# Patient Record
Sex: Female | Born: 1990 | Race: White | Hispanic: No | Marital: Married | Smoking: Never smoker
Health system: Southern US, Community
[De-identification: ages and names within clinical notes are randomized; demographics above are authoritative.]

## PROBLEM LIST (undated history)

## (undated) DIAGNOSIS — F419 Anxiety disorder, unspecified: Secondary | ICD-10-CM

## (undated) DIAGNOSIS — N189 Chronic kidney disease, unspecified: Secondary | ICD-10-CM

## (undated) DIAGNOSIS — K219 Gastro-esophageal reflux disease without esophagitis: Secondary | ICD-10-CM

## (undated) DIAGNOSIS — F319 Bipolar disorder, unspecified: Secondary | ICD-10-CM

## (undated) DIAGNOSIS — T7840XA Allergy, unspecified, initial encounter: Secondary | ICD-10-CM

## (undated) DIAGNOSIS — I10 Essential (primary) hypertension: Secondary | ICD-10-CM

## (undated) HISTORY — PX: THYROID CYST EXCISION: SHX2511

## (undated) HISTORY — DX: Essential (primary) hypertension: I10

## (undated) HISTORY — DX: Anxiety disorder, unspecified: F41.9

## (undated) HISTORY — DX: Chronic kidney disease, unspecified: N18.9

## (undated) HISTORY — DX: Allergy, unspecified, initial encounter: T78.40XA

---

## 2013-07-03 ENCOUNTER — Other Ambulatory Visit (HOSPITAL_COMMUNITY)
Admission: RE | Admit: 2013-07-03 | Discharge: 2013-07-03 | Disposition: A | Discharge status: Home / Self Care | Payer: Self-pay | Source: Ambulatory Visit | Attending: Family Medicine | Admitting: Family Medicine

## 2013-07-03 ENCOUNTER — Other Ambulatory Visit: Payer: Self-pay | Admitting: Family Medicine

## 2013-07-03 DIAGNOSIS — Z0000 Encounter for general adult medical examination without abnormal findings: Secondary | ICD-10-CM | POA: Insufficient documentation

## 2015-12-01 DIAGNOSIS — F302 Manic episode, severe with psychotic symptoms: Secondary | ICD-10-CM | POA: Insufficient documentation

## 2016-10-30 ENCOUNTER — Encounter (HOSPITAL_COMMUNITY): Payer: Self-pay | Admitting: Emergency Medicine

## 2016-10-30 ENCOUNTER — Emergency Department (HOSPITAL_COMMUNITY)
Admission: EM | Admit: 2016-10-30 | Discharge: 2016-10-31 | Disposition: A | Discharge status: Home / Self Care | Payer: BLUE CROSS/BLUE SHIELD | Attending: Emergency Medicine | Admitting: Emergency Medicine

## 2016-10-30 DIAGNOSIS — F3110 Bipolar disorder, current episode manic without psychotic features, unspecified: Secondary | ICD-10-CM | POA: Diagnosis not present

## 2016-10-30 DIAGNOSIS — R45851 Suicidal ideations: Secondary | ICD-10-CM | POA: Diagnosis not present

## 2016-10-30 DIAGNOSIS — Z046 Encounter for general psychiatric examination, requested by authority: Secondary | ICD-10-CM | POA: Diagnosis present

## 2016-10-30 DIAGNOSIS — Z5181 Encounter for therapeutic drug level monitoring: Secondary | ICD-10-CM | POA: Diagnosis not present

## 2016-10-30 DIAGNOSIS — F1099 Alcohol use, unspecified with unspecified alcohol-induced disorder: Secondary | ICD-10-CM | POA: Diagnosis not present

## 2016-10-30 DIAGNOSIS — F3160 Bipolar disorder, current episode mixed, unspecified: Secondary | ICD-10-CM | POA: Diagnosis present

## 2016-10-30 DIAGNOSIS — F3163 Bipolar disorder, current episode mixed, severe, without psychotic features: Secondary | ICD-10-CM | POA: Diagnosis not present

## 2016-10-30 DIAGNOSIS — Z79899 Other long term (current) drug therapy: Secondary | ICD-10-CM | POA: Diagnosis not present

## 2016-10-30 DIAGNOSIS — F319 Bipolar disorder, unspecified: Secondary | ICD-10-CM

## 2016-10-30 LAB — COMPREHENSIVE METABOLIC PANEL
ALT: 17 U/L (ref 14–54)
AST: 23 U/L (ref 15–41)
Albumin: 3.8 g/dL (ref 3.5–5.0)
Alkaline Phosphatase: 44 U/L (ref 38–126)
Anion gap: 7 (ref 5–15)
BUN: 11 mg/dL (ref 6–20)
CO2: 27 mmol/L (ref 22–32)
Calcium: 8.6 mg/dL — ABNORMAL LOW (ref 8.9–10.3)
Chloride: 103 mmol/L (ref 101–111)
Creatinine, Ser: 0.6 mg/dL (ref 0.44–1.00)
GFR calc Af Amer: >60 mL/min (ref >60)
GFR calc non Af Amer: >60 mL/min (ref >60)
Glucose, Bld: 110 mg/dL — ABNORMAL HIGH (ref 65–99)
Potassium: 3.5 mmol/L (ref 3.5–5.1)
Sodium: 137 mmol/L (ref 135–145)
Total Bilirubin: 0.4 mg/dL (ref 0.3–1.2)
Total Protein: 6.5 g/dL (ref 6.5–8.1)

## 2016-10-30 LAB — ETHANOL: Alcohol, Ethyl (B): <5 mg/dL (ref <5)

## 2016-10-30 LAB — RAPID URINE DRUG SCREEN, HOSP PERFORMED
Amphetamines: NOT DETECTED
Barbiturates: NOT DETECTED
Benzodiazepines: POSITIVE — AB
Cocaine: NOT DETECTED
Opiates: NOT DETECTED
Tetrahydrocannabinol: NOT DETECTED

## 2016-10-30 LAB — PREGNANCY, URINE: Preg Test, Ur: NEGATIVE

## 2016-10-30 LAB — CBC
HCT: 37.1 % (ref 36.0–46.0)
Hemoglobin: 12.9 g/dL (ref 12.0–15.0)
MCH: 30.4 pg (ref 26.0–34.0)
MCHC: 34.8 g/dL (ref 30.0–36.0)
MCV: 87.5 fL (ref 78.0–100.0)
Platelets: 179 10*3/uL (ref 150–400)
RBC: 4.24 MIL/uL (ref 3.87–5.11)
RDW: 12.9 % (ref 11.5–15.5)
WBC: 5.2 10*3/uL (ref 4.0–10.5)

## 2016-10-30 LAB — SALICYLATE LEVEL: Salicylate Lvl: <7 mg/dL (ref 2.8–30.0)

## 2016-10-30 LAB — TSH: TSH: 1.184 u[IU]/mL (ref 0.350–4.500)

## 2016-10-30 LAB — ACETAMINOPHEN LEVEL: Acetaminophen (Tylenol), Serum: <10 ug/mL — ABNORMAL LOW (ref 10–30)

## 2016-10-30 MED ORDER — ONDANSETRON HCL 4 MG PO TABS: 4.0000 mg | ORAL_TABLET | Freq: Three times a day (TID) | ORAL | Status: DC | PRN

## 2016-10-30 MED ORDER — IBUPROFEN 200 MG PO TABS: 600.0000 mg | ORAL_TABLET | Freq: Three times a day (TID) | ORAL | Status: DC | PRN

## 2016-10-30 MED ORDER — ACETAMINOPHEN 325 MG PO TABS: 650.0000 mg | ORAL_TABLET | ORAL | Status: DC | PRN

## 2016-10-30 MED ORDER — NICOTINE 21 MG/24HR TD PT24: 21.0000 mg | MEDICATED_PATCH | Freq: Every day | TRANSDERMAL | Status: DC

## 2016-10-30 MED ORDER — ZOLPIDEM TARTRATE 5 MG PO TABS: 5.0000 mg | ORAL_TABLET | Freq: Every evening | ORAL | Status: DC | PRN

## 2016-10-30 NOTE — ED Triage Notes (Signed)
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 She states that recently she began feeling very homesick and noticing a change in her behaviors towards her roommate. Pt is tearful with slurred speech. She states she is unsure what is making her have these behaviors. Pt reports family history of bipolar disorder.

## 2016-10-30 NOTE — ED Provider Notes (Signed)
[facility]-EMERGENCY DEPT Provider Note   CSN: [id number] Arrival date & time: 10/30/16  1912     History   Chief Complaint Chief Complaint  Patient presents with  . Psychiatric Evaluation    HPI Tracy Russo is a 25 y.o. female.  HPI  25 year old female brought in for psychiatric evaluation. 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 She tells she's not sure what really happened what was a part of dreams but she was diagnosed with bipolar disorder. She states she doesn't still feel right and her parents encourage her to be checked out here and her doctor that was on the trip with her told her she needed to be admitted to the psychiatric unit. She denies suicidal ideations. She states she is on a medicine for bipolar disorder but doesn't know what it is. Her mom has been giving her the pills. History is limited as the patient is very tangential and does not stay on topic. States she does not feel acutely ill, no other complaints  History reviewed. No pertinent past medical history.  There are no active problems to display for this patient.   History reviewed. No pertinent surgical history.  OB History    No data available       Home Medications    Prior to Admission medications   Not on File    Family History No family history on file.  Social History Social History  Substance Use Topics  . Smoking status: Never Smoker  . Smokeless tobacco: Never Used  . Alcohol use Yes     Comment: occasional drinker, less than 2 drinks per week     Allergies   Patient has no known allergies.   Review of Systems Review of Systems  Constitutional: Negative for fever.  Respiratory: Negative for shortness of breath.   Cardiovascular: Negative for chest pain.  Gastrointestinal: Negative for vomiting.  Psychiatric/Behavioral: Positive for behavioral problems. The patient is  nervous/anxious.   All other systems reviewed and are negative.    Physical Exam Updated Vital Signs BP 111/87 (BP Location: Right Arm)   Pulse 92   Temp 98.5 F (36.9 C) (Oral)   Resp 20   Ht 5\' 7"  (1.702 m)   Wt 147 lb 9 oz (66.9 kg)   LMP 10/14/2016   SpO2 100%   BMI 23.11 kg/m   Physical Exam  Constitutional: She is oriented to person, place, and time. She appears well-developed and well-nourished. No distress.  HENT:  Head: Normocephalic and atraumatic.  Right Ear: External ear normal.  Left Ear: External ear normal.  Nose: Nose normal.  Eyes: Right eye exhibits no discharge. Left eye exhibits no discharge.  Cardiovascular: Normal rate, regular rhythm and normal heart sounds.   Pulmonary/Chest: Effort normal and breath sounds normal.  Abdominal: Soft. There is no tenderness.  Neurological: She is alert and oriented to person, place, and time.  Skin: Skin is warm and dry. She is not diaphoretic.  Psychiatric: Her mood appears anxious. Her speech is rapid and/or pressured and tangential. Her speech is not slurred. She expresses no suicidal ideation.  Nursing note and vitals reviewed.    ED Treatments / Results  Labs (all labs ordered are listed, but only abnormal results are displayed) Labs Reviewed  COMPREHENSIVE METABOLIC PANEL - Abnormal; Notable for the following:       Result Value  Glucose, Bld 110 (*)    Calcium 8.6 (*)    All other components within normal limits  RAPID URINE DRUG SCREEN, HOSP PERFORMED - Abnormal; Notable for the following:    Benzodiazepines POSITIVE (*)    All other components within normal limits  ACETAMINOPHEN LEVEL - Abnormal; Notable for the following:    Acetaminophen (Tylenol), Serum <10 (*)    All other components within normal limits  ETHANOL  CBC  SALICYLATE LEVEL  TSH  PREGNANCY, URINE  POC URINE PREG, ED    EKG  EKG Interpretation None       Radiology No results found.  Procedures Procedures (including  critical care time)  Medications Ordered in ED Medications  ondansetron (ZOFRAN) tablet 4 mg (not administered)  nicotine (NICODERM CQ - dosed in mg/24 hours) patch 21 mg (not administered)  zolpidem (AMBIEN) tablet 5 mg (not administered)  ibuprofen (ADVIL,MOTRIN) tablet 600 mg (not administered)  acetaminophen (TYLENOL) tablet 650 mg (not administered)     Initial Impression / Assessment and Plan / ED Course  I have reviewed the triage vital signs and the nursing notes.  Pertinent labs & imaging results that were available during my care of the patient were reviewed by me and considered in my medical decision making (see chart for details).  Clinical Course as of Oct 31 6  Fri Oct 30, 2016  2111 Consult psych. Overall appears well but appears manic with pressured rapid speech and tangential thoughts. No SI  [SG]  2238 Screening labs unremarkable. Patient will be placed in psych holding while awaiting TTS.  [SG]    Clinical Course User Index [SG] Pricilla LovelessScott Akshara Blumenthal, MD    I believe patient has mania/bipolar disorder. She is stable. Psych consult/dispo per them.  Final Clinical Impressions(s) / ED Diagnoses   Final diagnoses:  None    New Prescriptions New Prescriptions   No medications on file     Pricilla LovelessScott Niccolo Burggraf, MD 10/31/16 0007

## 2016-10-30 NOTE — ED Notes (Signed)
Pt. In burgundy scrubs. Pt. And belongings wanded by security. Pt. Has 1 belongings bag. Pt. Has 1 pr. Black shoes, 1 gray/ purple sweater, 1 white/black sports bra, 1 black t-shirt, 1 jean, and 1 pr black socks. Pt. Belongings locked up in [employer]. Pt. Has glasses at bedside. No wallet, no money and no cell phone.

## 2016-10-30 NOTE — ED Notes (Signed)
Pt. Transferred to [facility] from ED to room 36 after screening for contraband. Report to include Situation, Background, Assessment and Recommendations from RN. Pt. Oriented to unit including Q15 minute rounds as well as the security cameras for their protection. Patient is alert and oriented, warm and dry in no acute distress. Patient denies SI, HI, and AVH. Pt. Encouraged to let me know if needs arise. 

## 2016-10-30 NOTE — BH Assessment (Addendum)
Assessment Note  Tracy BachKaren H Russo is an 25 y.o. female.   Patient reports being brought into the ED by parents (mother and father) because of changes in behaviors.  Patient was sleep upon entering her room but woke enough to answer some questions.  Patient never opened her eyes and she rambled with Russo muffled voice.  She reports recently returning from [place] where she experienced Russo behavior change and diagnosed with Bipolar disorder by Russo doctor.  She reports getting in Russo verbal altercation with her roommate later becoming very nervous. She denies history of mental health treatment although reporting some symptoms of increased moods then low moods. She reports mother has Russo diagnosis of Bipolar Disorder and is currently concerned the patient has similar behaviors.   Patient denies SI/HI and self-injurious behaviors.  She reports struggling with distinguishing reality.    This writer attempted to contact the patient's only emergency contact with no success.    This Clinical research associatewriter consulted with Barbara CowerJason, FNP it is recommended to refer for inpatient hospitalization for stabilization and safety.     Diagnosis: Mood disorder, unspecified  Past Medical History: History reviewed. No pertinent past medical history.  History reviewed. No pertinent surgical history.  Family History: No family history on file.  Social History:  reports that she has never smoked. She has never used smokeless tobacco. She reports that she drinks alcohol. Her drug history is not on file.  Additional Social History:  Alcohol / Drug Use Pain Medications: see chart Prescriptions: see chart Over the Counter: see chart History of alcohol / drug use?: No history of alcohol / drug abuse (none reported)  CIWA: CIWA-Ar BP: 111/87 Pulse Rate: 92 COWS:    Allergies: No Known Allergies  Home Medications:  (Not in Russo hospital admission)  OB/GYN Status:  Patient's last menstrual period was 10/14/2016.  General Assessment Data Location  of Assessment: [facility] ED TTS Assessment: In system Is this Russo Tele or Face-to-Face Assessment?: Face-to-Face Is this an Initial Assessment or Russo Re-assessment for this encounter?: Initial Assessment Marital status: Single Maiden name: na Is patient pregnant?: No Pregnancy Status: No Living Arrangements: Parent Can pt return to current living arrangement?: Yes Admission Status: Voluntary Is patient capable of signing voluntary admission?: Yes Referral Source: Self/Family/Friend Insurance type: none  Medical Screening Exam [facility] Walk[facility] ONLY) Medical Exam completed: Yes  Crisis Care Plan Living Arrangements: Parent Name of Psychiatrist: none reported Name of Therapist: none reported  Education Status Is patient currently in school?:  (unknown) Highest grade of school patient has completed: unknown Name of school: unknown Contact person: na  Risk to self with the past 6 months Suicidal Ideation: No-Not Currently/Within Last 6 Months Has patient been Russo risk to self within the past 6 months prior to admission? : No Suicidal Intent: No-Not Currently/Within Last 6 Months Has patient had any suicidal intent within the past 6 months prior to admission? : No Is patient at risk for suicide?: No Suicidal Plan?: No-Not Currently/Within Last 6 Months Has patient had any suicidal plan within the past 6 months prior to admission? : No Access to Means: No What has been your use of drugs/alcohol within the last 12 months?: none reported Previous Attempts/Gestures: No (none reported) How many times?: 0 Other Self Harm Risks: none reported Intentional Self Injurious Behavior: None Family Suicide History: Unknown Recent stressful life event(s): Other (Comment), Loss (Comment), Conflict (Comment) (Recently returned from [place]) Persecutory voices/beliefs?: No Depression: Yes Depression Symptoms: Feeling angry/irritable, Isolating, Tearfulness Substance abuse history  and/or treatment for substance  abuse?: No  Risk to Others within the past 6 months Homicidal Ideation: No-Not Currently/Within Last 6 Months Does patient have any lifetime risk of violence toward others beyond the six months prior to admission? : No Thoughts of Harm to Others: No-Not Currently Present/Within Last 6 Months Current Homicidal Intent: No-Not Currently/Within Last 6 Months Current Homicidal Plan: No-Not Currently/Within Last 6 Months Access to Homicidal Means: No Identified Victim: na History of harm to others?: No Assessment of Violence: None Noted Violent Behavior Description: none reported Does patient have access to weapons?: No Criminal Charges Pending?: No Does patient have Russo court date: No Is patient on probation?: No  Psychosis Hallucinations:  (Pt reports unable to tell reality anymore) Delusions:  (unable to assess)  Mental Status Report Appearance/Hygiene: In scrubs Eye Contact: Poor Motor Activity: Freedom of movement Speech: Slurred, Rapid, Pressured Level of Consciousness: Sleeping, Drowsy Mood: Anxious Affect: Anxious Anxiety Level: Moderate Thought Processes: Tangential Judgement: Impaired Orientation: Person Obsessive Compulsive Thoughts/Behaviors: None  Cognitive Functioning Concentration: Poor Memory: Unable to Assess IQ: Average Insight: Poor Impulse Control: Poor Appetite: Poor Weight Loss: 0 Sleep: Increased Total Hours of Sleep: 9 Vegetative Symptoms: None  ADLScreening [facility] Assessment Services) Patient's cognitive ability adequate to safely complete daily activities?: Yes Patient able to express need for assistance with ADLs?: Yes Independently performs ADLs?: Yes (appropriate for developmental age)  Prior Inpatient Therapy Prior Inpatient Therapy: No  Prior Outpatient Therapy Prior Outpatient Therapy: No Does patient have an ACCT team?: No Does patient have Intensive In-House Services?  : No Does patient have [facility] services? : No Does patient have  P4CC services?: No  ADL Screening (condition at time of admission) Patient's cognitive ability adequate to safely complete daily activities?: Yes Patient able to express need for assistance with ADLs?: Yes Independently performs ADLs?: Yes (appropriate for developmental age)       Abuse/Neglect Assessment (Assessment to be complete while patient is alone) Physical Abuse: Denies Verbal Abuse: Denies Sexual Abuse: Denies Exploitation of patient/patient's resources: Denies Self-Neglect: Denies Values / Beliefs Cultural Requests During Hospitalization: None Spiritual Requests During Hospitalization: None Consults Spiritual Care Consult Needed: No Social Work Consult Needed: No Merchant navy officerAdvance Directives (For Healthcare) Does Patient Have Russo Medical Advance Directive?: No    Additional Information 1:1 In Past 12 Months?: No CIRT Risk: No Elopement Risk: No Does patient have medical clearance?: Yes     Disposition:  Disposition Initial Assessment Completed for this Encounter: Yes Disposition of Patient: Inpatient treatment program Type of inpatient treatment program: Adult  On Site Evaluation by:   Reviewed with Physician:    Maryelizabeth Rowanorbett, Tracy Russo 10/30/2016 11:25 PM

## 2016-10-31 ENCOUNTER — Encounter (HOSPITAL_COMMUNITY): Payer: Self-pay | Admitting: *Deleted

## 2016-10-31 ENCOUNTER — Inpatient Hospital Stay (HOSPITAL_COMMUNITY)
Admission: AD | Admit: 2016-10-31 | Discharge: 2016-11-15 | DRG: 885 | Disposition: A | Discharge status: Home / Self Care | Payer: BLUE CROSS/BLUE SHIELD | Source: Intra-hospital | Attending: Psychiatry | Admitting: Psychiatry

## 2016-10-31 DIAGNOSIS — F3162 Bipolar disorder, current episode mixed, moderate: Secondary | ICD-10-CM | POA: Diagnosis not present

## 2016-10-31 DIAGNOSIS — Z818 Family history of other mental and behavioral disorders: Secondary | ICD-10-CM

## 2016-10-31 DIAGNOSIS — Z79899 Other long term (current) drug therapy: Secondary | ICD-10-CM | POA: Diagnosis not present

## 2016-10-31 DIAGNOSIS — R45851 Suicidal ideations: Secondary | ICD-10-CM | POA: Diagnosis not present

## 2016-10-31 DIAGNOSIS — F3110 Bipolar disorder, current episode manic without psychotic features, unspecified: Secondary | ICD-10-CM | POA: Diagnosis not present

## 2016-10-31 DIAGNOSIS — F428 Other obsessive-compulsive disorder: Secondary | ICD-10-CM | POA: Diagnosis present

## 2016-10-31 DIAGNOSIS — F429 Obsessive-compulsive disorder, unspecified: Secondary | ICD-10-CM | POA: Diagnosis not present

## 2016-10-31 DIAGNOSIS — F312 Bipolar disorder, current episode manic severe with psychotic features: Secondary | ICD-10-CM | POA: Diagnosis present

## 2016-10-31 DIAGNOSIS — F319 Bipolar disorder, unspecified: Secondary | ICD-10-CM | POA: Clinically undetermined

## 2016-10-31 DIAGNOSIS — F3160 Bipolar disorder, current episode mixed, unspecified: Secondary | ICD-10-CM | POA: Diagnosis present

## 2016-10-31 DIAGNOSIS — K5900 Constipation, unspecified: Secondary | ICD-10-CM | POA: Diagnosis not present

## 2016-10-31 DIAGNOSIS — K649 Unspecified hemorrhoids: Secondary | ICD-10-CM | POA: Diagnosis present

## 2016-10-31 DIAGNOSIS — F411 Generalized anxiety disorder: Secondary | ICD-10-CM | POA: Diagnosis present

## 2016-10-31 DIAGNOSIS — F3163 Bipolar disorder, current episode mixed, severe, without psychotic features: Secondary | ICD-10-CM | POA: Diagnosis not present

## 2016-10-31 DIAGNOSIS — F1099 Alcohol use, unspecified with unspecified alcohol-induced disorder: Secondary | ICD-10-CM

## 2016-10-31 DIAGNOSIS — Z8249 Family history of ischemic heart disease and other diseases of the circulatory system: Secondary | ICD-10-CM

## 2016-10-31 MED ORDER — MAGNESIUM HYDROXIDE 400 MG/5ML PO SUSP
30.0000 mL | Freq: Every day | ORAL | Status: DC | PRN
  Administered 2016-11-01 – 2016-11-06 (×2): 30 mL via ORAL
  Filled 2016-10-31 (×2): qty 30

## 2016-10-31 MED ORDER — ALUM & MAG HYDROXIDE-SIMETH 200-200-20 MG/5ML PO SUSP: 30.0000 mL | ORAL | Status: DC | PRN

## 2016-10-31 MED ORDER — INFLUENZA VAC SPLIT QUAD 0.5 ML IM SUSY
0.5000 mL | PREFILLED_SYRINGE | INTRAMUSCULAR | Status: DC
  Filled 2016-10-31: qty 0.5

## 2016-10-31 MED ORDER — QUETIAPINE FUMARATE 25 MG PO TABS
25.0000 mg | ORAL_TABLET | Freq: Three times a day (TID) | ORAL | Status: DC
  Administered 2016-10-31 – 2016-11-02 (×6): 25 mg via ORAL
  Filled 2016-10-31 (×11): qty 1

## 2016-10-31 MED ORDER — QUETIAPINE FUMARATE 25 MG PO TABS
25.0000 mg | ORAL_TABLET | Freq: Three times a day (TID) | ORAL | Status: DC
  Administered 2016-10-31 (×2): 25 mg via ORAL
  Filled 2016-10-31 (×2): qty 1

## 2016-10-31 MED ORDER — NICOTINE 21 MG/24HR TD PT24
21.0000 mg | MEDICATED_PATCH | Freq: Every day | TRANSDERMAL | Status: DC
  Administered 2016-11-01 – 2016-11-12 (×7): 21 mg via TRANSDERMAL
  Filled 2016-10-31 (×18): qty 1

## 2016-10-31 MED ORDER — WHITE PETROLATUM GEL
Status: DC | PRN
  Administered 2016-10-31: 0.2 via TOPICAL
  Filled 2016-10-31: qty 28.35

## 2016-10-31 MED ORDER — WHITE PETROLATUM GEL: Status: DC | PRN

## 2016-10-31 MED ORDER — ONDANSETRON HCL 4 MG PO TABS
4.0000 mg | ORAL_TABLET | Freq: Three times a day (TID) | ORAL | Status: DC | PRN
  Administered 2016-11-03 – 2016-11-12 (×2): 4 mg via ORAL
  Filled 2016-10-31 (×3): qty 1

## 2016-10-31 MED ORDER — IBUPROFEN 600 MG PO TABS
600.0000 mg | ORAL_TABLET | Freq: Three times a day (TID) | ORAL | Status: DC | PRN
  Administered 2016-11-12: 600 mg via ORAL
  Filled 2016-10-31: qty 1

## 2016-10-31 MED ORDER — QUETIAPINE FUMARATE 25 MG PO TABS
ORAL_TABLET | ORAL | Status: AC
  Filled 2016-10-31: qty 1

## 2016-10-31 NOTE — ED Notes (Signed)
Patient noted sleeping in room. No complaints, stable, in no acute distress. Q15 minute rounds and monitoring via Security Cameras to continue.  

## 2016-10-31 NOTE — ED Notes (Signed)
Pt transported to [facility] by [facility] transportation. All belongings returned to pt who signed for same.  

## 2016-10-31 NOTE — Tx Team (Signed)
Initial Treatment Plan 10/31/2016 8:15 PM Tracy Russo AVW:[id number]    PATIENT STRESSORS: Other: Psychosis   PATIENT STRENGTHS: Communication skills Motivation for treatment/growth Physical Health Supportive family/friends   PATIENT IDENTIFIED PROBLEMS: Psychosis  Anxiety  "Deal with anxiety"  "New coping mechanism"  "Better people skills"             DISCHARGE CRITERIA:  Ability to meet basic life and health needs Improved stabilization in mood, thinking, and/or behavior Motivation to continue treatment in a less acute level of care Need for constant or close observation no longer present Verbal commitment to aftercare and medication compliance  PRELIMINARY DISCHARGE PLAN: Outpatient therapy Return to previous living arrangement  PATIENT/FAMILY INVOLVEMENT: This treatment plan has been presented to and reviewed with the patient, Tracy Russo.  The patient and family have been given the opportunity to ask questions and make suggestions.  Carleene OverlieMiddleton, Prakash Kimberling P, RN 10/31/2016, 8:15 PM

## 2016-10-31 NOTE — Consult Note (Signed)
[facility] Consult   Reason for Consult:  Mood swings and bizarre behaviors Referring Physician:  EDP Patient Identification: Tracy Russo MRN:  [id number] Principal Diagnosis: <principal problem not specified> Diagnosis:  There are no active problems to display for this patient.   Total Time spent with patient: 45 minutes  Subjective:   Tracy Russo is a 25 y.o. female patient admitted with bipolar mood swings and benzo's abuse.  HPI:  Tracy Russo is an 25 y.o. female seen at [facility] long emergency department, case discussed with the physician extender, staff RN for this face-to-face psychiatric consultation. Patient presented with mood swings, disturbed sleep, rambling speech, tangential and circumstantial thought process and racing thoughts. Patient becomes nonfunctional since came from [place]. 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 Patient is also has a verbal altercation with her roommate. Patient denies active suicidal/homicidal ideation and auditory or visual hallucinations..  Please review the following behavioral health assessment for more details. Patient reports being brought into the ED by parents (mother and father) because of changes in behaviors.  Patient was sleep upon entering her room but woke enough to answer some questions.  Patient never opened her eyes and she rambled with a muffled voice.  She reports recently returning from [place] where she experienced a behavior change and diagnosed with Bipolar disorder by a doctor.  She reports getting in a verbal altercation with her roommate later becoming very nervous. She denies history of mental health treatment although reporting some symptoms of increased moods then low moods. She reports mother has a diagnosis of Bipolar Disorder and is currently concerned the patient has similar behaviors.   Patient denies SI/HI and self-injurious behaviors.  She reports  struggling with distinguishing reality.    This writer attempted to contact the patient's only emergency contact with no success.    This Probation officer consulted with Corene Cornea, [place] it is recommended to refer for inpatient hospitalization for stabilization and safety.     Diagnosis: Mood disorder, unspecified  Past Medical History: History reviewed. No pertinent past medical history.  History reviewed. No pertinent surgical history.  Family History: No family history on file.  Social History:  reports that she has never smoked. She has never used smokeless tobacco. She reports that she drinks alcohol. Her drug history is not on file.  Past Psychiatric History: None reported.  Risk to Self: Suicidal Ideation: No-Not Currently/Within Last 6 Months Suicidal Intent: No-Not Currently/Within Last 6 Months Is patient at risk for suicide?: No Suicidal Plan?: No-Not Currently/Within Last 6 Months Access to Means: No What has been your use of drugs/alcohol within the last 12 months?: none reported How many times?: 0 Other Self Harm Risks: none reported Intentional Self Injurious Behavior: None Risk to Others: Homicidal Ideation: No-Not Currently/Within Last 6 Months Thoughts of Harm to Others: No-Not Currently Present/Within Last 6 Months Current Homicidal Intent: No-Not Currently/Within Last 6 Months Current Homicidal Plan: No-Not Currently/Within Last 6 Months Access to Homicidal Means: No Identified Victim: na History of harm to others?: No Assessment of Violence: None Noted Violent Behavior Description: none reported Does patient have access to weapons?: No Criminal Charges Pending?: No Does patient have a court date: No Prior Inpatient Therapy: Prior Inpatient Therapy: No Prior Outpatient Therapy: Prior Outpatient Therapy: No Does patient have an ACCT team?: No Does patient have Intensive In-House Services?  : No Does patient have [facility] services? : No Does patient have P4[facility]  services?: No  Past Medical History: History reviewed. No pertinent past medical history. History reviewed. No pertinent surgical history. Family History: No family history on file. Family Psychiatric  History: Bipolar disorder Social History:  History  Alcohol Use  . Yes    Comment: occasional drinker, less than 2 drinks per week     History  Drug use: Unknown    Social History   Social History  . Marital status: Single    Spouse name: N/A  . Number of children: N/A  . Years of education: N/A   Social History Main Topics  . Smoking status: Never Smoker  . Smokeless tobacco: Never Used  . Alcohol use Yes     Comment: occasional drinker, less than 2 drinks per week  . Drug use: Unknown  . Sexual activity: Not Asked   Other Topics Concern  . None   Social History Narrative  . None   Additional Social History:    Allergies:  No Known Allergies  Labs:  Results for orders placed or performed during the hospital encounter of 10/30/16 (from the past 48 hour(s))  Rapid urine drug screen (hospital performed)     Status: Abnormal   Collection Time: 10/30/16  8:29 PM  Result Value Ref Range   Opiates NONE DETECTED NONE DETECTED   Cocaine NONE DETECTED NONE DETECTED   Benzodiazepines POSITIVE (A) NONE DETECTED   Amphetamines NONE DETECTED NONE DETECTED   Tetrahydrocannabinol NONE DETECTED NONE DETECTED   Barbiturates NONE DETECTED NONE DETECTED    Comment:        DRUG SCREEN FOR MEDICAL PURPOSES ONLY.  IF CONFIRMATION IS NEEDED FOR ANY PURPOSE, NOTIFY LAB WITHIN 5 DAYS.        LOWEST DETECTABLE LIMITS FOR URINE DRUG SCREEN Drug Class       Cutoff (ng/mL) Amphetamine      1000 Barbiturate      200 Benzodiazepine   427 Tricyclics       062 Opiates          300 Cocaine          300 THC              50   Pregnancy, urine     Status: None   Collection Time: 10/30/16  8:30 PM  Result Value Ref Range   Preg Test, Ur NEGATIVE NEGATIVE    Comment:        THE  SENSITIVITY OF THIS METHODOLOGY IS >20 mIU/mL.   Comprehensive metabolic panel     Status: Abnormal   Collection Time: 10/30/16  9:44 PM  Result Value Ref Range   Sodium 137 135 - 145 mmol/L   Potassium 3.5 3.5 - 5.1 mmol/L   Chloride 103 101 - 111 mmol/L   CO2 27 22 - 32 mmol/L   Glucose, Bld 110 (H) 65 - 99 mg/dL   BUN 11 6 - 20 mg/dL   Creatinine, Ser 0.60 0.44 - 1.00 mg/dL   Calcium 8.6 (L) 8.9 - 10.3 mg/dL   Total Protein 6.5 6.5 - 8.1 g/dL   Albumin 3.8 3.5 - 5.0 g/dL   AST 23 15 - 41 U/L   ALT 17 14 - 54 U/L   Alkaline Phosphatase 44 38 - 126 U/L   Total Bilirubin 0.4 0.3 - 1.2 mg/dL   GFR calc non Af Amer >60 >60 mL/min   GFR calc Af Amer >60 >60 mL/min    Comment: (NOTE) The eGFR has been calculated using the CKD EPI equation.  This calculation has not been validated in all clinical situations. eGFR's persistently <60 mL/min signify possible Chronic Kidney Disease.    Anion gap 7 5 - 15  Ethanol     Status: None   Collection Time: 10/30/16  9:44 PM  Result Value Ref Range   Alcohol, Ethyl (B) <5 <5 mg/dL    Comment:        LOWEST DETECTABLE LIMIT FOR SERUM ALCOHOL IS 5 mg/dL FOR MEDICAL PURPOSES ONLY   cbc     Status: None   Collection Time: 10/30/16  9:44 PM  Result Value Ref Range   WBC 5.2 4.0 - 10.5 K/uL   RBC 4.24 3.87 - 5.11 MIL/uL   Hemoglobin 12.9 12.0 - 15.0 g/dL   HCT 37.1 36.0 - 46.0 %   MCV 87.5 78.0 - 100.0 fL   MCH 30.4 26.0 - 34.0 pg   MCHC 34.8 30.0 - 36.0 g/dL   RDW 12.9 11.5 - 15.5 %   Platelets 179 150 - 400 K/uL  Acetaminophen level     Status: Abnormal   Collection Time: 10/30/16  9:45 PM  Result Value Ref Range   Acetaminophen (Tylenol), Serum <10 (L) 10 - 30 ug/mL    Comment:        THERAPEUTIC CONCENTRATIONS VARY SIGNIFICANTLY. A RANGE OF 10-30 ug/mL MAY BE AN EFFECTIVE CONCENTRATION FOR MANY PATIENTS. HOWEVER, SOME ARE BEST TREATED AT CONCENTRATIONS OUTSIDE THIS RANGE. ACETAMINOPHEN CONCENTRATIONS >150 ug/mL AT 4 HOURS  AFTER INGESTION AND >50 ug/mL AT 12 HOURS AFTER INGESTION ARE OFTEN ASSOCIATED WITH TOXIC REACTIONS.   Salicylate level     Status: None   Collection Time: 10/30/16  9:45 PM  Result Value Ref Range   Salicylate Lvl <5.9 2.8 - 30.0 mg/dL  TSH     Status: None   Collection Time: 10/30/16  9:45 PM  Result Value Ref Range   TSH 1.184 0.350 - 4.500 uIU/mL    Comment: Performed by a 3rd Generation assay with a functional sensitivity of <=0.01 uIU/mL.    Current Facility-Administered Medications  Medication Dose Route Frequency Provider Last Rate Last Dose  . acetaminophen (TYLENOL) tablet 650 mg  650 mg Oral Q4H PRN Sherwood Gambler, MD      . ibuprofen (ADVIL,MOTRIN) tablet 600 mg  600 mg Oral Q8H PRN Sherwood Gambler, MD      . nicotine (NICODERM CQ - dosed in mg/24 hours) patch 21 mg  21 mg Transdermal Daily Sherwood Gambler, MD      . ondansetron (ZOFRAN) tablet 4 mg  4 mg Oral Q8H PRN Sherwood Gambler, MD      . QUEtiapine (SEROQUEL) tablet 25 mg  25 mg Oral TID Ambrose Finland, MD      . white petrolatum (VASELINE) gel   Topical PRN Benjamine Mola, FNP      . zolpidem (AMBIEN) tablet 5 mg  5 mg Oral QHS PRN Sherwood Gambler, MD       No current outpatient prescriptions on file.    Musculoskeletal: Strength & Muscle Tone: within normal limits Gait & Station: normal Patient leans: N/A  Psychiatric Specialty Exam: Physical Exam Full physical performed in Emergency Department. I have reviewed this assessment and concur with its findings.   ROS depression, mood swings, lack of sleep and disturbed appetite and racing thoughts. No Fever-chills, No Headache, No changes with Vision or hearing, reports vertigo No problems swallowing food or Liquids, No Chest pain, Cough or Shortness of Breath, No Abdominal pain, No Nausea  or Vommitting, Bowel movements are regular, No Blood in stool or Urine, No dysuria, No new skin rashes or bruises, No new joints pains-aches,  No new weakness,  tingling, numbness in any extremity, No recent weight gain or loss, No polyuria, polydypsia or polyphagia,   A full 10 point Review of Systems was done, except as stated above, all other Review of Systems were negative.  Blood pressure 127/73, pulse 86, temperature 98.1 F (36.7 C), temperature source Oral, resp. rate 16, height _0  (1.702 m), weight 66.9 kg (147 lb 9 oz), last menstrual period 10/14/2016, SpO2 99 %.Body mass index is 23.11 kg/m.  General Appearance: Bizarre and Disheveled  Eye Contact:  Fair  Speech:  Pressured  Volume:  Normal  Mood:  Anxious and Depressed  Affect:  Labile  Thought Process:  Irrelevant  Orientation:  Full (Time, Place, and Person)  Thought Content:  Rumination and Tangential  Suicidal Thoughts:  Yes.  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 Patient cannot function herself for the last few weeks and required hospital visit for stabilization. Patient has no previous history of psychiatric treatment but has a history of bipolar disorder in the family.  Patient meet criteria for acute psychiatric hospitalization referred to the TTS for seeking inpatient  psychiatric hospitalization. We start Seroquel 25 mg 3 times daily for mood swings and Anxiety Daily contact with patient to assess and evaluate symptoms and progress in treatment and Medication management  Disposition: Recommend psychiatric Inpatient admission when medically cleared. Supportive therapy provided about ongoing stressors.  Ambrose Finland, MD 10/31/2016 11:48 AM

## 2016-10-31 NOTE — Progress Notes (Signed)
Admission Note:  25 yr old female who presents voluntary, in no acute distress, for the treatment of psychosis. Patient presents with slurred, tangential speech.  Disorganized thinking.  Patient was cooperative with admission process. Patient denies SI and contracts for safety upon admission. Patient reports auditory and visual hallucination.  Patient reports "I feel like I have an antenna radio signal sound that goes through my head.  It shows me things through history and inspires me. Sometimes it's annoying and won't let me sleep".  Patient reports "when I was in [place] I had rage and I realized how powerful my tongue is. I have to learn to control my tongue better".  Per report, patient reported VH "seeing gypsies".  Patient currently lives with her family and identifies her "friends" as her support system.  [facility], patient would like to "deal with anxiety," "New coping mechanisms", and "Better people skills".  Skin was assessed and found to be clear of any abnormal marks apart from bruising to forearms bilateral, back of right leg, and left foot".  Patient searched and no contraband found, POC and unit policies explained and understanding verbalized. Consents obtained. Food and fluids offered and accepted. Patient had no additional questions or concerns.

## 2016-10-31 NOTE — ED Notes (Signed)
Pt's heart rate was 127 to 132 on the dinamap. Recheck manually was 130 for about 30 seconds and then slowed down to 62 bpm. Reported to Renata Capriceonrad, [place]. She is anxious and manic as well.

## 2016-10-31 NOTE — ED Notes (Signed)
[facility] that pt's HR was initially high and then slowed down to 62 as she relaxed, (manual).

## 2016-10-31 NOTE — ED Notes (Signed)
Introduced self to patient/family. Pt oriented to unit expectations.  Assessed pt for:  A) Anxiety &/or agitation: Pt is anxious and tearful. She cries over very small concerns with everything being a crisis. Her speech is tangential and disorganized. Denies SI, but has thoughts of hurting others - but just in a playful way. She said, "I might like to slap someone, but just playing."  She went into the bathroom and without towels or other shower supplies she put her head under the shower. Staff took shower supplies to her and she took a shower appropriately. She said that she has the urge to move the furniture around in her room.   S) Safety: Safety maintained with q-15-minute checks and hourly rounds by staff.  A) ADLs: Pt able to perform ADLs independently.  P) Pick-Up (room cleanliness): Pt's room clean and free of clutter.

## 2016-10-31 NOTE — Progress Notes (Signed)
CSW spoke with patient at bedside. CSW obtained patient's signature on voluntary admission and consent for treatment form and release of information form. CSW provided patient's RN with signed documents.

## 2016-10-31 NOTE — ED Notes (Signed)
Pt states, "I think I am starting to see things that might not be there." She pointed to the corner of the room and said, "I see rowing Gypsies in the corner."  She took Seroquel that was ordered and Vasoline was provided for her chapped lips.

## 2016-10-31 NOTE — ED Notes (Addendum)
Pt is speaking in [place].

## 2016-10-31 NOTE — Progress Notes (Signed)
D   Pt is hyperverbal and intrusive   She asked to sign a 72 hour request for discharge saying my problem is my work and masturbating too much  and I figured it out so I can leave now and start working on that   Pt is having flight of ideas    She is pleasant on approach and is cooperative   Her boundaries are a little loose and needs some reminding when socilaizing with her peers A    Verbal support given   Medications administered and effectiveness monitored    Q 15 min checks  Continue to remind of appropriate boundaries   Allowed her to sign the 72 hour request for discharge R   Pt is safe and responsive to verbal support

## 2016-11-01 DIAGNOSIS — F429 Obsessive-compulsive disorder, unspecified: Secondary | ICD-10-CM

## 2016-11-01 MED ORDER — LORAZEPAM 1 MG PO TABS
ORAL_TABLET | ORAL | Status: AC
  Filled 2016-11-01: qty 1

## 2016-11-01 MED ORDER — HYDROXYZINE HCL 25 MG PO TABS
25.0000 mg | ORAL_TABLET | Freq: Once | ORAL | Status: AC
  Administered 2016-11-01: 25 mg via ORAL

## 2016-11-01 MED ORDER — OLANZAPINE 5 MG PO TBDP
5.0000 mg | ORAL_TABLET | Freq: Once | ORAL | Status: AC
  Administered 2016-11-01: 5 mg via ORAL

## 2016-11-01 MED ORDER — HYDROXYZINE HCL 25 MG PO TABS
ORAL_TABLET | ORAL | Status: AC
  Filled 2016-11-01: qty 1

## 2016-11-01 MED ORDER — LORAZEPAM 1 MG PO TABS
1.0000 mg | ORAL_TABLET | Freq: Once | ORAL | Status: AC
  Administered 2016-11-01: 1 mg via ORAL

## 2016-11-01 MED ORDER — OLANZAPINE 5 MG PO TBDP
5.0000 mg | ORAL_TABLET | Freq: Once | ORAL | Status: AC
  Administered 2016-11-01: 5 mg via ORAL
  Filled 2016-11-01 (×2): qty 1

## 2016-11-01 MED ORDER — OLANZAPINE 5 MG PO TBDP
ORAL_TABLET | ORAL | Status: AC
Start: 2016-11-01 — End: 2016-11-01
  Filled 2016-11-01: qty 1

## 2016-11-01 MED ORDER — QUETIAPINE FUMARATE 100 MG PO TABS
100.0000 mg | ORAL_TABLET | Freq: Every day | ORAL | Status: DC
  Administered 2016-11-01: 100 mg via ORAL
  Filled 2016-11-01 (×3): qty 1

## 2016-11-01 NOTE — [facility] Group Notes (Signed)
[facility] Group Notes:  (Clinical Social Work)  11/01/2016  11:00AM-12:00PM  Summary of Progress/Problems:  The main focus of today's process group was to listen to a variety of genres of music and to identify that different types of music provoke different responses.  The patient then was able to identify personally what was soothing for them, as well as energizing, as well as how patient can personally use this knowledge in sleep habits, with depression, and with other symptoms.  The patient was not present at the beginning of group to say how she felt; however, she expressed at the end of group the overall feeling of "more relaxed."  Type of Therapy:  Music Therapy   Participation Level:  Active  Participation Quality:  Attentive and Sharing  Affect:  Blunted  Cognitive:  Oriented  Insight:  Engaged  Engagement in Therapy:  Engaged  Modes of Intervention:   Activity, Exploration  Ambrose MantleMareida Grossman-Orr, LCSW 11/01/2016

## 2016-11-01 NOTE — Progress Notes (Signed)
Patient ID: Tracy Russo, female   DOB: 07/25/1991, 25 y.o.   MRN: [id number]   EKG completed 11/01/16 and placed on chart. Tracy BalintShalita Tycho Cheramie RN

## 2016-11-01 NOTE — [facility] Suicide Risk Assessment (Signed)
[facility]   Nursing information obtained from:  Patient Demographic factors:  Adolescent or young adult, Caucasian, Unemployed Current Mental Status:  NA Loss Factors:  Loss of significant relationship Historical Factors:  Family history of mental illness or substance abuse Risk Reduction Factors:  Living with another person, especially a relative, Positive social support  Total Time spent with patient: 1.5 hours Principal Problem: <principal problem not specified> Diagnosis:   Patient Active Problem List   Diagnosis Date Noted  . Bipolar disorder, current episode mixed (HCC) [F31.60] 10/31/2016  . Bipolar mixed affective disorder, moderate (HCC) [F31.62] 10/31/2016   Subjective Data: Alert, oriented, hyperverbal, obsessed with cleanliness and mind racing  Continued Clinical Symptoms:  Alcohol Use Disorder Identification Test Final Score (AUDIT): 3 The "Alcohol Use Disorders Identification Test", Guidelines for Use in Primary Care, Second Edition.  World Science writerHealth Organization [facility](WHO). Score between 0-7:  no or low risk or alcohol related problems. Score between 8-15:  moderate risk of alcohol related problems. Score between 16-19:  high risk of alcohol related problems. Score 20 or above:  warrants further diagnostic evaluation for alcohol dependence and treatment.   CLINICAL FACTORS:   Bipolar Disorder:   Depressive phase Obsessive-Compulsive Disorder More than one psychiatric diagnosis Unstable or Poor Therapeutic Relationship Previous Psychiatric Diagnoses and Treatments   Musculoskeletal: Strength & Muscle Tone: within normal limits Gait & Station: normal Patient leans: no lean  Psychiatric Specialty Exam: Physical Exam  HENT:  Head: Normocephalic and atraumatic.    Review of Systems  Constitutional: Negative for fever.  Cardiovascular: Negative for chest pain.  Gastrointestinal: Negative for nausea.  Psychiatric/Behavioral: Positive for  depression. Negative for suicidal ideas. The patient is nervous/anxious.     Blood pressure 118/80, pulse (!) 122, temperature 98.3 F (36.8 C), temperature source Oral, resp. rate 18, height 5\' 7"  (1.702 m), weight 62.6 kg (138 lb), last menstrual period 10/14/2016.Body mass index is 21.61 kg/m.  General Appearance: Casual  Eye Contact:  Fair  Speech:  Pressured  Volume:  Increased  Mood:  Anxious  Affect:  Labile  Thought Process:  Linear  Orientation:  Full (Time, Place, and Person)  Thought Content:  Obsessions and Rumination  Suicidal Thoughts:  No  Homicidal Thoughts:  No  Memory:  Immediate;   Fair Recent;   Fair  Judgement:  Fair  Insight:  Shallow  Psychomotor Activity:  Increased  Concentration:  Concentration: Fair and Attention Span: Fair  Recall:  Fis[employer]Fair  Fund of Knowledge:  Fair  Language:  Fair  Akathisia:  Negative  Handed:  Right  AIMS (if indicated):     Assets:  Desire for Improvement  ADL's:  Intact  Cognition:  WNL  Sleep:  Number of Hours: 0.75      COGNITIVE FEATURES THAT CONTRIBUTE TO RISK:  Closed-mindedness    SUICIDE RISK:   Moderate:  Frequent suicidal ideation with limited intensity, and duration, some specificity in terms of plans, no associated intent, good self-control, limited dysphoria/symptomatology, some risk factors present, and identifiable protective factors, including available and accessible social support.   PLAN OF CARE: Admit for stabilization. Medication management and any co morbid condition.   I certify that inpatient services furnished can reasonably be expected to improve the patient's condition.  Thresa RossAKHTAR, Laureen Frederic, MD 11/01/2016, 10:24 AM

## 2016-11-01 NOTE — Plan of Care (Signed)
Problem: Coping: Goal: Ability to cope will improve Outcome: Progressing Pt denies AVH at this time   

## 2016-11-01 NOTE — Progress Notes (Signed)
[facility] Group Notes:  (Nursing/MHT/Case Management/Adjunct)  Date:  11/01/2016  Time:  8:46 PM  Type of Therapy:  Psychoeducational Skills  Participation Level:  Active  Participation Quality:  Appropriate  Affect:  Anxious  Cognitive:  Appropriate  Insight:  Good  Engagement in Group:  Engaged  Modes of Intervention:  Education  Summary of Progress/Problems: The patient mentioned in group that she felt "powerful" today and she felt very "blessed". She did not go into further detail. The patient also verbalized that she needs to stop hurting herself and being so self critical. In terms of the theme for the day, her support system will consist of her "church community".   Hazle CocaGOODMAN, Bonnita Newby S 11/01/2016, 8:46 PM

## 2016-11-01 NOTE — Progress Notes (Signed)
Pt exhibiting bizarre behavior, pt presenting with manic, behaviors. Per NP pt was given 5 mg Zyprexa and 1 mg Ativan

## 2016-11-01 NOTE — H&P (Signed)
Psychiatric Admission Assessment Adult  Patient Identification: Tracy Russo MRN:  [id number]  Date of Evaluation:  11/01/2016  Chief Complaint: Changes in behavior  Principal Diagnosis: Bipolar affective disorder, mixed episodes.  Diagnosis:   Patient Active Problem List   Diagnosis Date Noted  . Bipolar disorder, current episode mixed ([place]) [F31.60] 10/31/2016  . Bipolar mixed affective disorder, moderate ([place]) [F31.62] 10/31/2016   History of Present Illness: This is the first admission assessment in this [facility] for this 25 year old Caucasian female. Admitted to the [facility]  adult unit from the [facility] with complaints of changes in behavior. Her UDS was positive for Benzodiazepine. During this assessment, Tracy Russo reports, "My Mom took me to the [facility] in [place], [place] 2-3 days ago. Prior to this, I was in the psychiatric system in [place] about 2 weeks ago. 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 But, things were not going as I thought. There were a lot to learn & a lot to teach. Then, I had a conflict with my roommate. I had a lot going on in my mind, a lot of energy, destructive & manic. I was talking too much, my mouth trembling. I was very anxious. My roommate thought that I was insane because I would not stop talking. However, it to me, it looks like my body is detoxing from technology. I was on face book & on-line a lot. My memory became very fuzzy that I could not put 2 & 2 together. This is why I left [place]. I was there for 2 months. When the news got to my parents, the next thing I know, they were there to get me. Here I am"  Associated Signs/Symptoms:  Depression Symptoms:  psychomotor agitation, difficulty concentrating, impaired memory, anxiety, disturbed sleep,  (Hypo) Manic Symptoms:  Distractibility, Elevated Mood, Flight of Ideas,  Anxiety Symptoms:  Excessive  Worry,  Psychotic Symptoms:  Denies any hallucinations, delusional thoughts or paranoia.  PTSD Symptoms: Denies any PTSD symptoms or event.  Total Time spent with patient: 1 hour  Past Psychiatric History: Bipolar affective disorder, manic episodes.  Is the patient at risk to self? No.  Has the patient been a risk to self in the past 6 months? No.  Has the patient been a risk to self within the distant past? No.  Is the patient a risk to others? No.  Has the patient been a risk to others in the past 6 months? No.  Has the patient been a risk to others within the distant past? No.   Prior Inpatient Therapy:   Prior Outpatient Therapy:    Alcohol Screening: 1. How often do you have a drink containing alcohol?: 2 to 3 times a week 2. How many drinks containing alcohol do you have on a typical day when you are drinking?: 1 or 2 3. How often do you have six or more drinks on one occasion?: Never Preliminary Score: 0 9. Have you or someone else been injured as a result of your drinking?: No 10. Has a relative or friend or a doctor or another health worker been concerned about your drinking or suggested you cut down?: No Alcohol Use Disorder Identification Test Final Score (AUDIT): 3 Brief Intervention: AUDIT score less than 7 or less-screening does not suggest unhealthy drinking-brief intervention not indicated  Substance Abuse History in the last 12 months:  No.  Consequences of Substance Abuse: NA  Previous Psychotropic Medications: Yes (Patient unable to remember the names of the medications).  Psychological Evaluations: Yes   Past Medical History: History reviewed. No pertinent past medical history. History reviewed. No pertinent surgical history.  Family History: History reviewed. No pertinent family history.  Family Psychiatric  History: Bipolar affective disorder: Father.  Tobacco Screening: Have you used any form of tobacco in the last 30 days? (Cigarettes, Smokeless  Tobacco, Cigars, and/or Pipes): Yes  Social History:  History  Alcohol Use  . Yes    Comment: occasional drinker, less than 2 drinks per week     History  Drug use: Unknown    Additional Social History:  Allergies:  No Known Allergies  Lab Results:  Results for orders placed or performed during the hospital encounter of 10/30/16 (from the past 48 hour(s))  Rapid urine drug screen (hospital performed)     Status: Abnormal   Collection Time: 10/30/16  8:29 PM  Result Value Ref Range   Opiates NONE DETECTED NONE DETECTED   Cocaine NONE DETECTED NONE DETECTED   Benzodiazepines POSITIVE (A) NONE DETECTED   Amphetamines NONE DETECTED NONE DETECTED   Tetrahydrocannabinol NONE DETECTED NONE DETECTED   Barbiturates NONE DETECTED NONE DETECTED    Comment:        DRUG SCREEN FOR MEDICAL PURPOSES ONLY.  IF CONFIRMATION IS NEEDED FOR ANY PURPOSE, NOTIFY LAB WITHIN 5 DAYS.        LOWEST DETECTABLE LIMITS FOR URINE DRUG SCREEN Drug Class       Cutoff (ng/mL) Amphetamine      1000 Barbiturate      200 Benzodiazepine   355 Tricyclics       732 Opiates          300 Cocaine          300 THC              50   Pregnancy, urine     Status: None   Collection Time: 10/30/16  8:30 PM  Result Value Ref Range   Preg Test, Ur NEGATIVE NEGATIVE    Comment:        THE SENSITIVITY OF THIS METHODOLOGY IS >20 mIU/mL.   Comprehensive metabolic panel     Status: Abnormal   Collection Time: 10/30/16  9:44 PM  Result Value Ref Range   Sodium 137 135 - 145 mmol/L   Potassium 3.5 3.5 - 5.1 mmol/L   Chloride 103 101 - 111 mmol/L   CO2 27 22 - 32 mmol/L   Glucose, Bld 110 (H) 65 - 99 mg/dL   BUN 11 6 - 20 mg/dL   Creatinine, Ser 0.60 0.44 - 1.00 mg/dL   Calcium 8.6 (L) 8.9 - 10.3 mg/dL   Total Protein 6.5 6.5 - 8.1 g/dL   Albumin 3.8 3.5 - 5.0 g/dL   AST 23 15 - 41 U/L   ALT 17 14 - 54 U/L   Alkaline Phosphatase 44 38 - 126 U/L   Total Bilirubin 0.4 0.3 - 1.2 mg/dL   GFR calc non Af Amer  >60 >60 mL/min   GFR calc Af Amer >60 >60 mL/min    Comment: (NOTE) The eGFR has been calculated using the CKD EPI equation. This calculation has not been validated in all clinical situations. eGFR's persistently <60 mL/min signify possible Chronic Kidney Disease.    Anion gap 7 5 - 15  Ethanol     Status: None   Collection  Time: 10/30/16  9:44 PM  Result Value Ref Range   Alcohol, Ethyl (B) <5 <5 mg/dL    Comment:        LOWEST DETECTABLE LIMIT FOR SERUM ALCOHOL IS 5 mg/dL FOR MEDICAL PURPOSES ONLY   cbc     Status: None   Collection Time: 10/30/16  9:44 PM  Result Value Ref Range   WBC 5.2 4.0 - 10.5 K/uL   RBC 4.24 3.87 - 5.11 MIL/uL   Hemoglobin 12.9 12.0 - 15.0 g/dL   HCT 37.1 36.0 - 46.0 %   MCV 87.5 78.0 - 100.0 fL   MCH 30.4 26.0 - 34.0 pg   MCHC 34.8 30.0 - 36.0 g/dL   RDW 12.9 11.5 - 15.5 %   Platelets 179 150 - 400 K/uL  Acetaminophen level     Status: Abnormal   Collection Time: 10/30/16  9:45 PM  Result Value Ref Range   Acetaminophen (Tylenol), Serum <10 (L) 10 - 30 ug/mL    Comment:        THERAPEUTIC CONCENTRATIONS VARY SIGNIFICANTLY. A RANGE OF 10-30 ug/mL MAY BE AN EFFECTIVE CONCENTRATION FOR MANY PATIENTS. HOWEVER, SOME ARE BEST TREATED AT CONCENTRATIONS OUTSIDE THIS RANGE. ACETAMINOPHEN CONCENTRATIONS >150 ug/mL AT 4 HOURS AFTER INGESTION AND >50 ug/mL AT 12 HOURS AFTER INGESTION ARE OFTEN ASSOCIATED WITH TOXIC REACTIONS.   Salicylate level     Status: None   Collection Time: 10/30/16  9:45 PM  Result Value Ref Range   Salicylate Lvl <4.2 2.8 - 30.0 mg/dL  TSH     Status: None   Collection Time: 10/30/16  9:45 PM  Result Value Ref Range   TSH 1.184 0.350 - 4.500 uIU/mL    Comment: Performed by a 3rd Generation assay with a functional sensitivity of <=0.01 uIU/mL.    Blood Alcohol level:  Lab Results  Component Value Date   ETH <5 68[birth year]1962   Metabolic Disorder Labs:  No results found for: HGBA1C, MPG No results found for:  PROLACTIN No results found for: CHOL, TRIG, HDL, CHOLHDL, VLDL, LDLCALC  Current Medications: Current Facility-Administered Medications  Medication Dose Route Frequency Provider Last Rate Last Dose  . alum & mag hydroxide-simeth (MAALOX/MYLANTA) 200-200-20 MG/5ML suspension 30 mL  30 mL Oral Q4H PRN Benjamine Mola, FNP      . ibuprofen (ADVIL,MOTRIN) tablet 600 mg  600 mg Oral Q8H PRN Benjamine Mola, FNP      . Influenza vac split quadrivalent PF (FLUARIX) injection 0.5 mL  0.5 mL Intramuscular Tomorrow-1000 Derrill Center, NP      . magnesium hydroxide (MILK OF MAGNESIA) suspension 30 mL  30 mL Oral Daily PRN Benjamine Mola, FNP   30 mL at 11/01/16 0821  . nicotine (NICODERM CQ - dosed in mg/24 hours) patch 21 mg  21 mg Transdermal Daily Benjamine Mola, FNP   21 mg at 11/01/16 0756  . ondansetron (ZOFRAN) tablet 4 mg  4 mg Oral Q8H PRN Benjamine Mola, FNP      . QUEtiapine (SEROQUEL) tablet 100 mg  100 mg Oral QHS Merian Capron, MD      . QUEtiapine (SEROQUEL) tablet 25 mg  25 mg Oral TID Benjamine Mola, FNP   25 mg at 11/01/16 0756  . white petrolatum (VASELINE) gel   Topical PRN Benjamine Mola, FNP       PTA Medications: Prescriptions Prior to Admission  Medication Sig Dispense Refill Last Dose  . ARIPiprazole (ABILIFY) 15 MG tablet  Take 30 mg by mouth daily.   Past Week at Unknown time  . divalproex (DEPAKOTE) 500 MG DR tablet Take 500 mg by mouth 2 (two) times daily.   Past Week at Unknown time  . LORazepam (ATIVAN) 0.5 MG tablet Take 0.5 mg by mouth 3 (three) times daily with meals.   Past Week at Unknown time  . OLANZapine (ZYPREXA) 10 MG tablet Take 20 mg by mouth at bedtime.   Past Week at Unknown time  . OLANZapine (ZYPREXA) 5 MG tablet Take 5 mg by mouth 2 (two) times daily. 5 mg with breakfast and 5 mg with lunch   Past Week at Unknown time  . PRESCRIPTION MEDICATION Take 2-4 tablets by mouth. Entumine/Clotapine - antipsychotic not available in [place]. Patient take 2-4 tablets of an  unknown strength qHS for sleep.   Past Week at Unknown time   Musculoskeletal: Strength & Muscle Tone: within normal limits Gait & Station: normal Patient leans: N/A  Psychiatric Specialty Exam: Physical Exam  Constitutional: She is oriented to person, place, and time. She appears well-developed and well-nourished.  HENT:  Head: Normocephalic.  Eyes: Pupils are equal, round, and reactive to light.  Neck: Normal range of motion.  Cardiovascular: Normal rate.   Respiratory: Effort normal.  GI: Soft.  Genitourinary:  Genitourinary Comments: Patient denies any issues in this area.  Musculoskeletal: Normal range of motion.  Neurological: She is alert and oriented to person, place, and time.  Skin: Skin is warm.    Review of Systems  Constitutional: Negative.   HENT:       Patient reports has difficulties hearing clearly. Does not use hearing aids.  Eyes: Negative.   Respiratory: Negative.   Cardiovascular: Negative.   Gastrointestinal: Negative.   Genitourinary: Negative.   Musculoskeletal: Negative.   Skin: Negative.   Neurological: Negative.   Endo/Heme/Allergies: Negative.   Psychiatric/Behavioral: Positive for memory loss (Patient sates her memory is fuzzy.). Negative for hallucinations, substance abuse and suicidal ideas. Depression: Denies feeling depressed. The patient has insomnia. The patient is not nervous/anxious.     Blood pressure 118/80, pulse (!) 122, temperature 98.3 F (36.8 C), temperature source Oral, resp. rate 18, height '5\' 7"'  (1.702 m), weight 62.6 kg (138 lb), last menstrual period 10/14/2016.Body mass index is 21.61 kg/m.  General Appearance: Casual, manic with presssured speech.  Eye Contact:  Fair  Speech:  Pressured  Volume:  Increased  Mood:  Manic  Affect:  Congruent  Thought Process:  Disorganized and Descriptions of Associations: Circumstantial  Orientation:  Full (Time, Place, and Person)  Thought Content:  Ideas of Reference:   Delusions,  Rumination and Tangential  Suicidal Thoughts:  Denies any thoughts, plans or intent.  Homicidal Thoughts:  Denies any thoughts, plans or intent.  Memory:  Immediate;   Fair Recent;   Fair Remote;   Poor  Judgement:  Impaired  Insight:  Fair  Psychomotor Activity:  Increased and Restlessness  Concentration:  Concentration: Poor and Attention Span: Poor  Recall:  [employer] of Knowledge:  Fair  Language:  Good  Akathisia:  Negative  Handed:  Right  AIMS (if indicated):     Assets:  Desire for Improvement Social Support  ADL's:  Intact  Cognition:  Impaired,  Mild  Sleep:  Number of Hours: 0.75   Treatment Plan/Recommendations: 1. Admit for crisis management and stabilization, estimated length of stay 3-5 days.  2. Medication management to reduce current symptoms to base line and improve the patient's  overall level of functioning: See MAR.  3. Treat health problems as indicated.  4. Develop treatment plan to decrease risk of relapse upon discharge and the need for readmission.  5. Psycho-social education regarding relapse prevention and self care.  6. Health care follow up as needed for medical problems.  7. Review, reconcile, and reinstate any pertinent home medications for other health issues where appropriate. 8. Call for consults with hospitalist for any additional specialty patient care services as needed.  Observation Level/Precautions:  15 minute checks  Laboratory:  Per ED, UDS positive for Benzodiazepine  Psychotherapy: Group sessions   Medications: See MAR  Consultations: As needed  Discharge Concerns: Safety, mood stability   Estimated LOS: 5-7 days  Other: Admit to the [facility]    Physician Treatment Plan for Primary Diagnosis: Patient will need medication management to re-stabilize her manic mood.  Long Term Goal(s): Improvement in symptoms so as ready for discharge  Short Term Goals: Ability to identify changes in lifestyle to reduce recurrence of condition will  improve, Ability to verbalize feelings will improve, Ability to demonstrate self-control will improve, Ability to identify and develop effective coping behaviors will improve and Compliance with prescribed medications will improve  Physician Treatment Plan for Secondary Diagnosis: Active Problems:   Bipolar mixed affective disorder, moderate (HCC)  Long Term Goal(s): Improvement in symptoms so as ready for discharge  Short Term Goals: Ability to verbalize feelings will improve, Ability to demonstrate self-control will improve, Ability to identify and develop effective coping behaviors will improve and Compliance with prescribed medications will improve  I certify that inpatient services furnished can reasonably be expected to improve the patient's condition.    Lindell Spar I, NP, PMHNP, FNP-BC 12/3/201710:08 AM  I have examined the patient and agreed with the findings of H&P and treatment plan. I have also done suicide assessment on this patient.

## 2016-11-01 NOTE — Progress Notes (Signed)
D: Pt denies SI/HI/AV. Pt is pleasant and cooperative. Pt presents with flight of ideas, tangential speech, and flight of ideas. Pt hypomanic and needs redirection to stay focused. Pt is intrusive and has to be reminded to keep her space and give people some personal space and that every conversation does not involve her.   A: Pt was offered support and encouragement. Pt was given scheduled medications. Pt was encourage to attend groups. Q 15 minute checks were done for safety.   R:Pt attends groups and interacts well with peers and staff. Pt is taking medication. Pt has no complaints.Pt receptive to treatment and safety maintained on unit.

## 2016-11-01 NOTE — Progress Notes (Signed)
Patient ID: Tracy Russo, female   DOB: 04/06/1991, 25 y.o.   MRN: [id number]     D: Pt is hyperverbal and intrusive, and has required frequent redirection. Pt has had poor boundaries and requires redirection with that as well. Pt was seen by doctor as she remained very manic on the unit. New orders were noted to give patient Vistaril, Zyprexa, and Ativan, for which helped patient on the unit. Pt reported that her depression was a 3, her hopelessness was a 2, and her anxiety was a 8. Pts goal for today was to focus on task at hand. Pt reported being negative SI/HI, no AH/VH noted. A: 15 min checks continued for patient safety. R: Pt safety maintained.

## 2016-11-02 DIAGNOSIS — F429 Obsessive-compulsive disorder, unspecified: Secondary | ICD-10-CM

## 2016-11-02 DIAGNOSIS — F3162 Bipolar disorder, current episode mixed, moderate: Secondary | ICD-10-CM

## 2016-11-02 DIAGNOSIS — Z79899 Other long term (current) drug therapy: Secondary | ICD-10-CM

## 2016-11-02 LAB — LIPID PANEL
Cholesterol: 108 mg/dL (ref 0–200)
HDL: 35 mg/dL — ABNORMAL LOW (ref >40)
LDL Cholesterol: 63 mg/dL (ref 0–99)
Total CHOL/HDL Ratio: 3.1 RATIO
Triglycerides: 51 mg/dL (ref <150)
VLDL: 10 mg/dL (ref 0–40)

## 2016-11-02 MED ORDER — LORAZEPAM 2 MG/ML IJ SOLN: 2.0000 mg | Freq: Once | INTRAMUSCULAR | Status: DC

## 2016-11-02 MED ORDER — OLANZAPINE 10 MG PO TBDP
10.0000 mg | ORAL_TABLET | Freq: Once | ORAL | Status: AC
  Administered 2016-11-02: 10 mg via ORAL
  Filled 2016-11-02 (×2): qty 1

## 2016-11-02 MED ORDER — LORAZEPAM 1 MG PO TABS: 1.0000 mg | ORAL_TABLET | ORAL | Status: DC | PRN

## 2016-11-02 MED ORDER — QUETIAPINE FUMARATE 50 MG PO TABS
150.0000 mg | ORAL_TABLET | Freq: Every day | ORAL | Status: DC
  Administered 2016-11-03: 150 mg via ORAL
  Filled 2016-11-02 (×3): qty 1

## 2016-11-02 MED ORDER — OLANZAPINE 10 MG PO TABS
10.0000 mg | ORAL_TABLET | Freq: Three times a day (TID) | ORAL | Status: DC | PRN
  Administered 2016-11-03: 10 mg via ORAL
  Filled 2016-11-02: qty 1

## 2016-11-02 MED ORDER — LORAZEPAM 2 MG/ML IJ SOLN
1.0000 mg | Freq: Four times a day (QID) | INTRAMUSCULAR | Status: DC | PRN
  Administered 2016-11-02: 1 mg via INTRAMUSCULAR
  Filled 2016-11-02: qty 1

## 2016-11-02 MED ORDER — QUETIAPINE FUMARATE 50 MG PO TABS
50.0000 mg | ORAL_TABLET | Freq: Three times a day (TID) | ORAL | Status: DC
  Administered 2016-11-02 – 2016-11-05 (×8): 50 mg via ORAL
  Filled 2016-11-02 (×11): qty 1

## 2016-11-02 MED ORDER — ZIPRASIDONE MESYLATE 20 MG IM SOLR: 20.0000 mg | INTRAMUSCULAR | Status: DC | PRN

## 2016-11-02 MED ORDER — LORAZEPAM 1 MG PO TABS
1.0000 mg | ORAL_TABLET | Freq: Four times a day (QID) | ORAL | Status: DC | PRN
  Administered 2016-11-03 – 2016-11-12 (×12): 1 mg via ORAL
  Filled 2016-11-02 (×12): qty 1

## 2016-11-02 MED ORDER — OLANZAPINE 10 MG PO TBDP: 10.0000 mg | ORAL_TABLET | Freq: Four times a day (QID) | ORAL | Status: DC | PRN

## 2016-11-02 NOTE — Plan of Care (Signed)
Problem: Safety: Goal: Ability to remain free from injury will improve Outcome: Progressing Pt safe on the unit this evening.

## 2016-11-02 NOTE — Progress Notes (Signed)
[facility] MD Progress Note  11/02/2016 2:20 PM Tracy Russo  MRN:  [id number] Subjective:  Per nursing, patient is manic, kicking doors, scratching the floor.    Objective:  She was up at the nurses station.  Spoke to patient and she had pressured speech asking for a nail clipper to trim her dirty nails.  Patient is howling, barking, hypersexual, inappropriate, masturbating, taking clothes off, and was found to stuff the commode with styrofoam cups and socks.     Principal Problem: Bipolar mixed affective disorder, moderate (HCC) Diagnosis:   Patient Active Problem List   Diagnosis Date Noted  . Bipolar mixed affective disorder, moderate (HCC) [F31.62] 10/31/2016    Priority: High  . Obsessive-compulsive disorder [F42.9]   . Bipolar disorder, current episode mixed (HCC) [F31.60] 10/31/2016   Total Time spent with patient: 30 minutes  Past Psychiatric History: see HPI  Past Medical History: History reviewed. No pertinent past medical history. History reviewed. No pertinent surgical history. Family History: History reviewed. No pertinent family history. Family Psychiatric  History: see HPI Social History:  History  Alcohol Use  . Yes    Comment: occasional drinker, less than 2 drinks per week     History  Drug use: Unknown    Social History   Social History  . Marital status: Single    Spouse name: N/A  . Number of children: N/A  . Years of education: N/A   Social History Main Topics  . Smoking status: Never Smoker  . Smokeless tobacco: Never Used  . Alcohol use Yes     Comment: occasional drinker, less than 2 drinks per week  . Drug use: Unknown  . Sexual activity: Not Asked   Other Topics Concern  . None   Social History Narrative  . None   Additional Social History:                         Sleep: Good  Appetite:  Good  Current Medications: Current Facility-Administered Medications  Medication Dose Route Frequency Provider Last Rate Last Dose   . alum & mag hydroxide-simeth (MAALOX/MYLANTA) 200-200-20 MG/5ML suspension 30 mL  30 mL Oral Q4H PRN Beau Fanny, FNP      . ibuprofen (ADVIL,MOTRIN) tablet 600 mg  600 mg Oral Q8H PRN Beau Fanny, FNP      . Influenza vac split quadrivalent PF (FLUARIX) injection 0.5 mL  0.5 mL Intramuscular Tomorrow-1000 Oneta Rack, NP      . LORazepam (ATIVAN) tablet 1 mg  1 mg Oral Q6H PRN Adonis Brook, NP       Or  . LORazepam (ATIVAN) injection 1 mg  1 mg Intramuscular Q6H PRN Adonis Brook, NP      . magnesium hydroxide (MILK OF MAGNESIA) suspension 30 mL  30 mL Oral Daily PRN Beau Fanny, FNP   30 mL at 11/01/16 0821  . nicotine (NICODERM CQ - dosed in mg/24 hours) patch 21 mg  21 mg Transdermal Daily Beau Fanny, FNP   21 mg at 11/02/16 0805  . OLANZapine (ZYPREXA) tablet 10 mg  10 mg Oral Q8H PRN Adonis Brook, NP      . ondansetron [facility]) tablet 4 mg  4 mg Oral Q8H PRN Beau Fanny, FNP      . QUEtiapine (SEROQUEL) tablet 100 mg  100 mg Oral QHS Thresa Ross, MD   100 mg at 11/01/16 2129  . QUEtiapine (SEROQUEL) tablet 25 mg  25 mg Oral TID Beau FannyJohn C Withrow, FNP   25 mg at 11/02/16 1202  . white petrolatum (VASELINE) gel   Topical PRN Beau FannyJohn C Withrow, FNP        Lab Results:  Results for orders placed or performed during the hospital encounter of 10/31/16 (from the past 48 hour(s))  Lipid panel     Status: Abnormal   Collection Time: 11/02/16  6:15 AM  Result Value Ref Range   Cholesterol 108 0 - 200 mg/dL   Triglycerides 51 <621<150 mg/dL   HDL 35 (L) >30>40 mg/dL   Total CHOL/HDL Ratio 3.1 RATIO   VLDL 10 0 - 40 mg/dL   LDL Cholesterol 63 0 - 99 mg/dL    Comment:        Total Cholesterol/HDL:CHD Risk Coronary Heart Disease Risk Table                     Men   Women  1/2 Average Risk   3.4   3.3  Average Risk       5.0   4.4  2 X Average Risk   9.6   7.1  3 X Average Risk  23.4   11.0        Use the calculated Patient Ratio above and the CHD Risk Table to determine  the patient's CHD Risk.        ATP III CLASSIFICATION (LDL):  <100     mg/dL   Optimal  [id number]  mg/dL   Near or Above                    Optimal  130-159  mg/dL   Borderline  [id number]  mg/dL   High  >284>190     mg/dL   Very High [facility]     Blood Alcohol level:  Lab Results  Component Value Date   [facility]    Metabolic Disorder Labs: No results found for: HGBA1C, MPG No results found for: PROLACTIN Lab Results  Component Value Date   CHOL 108 11/02/2016   TRIG 51 11/02/2016   HDL 35 (L) 11/02/2016   CHOLHDL 3.1 11/02/2016   VLDL 10 11/02/2016   LDLCALC 63 11/02/2016    Physical Findings: AIMS: Facial and Oral Movements Muscles of Facial Expression: None, normal Lips and Perioral Area: None, normal Jaw: None, normal Tongue: None, normal,Extremity Movements Upper (arms, wrists, hands, fingers): None, normal Lower (legs, knees, ankles, toes): None, normal, Trunk Movements Neck, shoulders, hips: None, normal, Overall Severity Severity of abnormal movements (highest score from questions above): None, normal Incapacitation due to abnormal movements: None, normal Patient's awareness of abnormal movements (rate only patient's report): No Awareness, Dental Status Current problems with teeth and/or dentures?: No Does patient usually wear dentures?: No  CIWA:    COWS:     Musculoskeletal: Strength & Muscle Tone: within normal limits Gait & Station: normal Patient leans: N/A  Psychiatric Specialty Exam: Physical Exam  Vitals reviewed. Psychiatric: Her mood appears anxious. Her affect is blunt, labile and inappropriate. She is agitated and hyperactive. Thought content is delusional. Cognition and memory are impaired. She expresses impulsivity and inappropriate judgment.    Review of Systems  Constitutional: Negative.   HENT: Negative.   Eyes: Negative.   Respiratory: Negative.   Cardiovascular: Negative.   Gastrointestinal: Negative.    Genitourinary: Negative.   Musculoskeletal: Negative.   Skin: Negative.   Neurological: Negative.   Endo/Heme/Allergies: Negative.  Blood pressure 131/78, pulse (!) 103, temperature 98 F (36.7 C), temperature source Oral, resp. rate 16, height 5\' 7"  (1.702 m), weight 62.6 kg (138 lb), last menstrual period 10/14/2016.Body mass index is 21.61 kg/m.  General Appearance: Disheveled  Eye Contact:  Fair  Speech:  Clear and Coherent  Volume:  Normal  Mood:  NA  Affect:  Appropriate  Thought Process:  Disorganized and Irrelevant  Orientation:  Full (Time, Place, and Person)  Thought Content:  Illogical, Delusions, Obsessions, Rumination and Tangential  Suicidal Thoughts:  No  Homicidal Thoughts:  No  Memory:  Immediate;   Poor Recent;   Poor Remote;   Poor  Judgement:  Poor  Insight:  Lacking  Psychomotor Activity:  Normal  Concentration:  Concentration: Poor and Attention Span: Poor  Recall:  Poor  Fund of Knowledge:  Poor  Language:  Poor  Akathisia:  Negative  Handed:  Right  AIMS (if indicated):     Assets:  Resilience  ADL's:  Intact  Cognition:  WNL  Sleep:  Number of Hours: 2.5     Treatment Plan Summary: Review of chart, vital signs, medications, and notes.  Reviewed EKG with Dr Jama Flavorsobos. Will need to assess for need for IVC in the morning.  Patient signed 72 hr release on 12/2 at 2000. 1-Individual and group therapy  2-Medication management for depression and anxiety: Medications reviewed with the patient. Olanzapine 10 mg Q8Hrs PRN agitation, Ativan 1 mg po/IM for anxiety and agitation Q6hrs PRN.  Seroquel increased to 150 mg QHS and increased to 50 mg TID daily for mood stabilization and psychosis.   3-Coping skills for depression, anxiety  4-Continue crisis stabilization and management  5-Address health issues--monitoring vital signs, stable  6-Treatment plan in progress to prevent relapse of depression and anxiety   Lindwood QuaSheila May Agustin, NP [facility] 11/02/2016,  2:20 PM   Agree with NP Progress Note, as above

## 2016-11-02 NOTE — CIRT (Signed)
OVERHEAD CIRT paged for another patient earlier this day (approximately 10 minutes prior to this event. ) During this time another peer was loud, screaming at staff, pacing and posturing.This appeared to escalate this patients behaviors as the patient then came out of her room and began screaming at staff, with pressured , tangential speech that was difficult to understand at times,  stating '' don't talk to us! Get it under control! We don't want to hear you'' She then began speaking in spanish, and disrobing down the hallway. She removed her socks and then was attempting to unbutton her bathroom, exposing her buttocks. She was redirected by staff but continued to pace and her behaviors continued to escalate. She was redirected to her room with staff, and while agitation protocol medications were being drawn up , the patient continued to speak in nonsensical language. Tip toeing in the room then jumping onto the bed. She was offered medications and then refused, but then started screaming, jumping onto the bed and then doing a ''downward facing dog '' yoga pose on the bed in her underware. Patient required a manual hold to receive medications to maintain her safety and help with above described behaviors. She was held from 1454 to 1455 and then immediately released. She denied any injury, and continued to pace the room, screaming and crying intermittently. Hillery Jacksanika Lewis NP notified of above event. Patient remained 1.1. Post event per protocol. V/S obtained. Patient offered fluids and foods. Roni RN CN present for event as well as this Clinical research associatewriter, Haematologistrimary RN Olivette , and AD Daune PerchMack Whitsett RN. Patient remains safe at this time, will con't to monitor q 15 minutes as ordered .

## 2016-11-02 NOTE — Progress Notes (Addendum)
D: Pt denies SI/HI/AVH. Pt is pleasant and cooperative. Pt presented very lethargic this evening , pt was seen in the dayroom for group, but pt went to room afterwards and went to sleep.  Pt has presented appropriate on the unit this evening, pt had to have minimal redirection this evening.   A: Pt was offered support and encouragement. Pt was given scheduled medications. Pt was encourage to attend groups. Q 15 minute checks were done for safety.   R:Pt attends groups and interacts well with peers and staff. Pt is taking medication. Pt has no complaints at this time.Pt receptive to treatment and safety maintained on unit.

## 2016-11-02 NOTE — Progress Notes (Signed)
Recreation Therapy Notes  Date: 11/02/16 Time: 1000 Location: 500 Hall Dayroom  Group Topic: Coping Skills  Goal Area(s) Addresses:  Patients will be able to identify positive coping skills. Patients will be able to identify the benefits of coping skills. Patients will be able to identify how using positive coping skill can help them post d/c.  Behavioral Response: Anxious  Intervention: Financial traderWeb worksheet, pencils  Activity: OrthoptistWeb Design.  Patients were to identify the situations that have brought them to the hospital and place them inside the spider web.  Patients were to then identify coping skills that can help them deal with those situations.  Education: PharmacologistCoping Skills, Building control surveyorDischarge Planning.   Education Outcome: Acknowledges understanding/In group clarification offered/Needs additional education.   Clinical Observations/Feedback:  Pt stated "I need coping skills but don't know what coping skills I need".  Pt left early with nurse and did not return.   Caroll RancherMarjette  Antolin, LRT/CTRS       Caroll RancherLindsay, Nhat Hearne A 11/02/2016 12:08 PM

## 2016-11-02 NOTE — Progress Notes (Signed)
D: Pt presents irritable and anxious on initial approach. Noted to be very confused, disorganized, delusional and tangential with flight of ideas. Observed crawling on floor, yelling like an owl, crying, took her clothes off, spreading water from her cup on the floor in the hall to her room. Pt also put a pair of socks, underwear and cup in the commode, going into peer's rooms. Verbal redirections was ineffective at the time as pt's behavior continued to escalate.  AAlcario Drought: [name], NP made aware. New order received Zyprexa Zydis 10 mg PO X1 which was given at 1015. Emotional support and availability provided to pt. Encouraged to voice concerns and approach staff for assistance on unit. Q 15 minutes checks. Q 15 minutes checks remains effective as ordered.  R: Pt was reassessed at 1115 post Zydis and was found sitting on top of heater in her room crying without clothes on. Continues to require frequent verbal redirections for impulsive and intrusive behavior. Safety maintained on unit.

## 2016-11-02 NOTE — Progress Notes (Signed)
D: Pt observed crying in her room because her family left early. Pt is verbally redirectable at this time. A: Emotional support and availability provided to pt.  Encouraged to call family back for visit. Q 15 minutes checks maintained for safety. R: Pt compliant with evening medications. Cooperative with care at this time. Remains safe on unit.

## 2016-11-02 NOTE — Progress Notes (Signed)
Adult Psychoeducational Group Note  Date:  11/02/2016 Time:  9:29 PM  Group Topic/Focus:  Wrap-Up Group:   The focus of this group is to help patients review their daily goal of treatment and discuss progress on daily workbooks.   Participation Level:  Minimal  Participation Quality:  Drowsy  Affect:  Flat  Cognitive:  Lacking  Insight: Lacking  Engagement in Group:  Engaged  Modes of Intervention:  Socialization and Support  Additional Comments:  Patient attended and participated in group tonight. She reports that today was OK. She went to breakfast then the day got weird. She don't remember what happened. He family visited with her which was a good visit. Tracy Russo, Tracy Russo [facility] 11/02/2016, 9:29 PM

## 2016-11-02 NOTE — Progress Notes (Signed)
Recreation Therapy Notes  INPATIENT RECREATION THERAPY ASSESSMENT  Patient Details Name: Tracy Russo MRN: [id number] DOB: 01/16/1991 Today's Date: 11/02/2016  Patient Stressors: Other (Comment) (Social media, religious authority, and myself)  Pt stated she was here because something happened to her in [place] and she can't remember it.  Coping Skills:   Isolate, Arguments, Substance Abuse, Avoidance, Self-Injury, Exercise, Art/Dance, Talking, Music  Personal Challenges: Anger, Communication, Concentration, Decision-Making, Expressing Yourself, Self-Esteem/Confidence, Relationships, Problem-Solving, Social Interaction, Stress Management, Substance Abuse, Time Management, Work International aid/development workererformance, Licensed conveyancerTrusting Others  Leisure Interests (2+):  Individual - AnimatorComputer, Social - Administrator, sportsocial Media, Individual - Reading  Awareness of Community Resources:  Yes  Community Resources:  Library, Coffee Shop, Other (Comment) (Pool, [place])  Current Use: Yes  Patient Strengths:  Friendly; trainable not teachable  Patient Identified Areas of Improvement:  Self-discipline; self-expression  Current Recreation Participation:  Once every 3-5 days  Patient Goal for Hospitalization:  "Be a safe place for myself and communityEast Dailey'  [place]  [place]   Current [place] (including self-harm):  No  Current HI:  No  Consent to Intern Participation: N/A   Caroll RancherMarjette Tijuana Scheidegger, LRT/CTRS   Lillia AbedLindsay, Caylan Chenard A 11/02/2016, 1:15 PM

## 2016-11-02 NOTE — [facility] Group Notes (Signed)
[facility] LCSW Group Therapy  11/02/2016 1:15 pm  Type of Therapy: Process Group Therapy  Participation Level:  Active  Participation Quality:  Appropriate  Affect:  Flat  Cognitive:  Oriented  Insight:  Improving  Engagement in Group:  Limited  Engagement in Therapy:  Limited  Modes of Intervention:  Activity, Clarification, Education, Problem-solving and Support  Summary of Progress/Problems: Today's group addressed the issue of overcoming obstacles.  Patients were asked to identify their biggest obstacle post d/c that stands in the way of their on-going success, and then problem solve as to how to manage this. Came initially.  Mood was calm.  Expressed some irrelevant thoughts.  Left after about 10 minutes and did not return.  Tracy Russo, Tracy Russo 11/02/2016   3:40 PM

## 2016-11-03 ENCOUNTER — Encounter (HOSPITAL_COMMUNITY): Payer: Self-pay | Admitting: Psychiatry

## 2016-11-03 DIAGNOSIS — F312 Bipolar disorder, current episode manic severe with psychotic features: Principal | ICD-10-CM

## 2016-11-03 DIAGNOSIS — Z8249 Family history of ischemic heart disease and other diseases of the circulatory system: Secondary | ICD-10-CM

## 2016-11-03 DIAGNOSIS — F319 Bipolar disorder, unspecified: Secondary | ICD-10-CM | POA: Clinically undetermined

## 2016-11-03 DIAGNOSIS — Z818 Family history of other mental and behavioral disorders: Secondary | ICD-10-CM

## 2016-11-03 LAB — PROLACTIN: Prolactin: 26.5 ng/mL — ABNORMAL HIGH (ref 4.8–23.3)

## 2016-11-03 LAB — HEMOGLOBIN A1C
Hgb A1c MFr Bld: 4.8 % (ref 4.8–5.6)
Mean Plasma Glucose: 91 mg/dL

## 2016-11-03 MED ORDER — OLANZAPINE 10 MG IM SOLR: 10.0000 mg | Freq: Three times a day (TID) | INTRAMUSCULAR | Status: DC | PRN

## 2016-11-03 MED ORDER — QUETIAPINE FUMARATE 300 MG PO TABS
300.0000 mg | ORAL_TABLET | Freq: Every day | ORAL | Status: DC
  Administered 2016-11-03: 300 mg via ORAL
  Filled 2016-11-03 (×3): qty 1

## 2016-11-03 MED ORDER — OLANZAPINE 10 MG PO TABS
10.0000 mg | ORAL_TABLET | Freq: Three times a day (TID) | ORAL | Status: DC | PRN
  Administered 2016-11-03 – 2016-11-12 (×10): 10 mg via ORAL
  Filled 2016-11-03 (×10): qty 1

## 2016-11-03 MED ORDER — PROPRANOLOL HCL 10 MG PO TABS
10.0000 mg | ORAL_TABLET | Freq: Two times a day (BID) | ORAL | Status: DC
  Administered 2016-11-03 – 2016-11-04 (×2): 10 mg via ORAL
  Filled 2016-11-03 (×6): qty 1

## 2016-11-03 MED ORDER — LITHIUM CARBONATE 300 MG PO CAPS
300.0000 mg | ORAL_CAPSULE | Freq: Two times a day (BID) | ORAL | Status: DC
  Administered 2016-11-03 – 2016-11-09 (×13): 300 mg via ORAL
  Filled 2016-11-03 (×16): qty 1

## 2016-11-03 NOTE — Progress Notes (Signed)
Recreation Therapy Notes  Date: 11/03/16 Time: 1000 Location: 500 Hall Dayroom  Group Topic: Anger Management  Goal Area(s) Addresses:  Patient will identify triggers for anger.  Patient will identify physical reaction to anger.   Patient will identify benefit of using coping skills when angry.  Intervention: Anger thermometer worksheet  Activity: Anger Thermometer.  Patients were given a worksheet with a thermometer to rank their experiences with anger "1" being the least and "10" being the most angry.  Patients were to give a description of the incident, how they reacted, how they felt and the consequences of their actions.    Education: Anger Management, Discharge Planning   Education Outcome: Acknowledges education/In group clarification offered/Needs additional education.   Clinical Observations/Feedback: Pt did not attend group.   Caroll RancherMarjette Torrey Ballinas, LRT/CTRS       Caroll RancherLindsay, Malea Swilling A 11/03/2016 12:16 PM

## 2016-11-03 NOTE — [facility] Group Notes (Signed)
[facility] LCSW Group Therapy  11/03/2016 , 3:11 PM   Type of Therapy:  Group Therapy  Participation Level:  Active  Participation Quality:  Attentive  Affect:  Appropriate  Cognitive:  Alert  Insight:  Improving  Engagement in Therapy:  Engaged  Modes of Intervention:  Discussion, Exploration and Socialization  Summary of Progress/Problems: Today's group focused on the term Diagnosis.  Participants were asked to define the term, and then pronounce whether it is a negative, positive or neutral term.  Initially declined to attend.  "I have too much to think about right now."  Joined us towards the end, but jumped right in, appropriately.  Was on topic and able to directly get to point.  Talked about how she has experienced staff here as supportive and caring.  [name], Yony Roulston B 11/03/2016 , 3:11 PM

## 2016-11-03 NOTE — [facility] Counselor (Signed)
Adult Comprehensive Assessment  Patient ID: Tracy Russo, female   DOB: 11/20/1991, 25 y.o.   MRN: [id number]  Information Source: Information source: Patient  Current Stressors:  Employment / Job issues: Veterinary surgeonUnemployed Financial / Lack of resources (include bankruptcy): Dependent on family currently Housing / Lack of housing: lives with parents  Living/Environment/Situation:  Living Arrangements: Parent (mom and dad) Living conditions (as described by patient or guardian): good How long has patient lived in current situation?: "All my life, except when I was at school." What is atmosphere in current home: Comfortable, Supportive  Family History:  Are you sexually active?: No What is your sexual orientation?: straight Does patient have children?: No  Childhood History:  By whom was/is the patient raised?: Both parents Additional childhood history information: "I would not describe my parents as affectionate with me." Description of patient's relationship with caregiver when they were a child: good Patient's description of current relationship with people who raised him/her: good Does patient have siblings?: Yes Number of Siblings: 1 Description of patient's current relationship with siblings: brother, "it's OK" Did patient suffer any verbal/emotional/physical/sexual abuse as a child?: No Did patient suffer from severe childhood neglect?: No Has patient ever been sexually abused/assaulted/raped as an adolescent or adult?: No Was the patient ever a victim of a crime or a disaster?: No Witnessed domestic violence?: No Has patient been effected by domestic violence as an adult?: No  Education:  Highest grade of school patient has completed: Advice workerGraduate of [employer] in [place] and International Studies, with a minor in Poly Sci Currently a student?: No Learning disability?: No  Employment/Work Situation:   Employment situation: Unemployed Patient's job has been impacted by current  illness: Yes Describe how patient's job has been impacted: unable to work due to symptoms What is the longest time patient has a held a job?: couple of years Where was the patient employed at that time?: Runner, broadcasting/film/videoteacher at [employer] Has patient ever been in the Eli Lilly and Companymilitary?: No Are There Guns or Other Weapons in Your Home?: No  Financial Resources:   Surveyor, quantityinancial resources: Support from parents / caregiver Does patient have a Lawyerrepresentative payee or guardian?: No  Alcohol/Substance Abuse:   Alcohol/Substance Abuse Treatment Hx: Denies past history Has alcohol/substance abuse ever caused legal problems?: No  Social Support System:   Forensic psychologistatient's Community Support System: Good Describe Community Support System: Family, friends Type of faith/religion: Ephriam KnucklesChristian How does patient's faith help to cope with current illness?: "I gives me strength and hope.  I know in the end, everything will be alright."  Leisure/Recreation:   Leisure and Hobbies: cramics, social media, hanging out with friends  Strengths/Needs:   What things does the patient do well?: listening to others-is a good friend In what areas does patient struggle / problems for patient: pride, stubborness  Discharge Plan:   Does patient have access to transportation?: Yes Will patient be returning to same living situation after discharge?: Yes Currently receiving community mental health services: Yes (From Whom) ([facility]) Does patient have financial barriers related to discharge medications?: No  Summary/Recommendations:   Summary and Recommendations (to be completed by the evaluator): [name] is a 25 YO Caucasian female diagnosed with Bipolar D/O, manic, with psychosis.  Previous to, and during admission, she demonstrated oose associations, anxiety, mood lability and paranoia.  Once stabilized, [name] will return home and follow up outpt.  In the meantime, she can benefit from crises stabilization, medication  management, therapeutic milieu and referral for services.  Baldo Daubodney B  Lathaniel Legate. 11/03/2016

## 2016-11-03 NOTE — Progress Notes (Signed)
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  Patient seen and chart reviewed.Discussed patient with treatment team.  Pt today is seen as very pressured , labile, tangential as well as disorganized. Pt unable to give any hx and needs a lot of redirection . Pt reports she started noticing changes in herself when she was in [place] , however is unable to complete the hx. Hence majority of the information was obtained from EHR. CSW who was able to obtain collateral information from mother - reported that mother also has hx of Bipolar I , did well on Li and stelazine for years.  Discussed adding Li with patient - she agrees. Will continue to encourage and support.     Principal Problem: Bipolar disorder, current episode manic, severe with psychotic features (HCC) Diagnosis:   Patient Active Problem List   Diagnosis Date Noted  . Bipolar disorder, current episode manic, severe with psychotic features (HCC) [F31.2] 11/03/2016  . Obsessive-compulsive disorder [F42.9]    Total Time spent with patient: 30 minutes  Past Psychiatric History: Please see H&P.   Past Medical History: hx of thyroid nodule and S/P surgery while in elementary school, denies any issues now. Family History:  Family History  Problem Relation Age of Onset  . Bipolar disorder Mother   . Hypertension Father    Family Psychiatric  History: mother has Bipolar do , did well on Li and stelazine for 15 yrs , had to stop it when her renal function became abnormal. Social History: Please see H&P.  History  Alcohol Use  . Yes    Comment: occasional drinker, less than 2 drinks per week     History  Drug use:  Unknown    Social History   Social History  . Marital status: Single    Spouse name: N/A  . Number of children: N/A  . Years of education: N/A   Social History Main Topics  . Smoking status: Never Smoker  . Smokeless tobacco: Never Used  . Alcohol use Yes     Comment: occasional drinker, less than 2 drinks per week  . Drug use: Unknown  . Sexual activity: Not Asked   Other Topics Concern  . None   Social History Narrative  . None   Additional Social History:                         Sleep: Fair  Appetite:  Fair  Current Medications: Current Facility-Administered Medications  Medication Dose Route Frequency Provider Last Rate Last Dose  . alum & mag hydroxide-simeth (MAALOX/MYLANTA) 200-200-20 MG/5ML suspension 30 mL  30 mL Oral Q4H PRN Beau FannyJohn C Withrow, FNP      . ibuprofen (ADVIL,MOTRIN) tablet 600 mg  600 mg Oral Q8H PRN Beau FannyJohn C Withrow, FNP      . Influenza vac split quadrivalent PF (FLUARIX) injection 0.5 mL  0.5 mL Intramuscular Tomorrow-1000 Oneta Rackanika N Lewis, NP      . lithium carbonate capsule 300 mg  300 mg Oral BID WC Jomarie LongsSaramma Eyob Godlewski, MD      . LORazepam (  ATIVAN) tablet 1 mg  1 mg Oral Q6H PRN Adonis BrookSheila Agustin, NP   1 mg at 11/03/16 [id number]   Or  . LORazepam (ATIVAN) injection 1 mg  1 mg Intramuscular Q6H PRN Adonis BrookSheila Agustin, NP   1 mg at 11/02/16 1451  . magnesium hydroxide (MILK OF MAGNESIA) suspension 30 mL  30 mL Oral Daily PRN Beau FannyJohn C Withrow, FNP   30 mL at 11/01/16 [id number]  . nicotine (NICODERM CQ - dosed in mg/24 hours) patch 21 mg  21 mg Transdermal Daily Beau FannyJohn C Withrow, FNP   21 mg at 11/02/16 0805  . OLANZapine (ZYPREXA) tablet 10 mg  10 mg Oral TID PRN Jomarie LongsSaramma Rendell Thivierge, MD       Or  . OLANZapine (ZYPREXA) injection 10 mg  10 mg Intramuscular TID PRN Jomarie LongsSaramma Kailin Principato, MD      . ondansetron (ZOFRAN) tablet 4 mg  4 mg Oral Q8H PRN Beau FannyJohn C Withrow, FNP      . QUEtiapine (SEROQUEL) tablet 300 mg  300 mg Oral QHS Cartrell Bentsen, MD      . QUEtiapine (SEROQUEL)  tablet 50 mg  50 mg Oral TID Adonis BrookSheila Agustin, NP   50 mg at 11/03/16 0910  . white petrolatum (VASELINE) gel   Topical PRN Beau FannyJohn C Withrow, FNP        Lab Results:  Results for orders placed or performed during the hospital encounter of 10/31/16 (from the past 48 hour(s))  Lipid panel     Status: Abnormal   Collection Time: 11/02/16  6:15 AM  Result Value Ref Range   Cholesterol 108 0 - 200 mg/dL   Triglycerides 51 <540<150 mg/dL   HDL 35 (L) >98>40 mg/dL   Total CHOL/HDL Ratio 3.1 RATIO   VLDL 10 0 - 40 mg/dL   LDL Cholesterol 63 0 - 99 mg/dL    Comment:        Total Cholesterol/HDL:CHD Risk Coronary Heart Disease Risk Table                     Men   Women  1/2 Average Risk   3.4   3.3  Average Risk       5.0   4.4  2 X Average Risk   9.6   7.1  3 X Average Risk  23.4   11.0        Use the calculated Patient Ratio above and the CHD Risk Table to determine the patient's CHD Risk.        ATP III CLASSIFICATION (LDL):  <100     mg/dL   Optimal  119-147100-129  mg/dL   Near or Above                    Optimal  130-159  mg/dL   Borderline  [id number]  mg/dL   High  >130>190     mg/dL   Very High Performed at [facility]   Hemoglobin A1c     Status: None   Collection Time: 11/02/16  6:15 AM  Result Value Ref Range   Hgb A1c MFr Bld 4.8 4.8 - 5.6 %    Comment: (NOTE)         Pre-diabetes: 5.7 - 6.4         Diabetes: >6.4         Glycemic control for adults with diabetes: <7.0    Mean Plasma Glucose 91 mg/dL    Comment: (NOTE) Performed At: [facility]  [place] [address] [place], [place] [id number] Mila Homer MD WU:[id number] Performed at [facility]   Prolactin     Status: Abnormal   Collection Time: 11/02/16  6:15 AM  Result Value Ref Range   Prolactin 26.5 (H) 4.8 - 23.3 ng/mL    Comment: (NOTE) Performed At: [facility] [address] [place], [place] [id number] Mila Homer MD VH:[id number] Performed at [facility]     Blood Alcohol level:  Lab Results  Component Value Date   [facility] <5 10/30/2016    Metabolic Disorder Labs: Lab Results  Component Value Date   HGBA1C 4.8 11/02/2016   MPG 91 11/02/2016   Lab Results  Component Value Date   PROLACTIN 26.5 (H) 11/02/2016   Lab Results  Component Value Date   CHOL 108 11/02/2016   TRIG 51 11/02/2016   HDL 35 (L) 11/02/2016   CHOLHDL 3.1 11/02/2016   VLDL 10 11/02/2016   LDLCALC 63 11/02/2016    Physical Findings: AIMS: Facial and Oral Movements Muscles of Facial Expression: None, normal Lips and Perioral Area: None, normal Jaw: None, normal Tongue: None, normal,Extremity Movements Upper (arms, wrists, hands, fingers): None, normal Lower (legs, knees, ankles, toes): None, normal, Trunk Movements Neck, shoulders, hips: None, normal, Overall Severity Severity of abnormal movements (highest score from questions above): None, normal Incapacitation due to abnormal movements: None, normal Patient's awareness of abnormal movements (rate only patient's report): No Awareness, Dental Status Current problems with teeth and/or dentures?: No Does patient usually wear dentures?: No  CIWA:    COWS:     Musculoskeletal: Strength & Muscle Tone: within normal limits Gait & Station: normal Patient leans: N/A  Psychiatric Specialty Exam: Physical Exam  Nursing note and vitals reviewed.   Review of Systems  Psychiatric/Behavioral: The patient is nervous/anxious.   All other systems reviewed and are negative.   Blood pressure 118/76, pulse (!) 121, temperature 98.2 F (36.8 C), temperature source Oral, resp. rate 20, height 5\' 7"  (1.702 m), weight 62.6 kg (138 lb), last menstrual period 10/14/2016.Body mass index is 21.61 kg/m.  General Appearance: Casual  Eye Contact:  Fair  Speech:  Pressured  Volume:  Normal  Mood:  Anxious  Affect:  Labile  Thought Process:  Disorganized and Descriptions of Associations: Loose  Orientation:   Full (Time, Place, and Person)  Thought Content:  Paranoid Ideation and Rumination  Suicidal Thoughts:  No  Homicidal Thoughts:  No  Memory:  Immediate;   Fair Recent;   Poor Remote;   Poor  Judgement:  Impaired  Insight:  Fair  Psychomotor Activity:  Restlessness  Concentration:  Concentration: Fair and Attention Span: Fair  Recall:  [employer] of Knowledge:  Fair  Language:  Fair  Akathisia:  No  Handed:  Right  AIMS (if indicated):     Assets:  Desire for Improvement  ADL's:  Intact  Cognition:  WNL  Sleep:  Number of Hours: 5.75     Treatment Plan Summary:Patient today seen as manic, pressured , will start a mood stabilizer to augment the seroquel.  Bipolar disorder, current episode manic, severe with psychotic features (HCC) unstable  Will continue today 11/03/16 plan as below except where it is noted.  Daily contact with patient to assess and evaluate symptoms and progress in treatment and Medication management  Reviewed past medical records,treatment plan.   For Bipolar do : Will continue Seroquel , increase to 50 mg po bid and 300 mg  po qhs. Will add Li 300 mg po bid with meals. Will get Li level in 5 days.  For Insomnia: Serqouel 300 mg po qhs .  For anxiety/agitation: Will make available PRN medication as per agitation protocol.  Will continue to monitor vitals ,medication compliance and treatment side effects while patient is here.   Will monitor for medical issues as well as call consult as needed.   Reviewed labs lipid panel - wnl, hba1c- wnl, pl - slightly elevated , tsh - wnl.  I have reviewed EKG for qtc - wnl.   CSW will continue working on disposition.   Patient to participate in therapeutic milieu .      Jasalyn Frysinger, MD 11/03/2016, 12:23 PM

## 2016-11-03 NOTE — Tx Team (Signed)
Interdisciplinary Treatment and Diagnostic Plan Update  11/03/2016 Time of Session: 3:20 PM  Tracy Russo MRN: [id number]  Principal Diagnosis: Bipolar disorder, current episode manic, severe with psychotic features [facility])  Secondary Diagnoses: Principal Problem:   Bipolar disorder, current episode manic, severe with psychotic features ([place]) Active Problems:   Obsessive-compulsive disorder   Current Medications:  Current Facility-Administered Medications  Medication Dose Route Frequency Provider Last Rate Last Dose  . alum & mag hydroxide-simeth (MAALOX/MYLANTA) 200-200-20 MG/5ML suspension 30 mL  30 mL Oral Q4H PRN Benjamine Mola, FNP      . ibuprofen (ADVIL,MOTRIN) tablet 600 mg  600 mg Oral Q8H PRN Benjamine Mola, FNP      . Influenza vac split quadrivalent PF (FLUARIX) injection 0.5 mL  0.5 mL Intramuscular Tomorrow-1000 D[place]ill [place], NP      . lithium carbonate capsule 300 mg  300 mg Oral BID WC Saramma Eappen, MD   300 mg at 11/03/16 1226  . LORazepam (ATIVAN) tablet 1 mg  1 mg Oral Q6H PRN Kerrie Buffalo, NP   1 mg at 11/03/16 2426   Or  . LORazepam (ATIVAN) injection 1 mg  1 mg Intramuscular Q6H PRN Kerrie Buffalo, NP   1 mg at 11/02/16 1451  . magnesium hydroxide (MILK OF MAGNESIA) suspension 30 mL  30 mL Oral Daily PRN Benjamine Mola, FNP   30 mL at 11/01/16 8341  . nicotine (NICODERM CQ - dosed in mg/24 hours) patch 21 mg  21 mg Transdermal Daily Benjamine Mola, FNP   21 mg at 11/02/16 0805  . OLANZapine (ZYPREXA) tablet 10 mg  10 mg Oral TID PRN Ursula Alert, MD       Or  . OLANZapine (ZYPREXA) injection 10 mg  10 mg Intramuscular TID PRN Ursula Alert, MD      . ondansetron (ZOFRAN) tablet 4 mg  4 mg Oral Q8H PRN Benjamine Mola, FNP      . propranolol (INDERAL) tablet 10 mg  10 mg Oral BID Saramma Eappen, MD      . QUEtiapine (SEROQUEL) tablet 300 mg  300 mg Oral QHS Saramma Eappen, MD      . QUEtiapine (SEROQUEL) tablet 50 mg  50 mg Oral TID Kerrie Buffalo, NP    50 mg at 11/03/16 1226  . white petrolatum (VASELINE) gel   Topical PRN Benjamine Mola, FNP        PTA Medications: Prescriptions Prior to Admission  Medication Sig Dispense Refill Last Dose  . ARIPiprazole (ABILIFY) 15 MG tablet Take 30 mg by mouth daily.   Past Week at Unknown time  . divalproex (DEPAKOTE) 500 MG DR tablet Take 500 mg by mouth 2 (two) times daily.   Past Week at Unknown time  . LORazepam (ATIVAN) 0.5 MG tablet Take 0.5 mg by mouth 3 (three) times daily with meals.   Past Week at Unknown time  . OLANZapine (ZYPREXA) 10 MG tablet Take 20 mg by mouth at bedtime.   Past Week at Unknown time  . OLANZapine (ZYPREXA) 5 MG tablet Take 5 mg by mouth 2 (two) times daily. 5 mg with breakfast and 5 mg with lunch   Past Week at Unknown time  . PRESCRIPTION MEDICATION Take 2-4 tablets by mouth. Entumine/Clotapine - antipsychotic not available in [place]. Patient take 2-4 tablets of an unknown strength qHS for sleep.   Past Week at Unknown time    Treatment Modalities: Medication Management, Group therapy, Case management,  1  to 1 session with clinician, Psychoeducation, Recreational therapy.   Physician Treatment Plan for Primary Diagnosis: Bipolar disorder, current episode manic, severe with psychotic features ([place]) Long Term Goal(s): Improvement in symptoms so as ready for discharge  Short Term Goals:  Compliance with prescribed medications will improve   Medication Management: Evaluate patient's response, side effects, and tolerance of medication regimen.  Therapeutic Interventions: 1 to 1 sessions, Unit Group sessions and Medication administration.  Evaluation of Outcomes: Progressing  Physician Treatment Plan for Secondary Diagnosis: Principal Problem:   Bipolar disorder, current episode manic, severe with psychotic features ([place]) Active Problems:   Obsessive-compulsive disorder   Long Term Goal(s): Improvement in symptoms so as ready for discharge  Short Term Goals:  Ability to identify changes in lifestyle to reduce recurrence of condition will improve   Medication Management: Evaluate patient's response, side effects, and tolerance of medication regimen.  Therapeutic Interventions: 1 to 1 sessions, Unit Group sessions and Medication administration.  Evaluation of Outcomes: Progressing   RN Treatment Plan for Primary Diagnosis: Bipolar disorder, current episode manic, severe with psychotic features ([place]) Long Term Goal(s): Knowledge of disease and therapeutic regimen to maintain health will improve  Short Term Goals: Ability to demonstrate self-control and Compliance with prescribed medications will improve  Medication Management: RN will administer medications as ordered by provider, will assess and evaluate patient's response and provide education to patient for prescribed medication. RN will report any adverse and/or side effects to prescribing provider.  Therapeutic Interventions: 1 on 1 counseling sessions, Psychoeducation, Medication administration, Evaluate responses to treatment, Monitor vital signs and CBGs as ordered, Perform/monitor CIWA, COWS, AIMS and Fall Risk screenings as ordered, Perform wound care treatments as ordered.  Evaluation of Outcomes: Progressing   LCSW Treatment Plan for Primary Diagnosis: Bipolar disorder, current episode manic, severe with psychotic features ([place]) Long Term Goal(s): Safe transition to appropriate next level of care at discharge, Engage patient in therapeutic group addressing interpersonal concerns.  Short Term Goals: Engage patient in aftercare planning with referrals and resources  Therapeutic Interventions: Assess for all discharge needs, 1 to 1 time with Social worker, Explore available resources and support systems, Assess for adequacy in community support network, Educate family and significant other(s) on suicide prevention, Complete Psychosocial Assessment, Interpersonal group  therapy.  Evaluation of Outcomes: Met   Progress in Treatment: Attending groups: No Participating in groups: No Taking medication as prescribed: Yes Toleration medication: Yes, no side effects reported at this time Family/Significant other contact made: Yes Patient understands diagnosis: No  Limited insight Discussing patient identified problems/goals with staff: Yes Medical problems stabilized or resolved: Yes Denies suicidal/homicidal ideation: Yes Issues/concerns per patient self-inventory: None Other: N/A  New problem(s) identified: None identified at this time.   New Short Term/Long Term Goal(s): None identified at this time.   Discharge Plan or Barriers:  Return home, follow up outpt  Reason for Continuation of Hospitalization: Mania  Medication stabilization   Estimated Length of Stay: 3-5 days  Attendees: Patient: 11/03/2016  3:20 PM  Physician: Ursula Alert, MD 11/03/2016  3:20 PM  Nursing: Hoy Register, RN 11/03/2016  3:20 PM  RN Care Manager: Lars Pinks, RN 11/03/2016  3:20 PM  Social Worker: Ripley Fraise 11/03/2016  3:20 PM  Recreational Therapist: Laretta Bolster  11/03/2016  3:20 PM  Other: Norberto Sorenson 11/03/2016  3:20 PM  Other:  11/03/2016  3:20 PM    Scribe for Treatment Team:  Roque Lias 11/03/2016 3:20 PM

## 2016-11-03 NOTE — Progress Notes (Signed)
Did not attend group 

## 2016-11-03 NOTE — Plan of Care (Signed)
Problem: Health Behavior/Discharge Planning: Goal: Compliance with prescribed medication regimen will improve Outcome: Progressing Pt is compliant with medications.  Problem: Safety: Goal: Ability to remain free from injury will improve Outcome: Progressing Pt has not engaged in aggressive or self-harming behaviors this shift.

## 2016-11-03 NOTE — Progress Notes (Signed)
D: Tracy Russo has been compliant with PO medications today, including PRNs. She denies SI, HI, AVH, and pain. In conversation, she is tangential and disorganized. 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 She has been redirectable and her behavior has been calmer and more appropriate than was noted and reported yesterday.  A: Meds given as ordered, including PRN Zyprexa this morning for anxiety/agitation with positive results. Q15 safety checks maintained. Support/encouragement offered. R: Pt remains free from harm and continues with treatment. Will continue to monitor for needs/safety.

## 2016-11-04 MED ORDER — PROPRANOLOL HCL 10 MG PO TABS
10.0000 mg | ORAL_TABLET | Freq: Three times a day (TID) | ORAL | Status: DC
  Administered 2016-11-04 – 2016-11-15 (×33): 10 mg via ORAL
  Filled 2016-11-04 (×40): qty 1

## 2016-11-04 MED ORDER — QUETIAPINE FUMARATE 400 MG PO TABS
400.0000 mg | ORAL_TABLET | Freq: Every day | ORAL | Status: DC
  Administered 2016-11-04 – 2016-11-06 (×2): 400 mg via ORAL
  Filled 2016-11-04 (×3): qty 1

## 2016-11-04 NOTE — Progress Notes (Signed)
DAR NOTE: Patient presents with anxious affect and  mood.  Patient was hyper verbal, speech tangential at times.  Patient was found walking around in her room naked this morning.  Denies pain, auditory and visual hallucinations.  Described energy level as normal and concentration as good.  Rates depression at 0, hopelessness at 0, and anxiety at 0.  Maintained on routine safety checks.  Medications given as prescribed.  Support and encouragement offered as needed.  Attended group and participated.  States goal for today is "getting to meet with the doctor."  Patient observed socializing with peers in the dayroom.  Patient continues to need a lot of redirection on the unit.   Patient given Ativan 1 mg for agitation with good effect.

## 2016-11-04 NOTE — Progress Notes (Signed)
D: Pt denies SI/HI/AVH. Pt is pleasant and cooperative. Pt was lying down most of the evening, when pt got up pt continued to be hypo-manic. Pt has been redirectable this evening .   A: Pt was offered support and encouragement. Pt was given scheduled medications. Pt was encourage to attend groups. Q 15 minute checks were done for safety.   R:Pt attends groups and interacts well with peers and staff. Pt is taking medication. Pt has no complaints.Pt receptive to treatment and safety maintained on unit.

## 2016-11-04 NOTE — Progress Notes (Signed)
Pt woke up very manic, pt changing clothes, pt putting clothes in the toilet, pt stated she has so many thoughts in her head she has to get out. Pt was given 1 mg of ativan PRN, will monitor.

## 2016-11-04 NOTE — Progress Notes (Signed)
[facility] MD Progress Note  11/04/2016 3:06 PM Tracy Russo  MRN:  [id number] Subjective:  Pt states " I feel a bit better , but I still need to get more stable. 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  Patient seen and chart reviewed.Discussed patient with treatment team.  Pt today continues to be seen as pressured , loose , tangential - with some improvement since yesterday. Pt continues to be manic - anxious with racing thoughts. Pt with good insight - wants to get better , and is willing to take medications as prescribed. Pt currently on Li as well as seroquel , tolerating it well. Pt denies any ADRs. Per staff - pt continues to need a lot of encouragement and support as well as redirection on the unit.   Reviewed records obtained from [place]  [facility] mental health facility : As per records pt was admitted there from another facility [facility]( [facility]) on November 17 th , with a manic episode . Pt at that time presented as delusional , with sexual disinhibition , high energy ) was started on olanzapine titrated up to 30 mg per day , aripiprazol 30 mg as well as Depakine ER 500 mg , Lorazepam 5 mg po tid , etumina qhs as well as received IM Clopixol Acufase 50 mg prior to transfer to [place] with her parents on November 28 th.       Principal Problem: Bipolar disorder, current episode manic, severe with psychotic features (HCC) Diagnosis:   Patient Active Problem List   Diagnosis Date Noted  . Bipolar disorder, current episode manic, severe with psychotic features (HCC) [F31.2] 11/03/2016  . Obsessive-compulsive disorder [F42.9]    Total Time spent with patient: 30 minutes  Past Psychiatric History: Please see H&P.   Past Medical History: hx  of thyroid nodule and S/P surgery while in elementary school, denies any issues now. Family History:  Family History  Problem Relation Age of Onset  . Bipolar disorder Mother   . Hypertension Father    Family Psychiatric  History: mother has Bipolar do , did well on Li and stelazine for 15 yrs , had to stop it when her renal function became abnormal. Social History: Please see H&P.  History  Alcohol Use  . Yes    Comment: occasional drinker, less than 2 drinks per week     History  Drug use: Unknown    Social History   Social History  . Marital status: Single    Spouse name: N/A  . Number of children: N/A  . Years of education: N/A   Social History Main Topics  . Smoking status: Never Smoker  . Smokeless tobacco: Never Used  . Alcohol use Yes     Comment: occasional drinker, less than 2 drinks per week  . Drug use: Unknown  . Sexual activity: Not Asked   Other Topics Concern  . None   Social History Narrative  . None   Additional Social History:                         Sleep: Fair  Appetite:  Fair  Current Medications: Current Facility-Administered Medications  Medication Dose Route Frequency Provider Last Rate Last Dose  . alum & mag hydroxide-simeth (MAALOX/MYLANTA) 200-200-20 MG/5ML suspension 30 mL  30 mL Oral Q4H PRN Beau FannyJohn C Withrow, FNP      . ibuprofen (ADVIL,MOTRIN) tablet 600 mg  600 mg Oral Q8H PRN Beau FannyJohn C Withrow, FNP      . Influenza vac split quadrivalent PF (FLUARIX) injection 0.5 mL  0.5 mL Intramuscular Tomorrow-1000 Oneta Rackanika N Lewis, NP      . lithium carbonate capsule 300 mg  300 mg Oral BID WC Jomarie LongsSaramma Llewellyn Choplin, MD   300 mg at 11/04/16 0813  . LORazepam (ATIVAN) tablet 1 mg  1 mg Oral Q6H PRN Adonis BrookSheila Agustin, NP   1 mg at 11/04/16 [id number]   Or  . LORazepam (ATIVAN) injection 1 mg  1 mg Intramuscular Q6H PRN Adonis BrookSheila Agustin, NP   1 mg at 11/02/16 1451  . magnesium hydroxide (MILK OF MAGNESIA) suspension 30 mL  30 mL Oral Daily PRN Beau FannyJohn C  Withrow, FNP   30 mL at 11/01/16 [id number]  . nicotine (NICODERM CQ - dosed in mg/24 hours) patch 21 mg  21 mg Transdermal Daily Beau FannyJohn C Withrow, FNP   21 mg at 11/04/16 0813  . OLANZapine (ZYPREXA) tablet 10 mg  10 mg Oral TID PRN Jomarie LongsSaramma Shamela Haydon, MD   10 mg at 11/04/16 1113   Or  . OLANZapine (ZYPREXA) injection 10 mg  10 mg Intramuscular TID PRN Jomarie LongsSaramma Divon Krabill, MD      . ondansetron (ZOFRAN) tablet 4 mg  4 mg Oral Q8H PRN Beau FannyJohn C Withrow, FNP   4 mg at 11/03/16 2203  . propranolol (INDERAL) tablet 10 mg  10 mg Oral TID Jomarie LongsSaramma Latasia Silberstein, MD      . QUEtiapine (SEROQUEL) tablet 400 mg  400 mg Oral QHS Jolynn Bajorek, MD      . QUEtiapine (SEROQUEL) tablet 50 mg  50 mg Oral TID Adonis BrookSheila Agustin, NP   50 mg at 11/04/16 1346  . white petrolatum (VASELINE) gel   Topical PRN Beau FannyJohn C Withrow, FNP        Lab Results:  No results found for this or any previous visit (from the past 48 hour(s)).  Blood Alcohol level:  Lab Results  Component Value Date   ETH <5 10/30/2016    Metabolic Disorder Labs: Lab Results  Component Value Date   HGBA1C 4.8 11/02/2016   MPG 91 11/02/2016   Lab Results  Component Value Date   PROLACTIN 26.5 (H) 11/02/2016   Lab Results  Component Value Date   CHOL 108 11/02/2016   TRIG 51 11/02/2016   HDL 35 (L) 11/02/2016   CHOLHDL 3.1 11/02/2016   VLDL 10 11/02/2016   LDLCALC 63 11/02/2016    Physical Findings: AIMS: Facial and Oral Movements Muscles of Facial Expression: None, normal Lips and Perioral Area: None, normal Jaw: None, normal Tongue: None, normal,Extremity Movements Upper (arms, wrists, hands, fingers): None, normal Lower (legs, knees, ankles, toes): None, normal, Trunk Movements Neck, shoulders, hips: None, normal, Overall Severity Severity of abnormal movements (highest score from questions above): None, normal Incapacitation due to abnormal movements: None, normal Patient's awareness of abnormal movements (rate only patient's report): No Awareness,  Dental Status Current problems with teeth and/or dentures?: No Does patient usually wear dentures?: No  CIWA:    COWS:     Musculoskeletal: Strength & Muscle Tone: within normal limits Gait & Station: normal Patient leans: N/A  Psychiatric Specialty Exam: Physical Exam  Nursing note and  vitals reviewed.   Review of Systems  Psychiatric/Behavioral: The patient is nervous/anxious.   All other systems reviewed and are negative.   Blood pressure 125/84, pulse (!) 119, temperature 98.4 F (36.9 C), temperature source Oral, resp. rate 18, height 5\' 7"  (1.702 m), weight 62.6 kg (138 lb), last menstrual period 10/14/2016.Body mass index is 21.61 kg/m.  General Appearance: Casual  Eye Contact:  Fair  Speech:  Pressured  Volume:  Normal  Mood:  Anxious improving  Affect:  Labile  Thought Process:  Disorganized and Descriptions of Associations: Tangential  Orientation:  Full (Time, Place, and Person)  Thought Content:  Paranoid Ideation and Rumination improving  Suicidal Thoughts:  No  Homicidal Thoughts:  No  Memory:  Immediate;   Fair Recent;   Poor Remote;   Poor  Judgement:  Impaired  Insight:  Fair  Psychomotor Activity:  Restlessness  Concentration:  Concentration: Fair and Attention Span: Fair  Recall:  [employer] of Knowledge:  Fair  Language:  Fair  Akathisia:  No  Handed:  Right  AIMS (if indicated):     Assets:  Desire for Improvement  ADL's:  Intact  Cognition:  WNL  Sleep:  Number of Hours: 5.5     Treatment Plan Summary:Patient today seen as manic, pressured ,with some improvement , tolerating medications well , continue treatment. I have reviewed her Medical records from [place] - please see above.  Bipolar disorder, current episode manic, severe with psychotic features (HCC) unstable  Will continue today 11/04/16 plan as below except where it is noted.  Daily contact with patient to assess and evaluate symptoms and progress in treatment and Medication  management  Reviewed past medical records,treatment plan.   For Bipolar do : Will continue Seroquel , increase to 50 mg po bid and 400 mg po qhs. Will continue Li 300 mg po bid with meals. Will get Li level in 5 days.  For Insomnia: Serqouel 400 mg po qhs .  For anxiety/agitation: Will continue PRN medication as per agitation protocol.  Will continue to monitor vitals ,medication compliance and treatment side effects while patient is here.   Will monitor for medical issues as well as call consult as needed.   Reviewed labs lipid panel - wnl, hba1c- wnl, pl - slightly elevated , tsh - wnl.  I have reviewed EKG for qtc - wnl.   CSW will continue working on disposition.   Patient to participate in therapeutic milieu .      Bayan Hedstrom, MD 11/04/2016, 3:06 PM

## 2016-11-04 NOTE — Progress Notes (Signed)
Recreation Therapy Notes  Date: 11/04/16 Time: 1000 Location: 500 Hall Dayroom  Group Topic: Leisure Education  Goal Area(s) Addresses:  Patient will identify positive leisure activities.  Patient will identify one positive benefit of participation in leisure activities.   Behavioral Response: Engaged, anxious  Intervention: Various leisure activities on strips of paper, dry erase board, dry erase marker, stopwatch   Activity: Leisure Pictionary.  Patients were divided into teams of 2 and given one minute to guess as many activities as possible.  One person from Team 1 would come up, select a strip of paper from the can and draw the activity on the board or act it out.  If their team guessed the activity, the person was to get another activity from the can.  This would continue until the time ran out.  Whatever activity they ended on, if they were unable to guess the activity, Team 2 would get the opportunity to "steal" the point.  Team 2 would start the process over again.  The team with the most points wins.  Education:  Leisure Education, Building control surveyorDischarge Planning  Education Outcome: Acknowledges education/In group clarification offered/Needs additional education  Clinical Observations/Feedback: Pt stated "leisure is a part of the culture".  Pt was engaged and seemed very into the activity.  Pt became a little anxious and started going in and out of group.  Pt also became preoccupied with getting some socks.    Caroll RancherMarjette Keesha Pellum, LRT/CTRS      Caroll RancherLindsay, Alydia Gosser A 11/04/2016 12:06 PM

## 2016-11-04 NOTE — [facility] Group Notes (Signed)
[facility]  11/04/2016 , 1:35 PM    Type of Therapy:  Mental Health Association Presentation  Participation Level:  Active  Participation Quality:  Attentive  Affect:  Blunted  Cognitive:  Oriented  Insight:  Limited  Engagement in Therapy:  Engaged  Modes of Intervention:  Discussion, Education and Socialization  Summary of Progress/Problems:  Tracy Russo from [employer] came to present his recovery story and play the guitar.  Attended.  Engaged while there but was in and out of group multiple times.  Apologized afterwards.  Tracy Russo, Tracy Russo 11/04/2016 , 1:35 PM

## 2016-11-05 MED ORDER — QUETIAPINE FUMARATE 25 MG PO TABS
25.0000 mg | ORAL_TABLET | Freq: Three times a day (TID) | ORAL | Status: DC
  Administered 2016-11-05 – 2016-11-06 (×3): 25 mg via ORAL
  Filled 2016-11-05 (×7): qty 1

## 2016-11-05 MED ORDER — SIMETHICONE 80 MG PO CHEW
80.0000 mg | CHEWABLE_TABLET | Freq: Four times a day (QID) | ORAL | Status: DC | PRN
  Administered 2016-11-14: 80 mg via ORAL
  Filled 2016-11-05: qty 1

## 2016-11-05 MED ORDER — HYDROXYZINE HCL 25 MG PO TABS
25.0000 mg | ORAL_TABLET | Freq: Four times a day (QID) | ORAL | Status: DC | PRN
  Administered 2016-11-06 – 2016-11-15 (×7): 25 mg via ORAL
  Filled 2016-11-05 (×7): qty 1

## 2016-11-05 NOTE — Progress Notes (Signed)
Patient ID: Tracy Russo, female   DOB: 02/07/1991, 25 y.o.   MRN: [id number] D: Client visible on the unit, seen with parents, reports "it's been a better day" "this weird song been playing in my head all day" "I just need to get off this hall, need some way to channel this energy, exercise or do something" "I feel caged in makes me anxious" A:Writer provided emotional support encouraged client to speak to doctor about unit restriction, also encouraged client to do some arm exercises in her room or just walk the hall a few moments to help relieve energy. Medications reviewed, administered as ordered. Staff will monitor q6215min for safety. R: Client is safe on the unit. Client has to be continually redirected.

## 2016-11-05 NOTE — Plan of Care (Signed)
Problem: Safety: Goal: Ability to redirect hostility and anger into socially appropriate behaviors will improve Outcome: Progressing Pt safe on the unit at this time, pt has been appropriate this evening

## 2016-11-05 NOTE — Progress Notes (Signed)
[facility] MD Progress Note  11/05/2016 12:41 PM Tracy Russo  MRN:  [id number] Subjective:  Pt states " I feel ok, I do feel restless and something in my tummy , this weird feeling , I cannot explain."     Objective: Tracy Russo is a 41 y old CF , who is single , who was recently diagnosed with Bipolar do , was doing a teaching job in [place] , was brought in for behavioral changes to [facility].  Patient seen and chart reviewed.Discussed patient with treatment team.  Pt seen as pressured , however is more redirectable, her thought process more linear. Pt continues to be labile often , reports some anxiety and racing heart rate at times. Pt advised to make use of PRN medications . Per staff - pt is anxious often, sleep is improved. Will continue to encourage and support.    Principal Problem: Bipolar disorder, current episode manic, severe with psychotic features (HCC) Diagnosis:   Patient Active Problem List   Diagnosis Date Noted  . Bipolar disorder, current episode manic, severe with psychotic features (HCC) [F31.2] 11/03/2016  . Obsessive-compulsive disorder [F42.9]    Total Time spent with patient: 30 minutes  Past Psychiatric History: Please see H&P.   Past Medical History: hx of thyroid nodule and S/P surgery while in elementary school, denies any issues now. Family History:  Family History  Problem Relation Age of Onset  . Bipolar disorder Mother   . Hypertension Father    Family Psychiatric  History: mother has Bipolar do , did well on Li and stelazine for 15 yrs , had to stop it when her renal function became abnormal. Social History: Please see H&P.  History  Alcohol Use  . Yes    Comment: occasional drinker, less than 2 drinks per week     History  Drug use: Unknown    Social History   Social History  . Marital status: Single    Spouse name: N/A  . Number of children: N/A  . Years of education: N/A   Social History Main Topics  . Smoking status: Never  Smoker  . Smokeless tobacco: Never Used  . Alcohol use Yes     Comment: occasional drinker, less than 2 drinks per week  . Drug use: Unknown  . Sexual activity: Not Asked   Other Topics Concern  . None   Social History Narrative  . None   Additional Social History:                         Sleep: Fair  Appetite:  Fair  Current Medications: Current Facility-Administered Medications  Medication Dose Route Frequency Provider Last Rate Last Dose  . alum & mag hydroxide-simeth (MAALOX/MYLANTA) 200-200-20 MG/5ML suspension 30 mL  30 mL Oral Q4H PRN Beau Fanny, FNP      . hydrOXYzine (ATARAX/VISTARIL) tablet 25 mg  25 mg Oral Q6H PRN Jomarie Longs, MD      . ibuprofen (ADVIL,MOTRIN) tablet 600 mg  600 mg Oral Q8H PRN Beau Fanny, FNP      . Influenza vac split quadrivalent PF (FLUARIX) injection 0.5 mL  0.5 mL Intramuscular Tomorrow-1000 Oneta Rack, NP      . lithium carbonate capsule 300 mg  300 mg Oral BID WC Jomarie Longs, MD   300 mg at 11/05/16 0733  . LORazepam (ATIVAN) tablet 1 mg  1 mg Oral Q6H PRN Adonis Brook, NP   1 mg at  11/05/16 0013   Or  . LORazepam (ATIVAN) injection 1 mg  1 mg Intramuscular Q6H PRN Adonis BrookSheila Agustin, NP   1 mg at 11/02/16 1451  . magnesium hydroxide (MILK OF MAGNESIA) suspension 30 mL  30 mL Oral Daily PRN Beau FannyJohn C Withrow, FNP   30 mL at 11/01/16 [id number]  . nicotine (NICODERM CQ - dosed in mg/24 hours) patch 21 mg  21 mg Transdermal Daily Beau FannyJohn C Withrow, FNP   21 mg at 11/04/16 0813  . OLANZapine (ZYPREXA) tablet 10 mg  10 mg Oral TID PRN Jomarie LongsSaramma Lenon Kuennen, MD   10 mg at 11/04/16 1113   Or  . OLANZapine (ZYPREXA) injection 10 mg  10 mg Intramuscular TID PRN Jomarie LongsSaramma Kearsten Ginther, MD      . ondansetron (ZOFRAN) tablet 4 mg  4 mg Oral Q8H PRN Beau FannyJohn C Withrow, FNP   4 mg at 11/03/16 2203  . propranolol (INDERAL) tablet 10 mg  10 mg Oral TID Jomarie LongsSaramma Skye Rodarte, MD   10 mg at 11/05/16 1113  . QUEtiapine (SEROQUEL) tablet 25 mg  25 mg Oral TID Jomarie LongsSaramma  Jden Want, MD   25 mg at 11/05/16 1119  . QUEtiapine (SEROQUEL) tablet 400 mg  400 mg Oral QHS Jomarie LongsSaramma Shalene Gallen, MD   400 mg at 11/04/16 2119  . simethicone (MYLICON) chewable tablet 80 mg  80 mg Oral QID PRN Jomarie LongsSaramma Sky Primo, MD      . white petrolatum (VASELINE) gel   Topical PRN Beau FannyJohn C Withrow, FNP        Lab Results:  No results found for this or any previous visit (from the past 48 hour(s)).  Blood Alcohol level:  Lab Results  Component Value Date   ETH <5 10/30/2016    Metabolic Disorder Labs: Lab Results  Component Value Date   HGBA1C 4.8 11/02/2016   MPG 91 11/02/2016   Lab Results  Component Value Date   PROLACTIN 26.5 (H) 11/02/2016   Lab Results  Component Value Date   CHOL 108 11/02/2016   TRIG 51 11/02/2016   HDL 35 (L) 11/02/2016   CHOLHDL 3.1 11/02/2016   VLDL 10 11/02/2016   LDLCALC 63 11/02/2016    Physical Findings: AIMS: Facial and Oral Movements Muscles of Facial Expression: None, normal Lips and Perioral Area: None, normal Jaw: None, normal Tongue: None, normal,Extremity Movements Upper (arms, wrists, hands, fingers): None, normal Lower (legs, knees, ankles, toes): None, normal, Trunk Movements Neck, shoulders, hips: None, normal, Overall Severity Severity of abnormal movements (highest score from questions above): None, normal Incapacitation due to abnormal movements: None, normal Patient's awareness of abnormal movements (rate only patient's report): No Awareness, Dental Status Current problems with teeth and/or dentures?: No Does patient usually wear dentures?: No  CIWA:    COWS:     Musculoskeletal: Strength & Muscle Tone: within normal limits Gait & Station: normal Patient leans: N/A  Psychiatric Specialty Exam: Physical Exam  Nursing note and vitals reviewed.   Review of Systems  Gastrointestinal: Positive for heartburn.  Psychiatric/Behavioral: The patient is nervous/anxious.   All other systems reviewed and are negative.   Blood  pressure 107/79, pulse 81, temperature 98.4 F (36.9 C), temperature source Oral, resp. rate 20, height 5\' 7"  (1.702 m), weight 62.6 kg (138 lb), last menstrual period 10/14/2016.Body mass index is 21.61 kg/m.  General Appearance: Casual  Eye Contact:  Fair  Speech:  Pressured  Volume:  Normal  Mood:  Anxious improving  Affect:  Labile  Thought Process:  Disorganized and Descriptions  of Associations: Tangential more redirectable  Orientation:  Full (Time, Place, and Person)  Thought Content:  Paranoid Ideation and Rumination improving  Suicidal Thoughts:  No  Homicidal Thoughts:  No  Memory:  Immediate;   Fair Recent;   Fair Remote;   Fair  Judgement:  Impaired  Insight:  Fair  Psychomotor Activity:  Restlessness  Concentration:  Concentration: Poor and Attention Span: Fair  Recall:  [employer]  Fund of Knowledge:  Fair  Language:  Fair  Akathisia:  No  Handed:  Right  AIMS (if indicated):     Assets:  Desire for Improvement  ADL's:  Intact  Cognition:  WNL  Sleep:  Number of Hours: 2.25   11/05/16 : Mother contacted Clinical research associatewriter - wanted an update about patients progress. Discussed that she is showing some progress on the medications . Also she wanted to know if Clinical research associatewriter received records from [place].    11/04/16 Reviewed records obtained from [place]  [facility] mental health facility : As per records pt was admitted there from another facility [facility]( [facility]) on November 17 th , with a manic episode . Pt at that time presented as delusional , with sexual disinhibition , high energy ) was started on olanzapine titrated up to 30 mg per day , aripiprazol 30 mg as well as Depakine ER 500 mg , Lorazepam 5 mg po tid , etumina qhs as well as received IM Clopixol Acufase 50 mg prior to transfer to [place] with her parents on November 28 th.       Treatment Plan Summary:Patient today seen as pressured , restless at times , but making progress. Will continue  treatment.   Bipolar disorder, current episode manic, severe with psychotic features (HCC) unstable  Will continue today 11/05/16 plan as below except where it is noted.  Daily contact with patient to assess and evaluate symptoms and progress in treatment and Medication management  Reviewed past medical records,treatment plan.   For Bipolar do : Will continue Seroquel , reduce to 25 mg po bid and 400 mg po qhs. Dose reduced due to tachycardia and tiredness. Will continue Li 300 mg po bid with meals.  Li level on 11/08/16  For Insomnia: Serqouel 400 mg po qhs .  For anxiety/agitation: Will continue PRN medication as per agitation protocol.  Will continue to monitor vitals ,medication compliance and treatment side effects while patient is here.   Will monitor for medical issues as well as call consult as needed.   Reviewed labs lipid panel - wnl, hba1c- wnl, pl - slightly elevated , tsh - wnl.  I have reviewed EKG for qtc - wnl.   CSW will continue working on disposition.   Patient to participate in therapeutic milieu .      Khoen Genet, MD 11/05/2016, 12:41 PM

## 2016-11-05 NOTE — Progress Notes (Signed)
D: Pt denies SI/HI/AVH. Pt is pleasant and cooperative. Pt stayed in room majority of the evening, pt has been sleep most of the early evening.   A: Pt was offered support and encouragement. Pt was given scheduled medications. Pt was encourage to attend groups. Q 15 minute checks were done for safety.   R: Pt is taking medication. Pt has no complaints.Pt receptive to treatment and safety maintained on unit.

## 2016-11-05 NOTE — Progress Notes (Signed)
Recreation Therapy Notes  Date: 11/05/16 Time: 1000 Location: 500 Hall Dayroom  Group Topic: Self-Esteem  Goal Area(s) Addresses:  Patient will identify positive ways to increase self-esteem. Patient will verbalize benefit of increased self-esteem.  Behavioral Response: Engaged  Intervention: Scientist, clinical (histocompatibility and immunogenetics)Construction paper, scissors, glue sticks, magazines  Activity: Advertising.  Patients were to create an advertisement highlighting the positive qualities and traits about themselves.  Patients were allowed to use pictures, words or a combination of both.  If patients have trouble thinking of positive qualities about themselves, they should come up with traits they would want to see in a friend.  Education:  Self-Esteem, Building control surveyorDischarge Planning.   Education Outcome: Acknowledges education/In group clarification offered/Needs additional education  Clinical Observations/Feedback: Pt arrived late.  Pt advertised she wants to contribute in life, has love for all people and doesn't want violence in the world.   Caroll RancherMarjette Shandon [place], LRT/CTRS         Caroll RancherLindsay, Khole Arterburn A 11/05/2016 12:19 PM

## 2016-11-05 NOTE — Tx Team (Signed)
Interdisciplinary Treatment and Diagnostic Plan Update  11/05/2016 Time of Session: 8:43 AM  Tracy Russo MRN: [id number]  Principal Diagnosis: Bipolar disorder, current episode manic, severe with psychotic features ([place])  Secondary Diagnoses: Principal Problem:   Bipolar disorder, current episode manic, severe with psychotic features ([place]) Active Problems:   Obsessive-compulsive disorder   Current Medications:  Current Facility-Administered Medications  Medication Dose Route Frequency Provider Last Rate Last Dose  . alum & mag hydroxide-simeth (MAALOX/MYLANTA) 200-200-20 MG/5ML suspension 30 mL  30 mL Oral Q4H PRN Benjamine Mola, FNP      . ibuprofen (ADVIL,MOTRIN) tablet 600 mg  600 mg Oral Q8H PRN Benjamine Mola, FNP      . Influenza vac split quadrivalent PF (FLUARIX) injection 0.5 mL  0.5 mL Intramuscular Tomorrow-1000 Derrill Center, NP      . lithium carbonate capsule 300 mg  300 mg Oral BID WC Ursula Alert, MD   300 mg at 11/05/16 0733  . LORazepam (ATIVAN) tablet 1 mg  1 mg Oral Q6H PRN Kerrie Buffalo, NP   1 mg at 11/05/16 0013   Or  . LORazepam (ATIVAN) injection 1 mg  1 mg Intramuscular Q6H PRN Kerrie Buffalo, NP   1 mg at 11/02/16 1451  . magnesium hydroxide (MILK OF MAGNESIA) suspension 30 mL  30 mL Oral Daily PRN Benjamine Mola, FNP   30 mL at 11/01/16 8921  . nicotine (NICODERM CQ - dosed in mg/24 hours) patch 21 mg  21 mg Transdermal Daily Benjamine Mola, FNP   21 mg at 11/04/16 0813  . OLANZapine (ZYPREXA) tablet 10 mg  10 mg Oral TID PRN Ursula Alert, MD   10 mg at 11/04/16 1113   Or  . OLANZapine (ZYPREXA) injection 10 mg  10 mg Intramuscular TID PRN Ursula Alert, MD      . ondansetron (ZOFRAN) tablet 4 mg  4 mg Oral Q8H PRN Benjamine Mola, FNP   4 mg at 11/03/16 2203  . propranolol (INDERAL) tablet 10 mg  10 mg Oral TID Ursula Alert, MD   10 mg at 11/05/16 0733  . QUEtiapine (SEROQUEL) tablet 400 mg  400 mg Oral QHS Ursula Alert, MD   400 mg at  11/04/16 2119  . QUEtiapine (SEROQUEL) tablet 50 mg  50 mg Oral TID Kerrie Buffalo, NP   50 mg at 11/05/16 0733  . white petrolatum (VASELINE) gel   Topical PRN Benjamine Mola, FNP        PTA Medications: Prescriptions Prior to Admission  Medication Sig Dispense Refill Last Dose  . ARIPiprazole (ABILIFY) 15 MG tablet Take 30 mg by mouth daily.   Past Week at Unknown time  . divalproex (DEPAKOTE) 500 MG DR tablet Take 500 mg by mouth 2 (two) times daily.   Past Week at Unknown time  . LORazepam (ATIVAN) 0.5 MG tablet Take 0.5 mg by mouth 3 (three) times daily with meals.   Past Week at Unknown time  . OLANZapine (ZYPREXA) 10 MG tablet Take 20 mg by mouth at bedtime.   Past Week at Unknown time  . OLANZapine (ZYPREXA) 5 MG tablet Take 5 mg by mouth 2 (two) times daily. 5 mg with breakfast and 5 mg with lunch   Past Week at Unknown time  . PRESCRIPTION MEDICATION Take 2-4 tablets by mouth. Entumine/Clotapine - antipsychotic not available in [place]. Patient take 2-4 tablets of an unknown strength qHS for sleep.   Past Week at Unknown time  Treatment Modalities: Medication Management, Group therapy, Case management,  1 to 1 session with clinician, Psychoeducation, Recreational therapy.   Physician Treatment Plan for Primary Diagnosis: Bipolar disorder, current episode manic, severe with psychotic features ([place]) Long Term Goal(s): Improvement in symptoms so as ready for discharge  Short Term Goals:  Compliance with prescribed medications will improve   Medication Management: Evaluate patient's response, side effects, and tolerance of medication regimen.  Therapeutic Interventions: 1 to 1 sessions, Unit Group sessions and Medication administration.  Evaluation of Outcomes: Progressing   12/7:  Patient today seen as pressured , restless at times , but making progress. For Bipolar do : Will continue Seroquel , reduce to 25 mg po bid and 400 mg po qhs. Dose reduced due to tachycardia and  tiredness. Will continue Li 300 mg po bid with meals.  Li level on 11/08/16 For Insomnia: Serqouel 400 mg po qhs .  Physician Treatment Plan for Secondary Diagnosis: Principal Problem:   Bipolar disorder, current episode manic, severe with psychotic features ([place]) Active Problems:   Obsessive-compulsive disorder   Long Term Goal(s): Improvement in symptoms so as ready for discharge  Short Term Goals: Ability to identify changes in lifestyle to reduce recurrence of condition will improve   Medication Management: Evaluate patient's response, side effects, and tolerance of medication regimen.  Therapeutic Interventions: 1 to 1 sessions, Unit Group sessions and Medication administration.  Evaluation of Outcomes: Progressing   RN Treatment Plan for Primary Diagnosis: Bipolar disorder, current episode manic, severe with psychotic features ([place]) Long Term Goal(s): Knowledge of disease and therapeutic regimen to maintain health will improve  Short Term Goals: Ability to demonstrate self-control and Compliance with prescribed medications will improve  Medication Management: RN will administer medications as ordered by provider, will assess and evaluate patient's response and provide education to patient for prescribed medication. RN will report any adverse and/or side effects to prescribing provider.  Therapeutic Interventions: 1 on 1 counseling sessions, Psychoeducation, Medication administration, Evaluate responses to treatment, Monitor vital signs and CBGs as ordered, Perform/monitor CIWA, COWS, AIMS and Fall Risk screenings as ordered, Perform wound care treatments as ordered.  Evaluation of Outcomes: Progressing   LCSW Treatment Plan for Primary Diagnosis: Bipolar disorder, current episode manic, severe with psychotic features ([place]) Long Term Goal(s): Safe transition to appropriate next level of care at discharge, Engage patient in therapeutic group addressing interpersonal  concerns.  Short Term Goals: Engage patient in aftercare planning with referrals and resources  Therapeutic Interventions: Assess for all discharge needs, 1 to 1 time with Social worker, Explore available resources and support systems, Assess for adequacy in community support network, Educate family and significant other(s) on suicide prevention, Complete Psychosocial Assessment, Interpersonal group therapy.  Evaluation of Outcomes: Met   Progress in Treatment: Attending groups: No Participating in groups: No Taking medication as prescribed: Yes Toleration medication: Yes, no side effects reported at this time Family/Significant other contact made: Yes Patient understands diagnosis: No  Limited insight Discussing patient identified problems/goals with staff: Yes Medical problems stabilized or resolved: Yes Denies suicidal/homicidal ideation: Yes Issues/concerns per patient self-inventory: None Other: N/A  New problem(s) identified: None identified at this time.   New Short Term/Long Term Goal(s): None identified at this time.   Discharge Plan or Barriers:  Return home, follow up outpt  Reason for Continuation of Hospitalization: Mania  Medication stabilization   Estimated Length of Stay: 3-5 days  Attendees: Patient: 11/05/2016  8:43 AM  Physician: Ursula Alert, MD 11/05/2016  8:43  AM  Nursing: Hoy Register, RN 11/05/2016  8:43 AM  RN Care Manager: Lars Pinks, RN 11/05/2016  8:43 AM  Social Worker: Ripley Fraise 11/05/2016  8:43 AM  Recreational Therapist: Laretta Bolster  11/05/2016  8:43 AM  Other: Norberto Sorenson 11/05/2016  8:43 AM  Other:  11/05/2016  8:43 AM    Scribe for Treatment Team:  Roque Lias 11/05/2016 8:43 AM

## 2016-11-05 NOTE — [facility] Group Notes (Signed)
[facility] Group Notes:  (Counselor/Nursing/MHT/Case Management/Adjunct)  11/05/2016 1:15PM  Type of Therapy:  Group Therapy  Participation Level:  Active  Participation Quality:  Appropriate  Affect:  Flat  Cognitive:  Oriented  Insight:  Improving  Engagement in Group:  Limited  Engagement in Therapy:  Limited  Modes of Intervention:  Discussion, Exploration and Socialization  Summary of Progress/Problems: The topic for group was balance in life.  Pt participated in the discussion about when their life was in balance and out of balance and how this feels.  Pt discussed ways to get back in balance and short term goals they can work on to get where they want to be. Was present initially, but left and did not come back until the last 5 minutes of group.  Shared in a rambling, tangential manner about importance of confiding in others but not leaving oneself too vulnerable, and how her faith is something that gives her hope and optimism.   [name], Cecely Rengel B 11/05/2016 4:11 PM

## 2016-11-05 NOTE — Progress Notes (Signed)
DAR NOTE: Patient presents with anxious affect and labile at times.  Denies pain, auditory and visual hallucinations.  Described energy level as normal and concentration as poor.  Rates depression at 0, hopelessness at 3, and anxiety at 5.  Maintained on routine safety checks.  Medications given as prescribed.  Support and encouragement offered as needed.  Attended group and participated.  States goal for today is "to have a good day and getting ready to leave."  Patient observed socializing with peers in the dayroom.  Offered no complaint.

## 2016-11-06 MED ORDER — TRAZODONE HCL 50 MG PO TABS
50.0000 mg | ORAL_TABLET | Freq: Every day | ORAL | Status: DC
  Administered 2016-11-06 – 2016-11-08 (×3): 50 mg via ORAL
  Filled 2016-11-06 (×5): qty 1

## 2016-11-06 MED ORDER — POLYETHYLENE GLYCOL 3350 17 G PO PACK
17.0000 g | PACK | Freq: Every day | ORAL | Status: DC
  Administered 2016-11-06 – 2016-11-09 (×4): 17 g via ORAL
  Filled 2016-11-06 (×6): qty 1

## 2016-11-06 MED ORDER — LIDOCAINE 5 % EX PTCH
1.0000 | MEDICATED_PATCH | CUTANEOUS | Status: DC
  Administered 2016-11-06 – 2016-11-13 (×6): 1 via TRANSDERMAL
  Filled 2016-11-06 (×12): qty 1

## 2016-11-06 MED ORDER — QUETIAPINE FUMARATE 200 MG PO TABS
200.0000 mg | ORAL_TABLET | Freq: Every day | ORAL | Status: DC
  Administered 2016-11-06 – 2016-11-08 (×3): 200 mg via ORAL
  Filled 2016-11-06 (×4): qty 1

## 2016-11-06 MED ORDER — ZIPRASIDONE HCL 40 MG PO CAPS
40.0000 mg | ORAL_CAPSULE | Freq: Two times a day (BID) | ORAL | Status: DC
  Administered 2016-11-06 – 2016-11-09 (×7): 40 mg via ORAL
  Filled 2016-11-06 (×10): qty 1

## 2016-11-06 MED ORDER — TRAZODONE HCL 100 MG PO TABS
100.0000 mg | ORAL_TABLET | Freq: Once | ORAL | Status: AC
  Administered 2016-11-06: 100 mg via ORAL
  Filled 2016-11-06: qty 1

## 2016-11-06 NOTE — Progress Notes (Signed)
[facility]  11/06/2016 2:03 PM Kathleene HazelKaren Hart Collins  MRN:  [id number] Subjective:  Pt states " I feel anxious , I feel so dirty inside since I have not pooped . 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  Patient seen and chart reviewed.Discussed patient with treatment team.  Pt continues to be pressured , labile , restless, anxious . Pt seen with tangential , loose thought process today. Pt is compliant on medications , reports feeling tired . Pt also with constipation. Offered symptomatic treatment. Discussed that her seroquel can be tapered off and Geodon can be started to help with her mood sx. Pt agrees. Continue to support.     Principal Problem: Bipolar disorder, current episode manic, severe with psychotic features (H[facility]) Diagnosis:   Patient Active Problem List   Diagnosis Date Noted  . Bipolar disorder, current episode manic, severe with psychotic features ([facility]) [F31.2] 11/03/2016  . Obsessive-compulsive disorder [F42.9]    Total Time spent with patient: 25 minutes  Past Psychiatric History: Please see H&P.   Past Medical History: hx of thyroid nodule and S/P surgery while in elementary school, denies any issues now. Family History:  Family History  Problem Relation Age of Onset  . Bipolar disorder Mother   . Hypertension Father    Family Psychiatric  History: mother has Bipolar do , did well on Li and stelazine for 15 yrs , had to stop it when her renal function became abnormal. Social History: Please see H&P.  History  Alcohol Use  . Yes    Comment: occasional drinker, less than 2 drinks per week     History  Drug use: Unknown    Social History   Social History  . Marital status: Single    Spouse name: N/A  . Number of children: N/A  . Years of education: N/A   Social History Main Topics  .  Smoking status: Never Smoker  . Smokeless tobacco: Never Used  . Alcohol use Yes     Comment: occasional drinker, less than 2 drinks per week  . Drug use: Unknown  . Sexual activity: Not Asked   Other Topics Concern  . None   Social History Narrative  . None   Additional Social History:                         Sleep: Fair  Appetite:  Fair  Current Medications: Current Facility-Administered Medications  Medication Dose Route Frequency Provider Last Rate Last Dose  . alum & mag hydroxide-simeth (MAALOX/MYLANTA) 200-200-20 MG/5ML suspension 30 mL  30 mL Oral Q4H PRN Beau FannyJohn C Withrow, FNP      . hydrOXYzine (ATARAX/VISTARIL) tablet 25 mg  25 mg Oral Q6H PRN Jomarie LongsSaramma Ettel Albergo, MD   25 mg at 11/06/16 [id number]  . ibuprofen (ADVIL,MOTRIN) tablet 600 mg  600 mg Oral Q8H PRN Beau FannyJohn C Withrow, FNP      . Influenza vac split quadrivalent PF (FLUARIX) injection 0.5 mL  0.5 mL Intramuscular Tomorrow-1000 Oneta Rackanika N Lewis, NP      . lidocaine (LIDODERM) 5 % 1 patch  1 patch Transdermal Q24H Jomarie LongsSaramma Deyna Carbon, MD   1 patch at 11/06/16 1125  . lithium carbonate capsule 300 mg  300 mg Oral BID WC Shaman Muscarella  Riverlyn Kizziah, MD   300 mg at 11/06/16 0728  . LORazepam (ATIVAN) tablet 1 mg  1 mg Oral Q6H PRN Adonis BrookSheila Agustin, NP   1 mg at 11/06/16 0209   Or  . LORazepam (ATIVAN) injection 1 mg  1 mg Intramuscular Q6H PRN Adonis BrookSheila Agustin, NP   1 mg at 11/02/16 1451  . magnesium hydroxide (MILK OF MAGNESIA) suspension 30 mL  30 mL Oral Daily PRN Beau FannyJohn C Withrow, FNP   30 mL at 11/01/16 [id number]  . nicotine (NICODERM CQ - dosed in mg/24 hours) patch 21 mg  21 mg Transdermal Daily Beau FannyJohn C Withrow, FNP   21 mg at 11/06/16 0726  . OLANZapine (ZYPREXA) tablet 10 mg  10 mg Oral TID PRN Jomarie LongsSaramma Scarlet Abad, MD   10 mg at 11/06/16 1004   Or  . OLANZapine (ZYPREXA) injection 10 mg  10 mg Intramuscular TID PRN Jomarie LongsSaramma Filbert Craze, MD      . ondansetron (ZOFRAN) tablet 4 mg  4 mg Oral Q8H PRN Beau FannyJohn C Withrow, FNP   4 mg at 11/03/16 2203  .  polyethylene glycol (MIRALAX / GLYCOLAX) packet 17 g  17 g Oral Daily Ermal Haberer, MD   17 g at 11/06/16 1126  . propranolol (INDERAL) tablet 10 mg  10 mg Oral TID Jomarie LongsSaramma Meleah Demeyer, MD   10 mg at 11/06/16 1126  . QUEtiapine (SEROQUEL) tablet 200 mg  200 mg Oral QHS Ahana Najera, MD      . simethicone (MYLICON) chewable tablet 80 mg  80 mg Oral QID PRN Jomarie LongsSaramma Sueo Cullen, MD      . traZODone (DESYREL) tablet 50 mg  50 mg Oral QHS Deshanda Molitor, MD      . white petrolatum (VASELINE) gel   Topical PRN Beau FannyJohn C Withrow, FNP      . ziprasidone (GEODON) capsule 40 mg  40 mg Oral BID WC Nohemy Koop, MD   40 mg at 11/06/16 1125    Lab Results:  No results found for this or any previous visit (from the past 48 hour(s)).  Blood Alcohol level:  Lab Results  Component Value Date   ETH <5 10/30/2016    Metabolic Disorder Labs: Lab Results  Component Value Date   HGBA1C 4.8 11/02/2016   MPG 91 11/02/2016   Lab Results  Component Value Date   PROLACTIN 26.5 (H) 11/02/2016   Lab Results  Component Value Date   CHOL 108 11/02/2016   TRIG 51 11/02/2016   HDL 35 (L) 11/02/2016   CHOLHDL 3.1 11/02/2016   VLDL 10 11/02/2016   LDLCALC 63 11/02/2016    Physical Findings: AIMS: Facial and Oral Movements Muscles of Facial Expression: None, normal Lips and Perioral Area: None, normal Jaw: None, normal Tongue: None, normal,Extremity Movements Upper (arms, wrists, hands, fingers): None, normal Lower (legs, knees, ankles, toes): None, normal, Trunk Movements Neck, shoulders, hips: None, normal, Overall Severity Severity of abnormal movements (highest score from questions above): None, normal Incapacitation due to abnormal movements: None, normal Patient's awareness of abnormal movements (rate only patient's report): No Awareness, Dental Status Current problems with teeth and/or dentures?: No Does patient usually wear dentures?: No  CIWA:    COWS:     Musculoskeletal: Strength & Muscle  Tone: within normal limits Gait & Station: normal Patient leans: N/A  Psychiatric Specialty Exam: Physical Exam  Nursing note and vitals reviewed.   Review of Systems  Gastrointestinal: Positive for constipation.  Psychiatric/Behavioral: Positive for depression. The patient is nervous/anxious.   All other  systems reviewed and are negative.   Blood pressure 112/78, pulse 92, temperature 98.4 F (36.9 C), temperature source Oral, resp. rate 20, height 5\' 7"  (1.702 m), weight 62.6 kg (138 lb), last menstrual period 10/14/2016.Body mass index is 21.61 kg/m.  General Appearance: Casual  Eye Contact:  Fair  Speech:  Pressured  Volume:  Normal  Mood:  Anxious improving  Affect:  Labile  Thought Process:  Disorganized and Descriptions of Associations: Tangential more redirectable  Orientation:  Full (Time, Place, and Person)  Thought Content:  Paranoid Ideation and Rumination improving  Suicidal Thoughts:  No  Homicidal Thoughts:  No  Memory:  Immediate;   Fair Recent;   Fair Remote;   Fair  Judgement:  Impaired  Insight:  Fair  Psychomotor Activity:  Restlessness  Concentration:  Concentration: Poor and Attention Span: Fair  Recall:  [employer] of Knowledge:  Fair  Language:  Fair  Akathisia:  No  Handed:  Right  AIMS (if indicated):     Assets:  Desire for Improvement  ADL's:  Intact  Cognition:  WNL  Sleep:  Number of Hours: 6   11/05/16 : Mother contacted Clinical research associate - wanted an update about patients progress. Discussed that she is showing some progress on the medications . Also she wanted to know if Clinical research associate received records from [place].    11/04/16 Reviewed records obtained from [place] -  [facility] mental health facility : As per records pt was admitted there from another facility [facility]) on November 17 th , with a manic episode . Pt at that time presented as delusional , with sexual disinhibition , high energy ) was started on olanzapine  titrated up to 30 mg per day , aripiprazol 30 mg as well as Depakine ER 500 mg , Lorazepam 5 mg po tid , etumina qhs as well as received IM Clopixol Acufase 50 mg prior to transfer to [place] with her parents on November 28 th.       Treatment Plan Summary:Patient today continues to be seen as pressured tangential and hyperactive. Will continue treatment.   Bipolar disorder, current episode manic, severe with psychotic features (HCC) unstable  Will continue today 11/06/16 plan as below except where it is noted.  Daily contact with patient to assess and evaluate symptoms and progress in treatment and Medication management  Reviewed past medical records,treatment plan.   For Bipolar do : Will taper off seroquel. Will start Geodon 40 mg po bid with meals. Will continue Li 300 mg po bid with meals.  Li level on 11/08/16  For Insomnia: Serqouel 200 mg po qhs .  For constipation: Miralax daily.   For anxiety/agitation: Will continue PRN medication as per agitation protocol.  Will continue to monitor vitals ,medication compliance and treatment side effects while patient is here.   Will monitor for medical issues as well as call consult as needed.   Reviewed labs lipid panel - wnl, hba1c- wnl, pl - slightly elevated , tsh - wnl.  I have reviewed EKG for qtc - wnl.   CSW will continue working on disposition.   Patient to participate in therapeutic milieu .      Milliana Reddoch, MD 11/06/2016, 2:03 PM

## 2016-11-06 NOTE — Progress Notes (Signed)
Recreation Therapy Notes  Date: 11/06/16 Time: 1000 Location: 500 Hall Dayroom  Group Topic: Wellness  Goal Area(s) Addresses:  Patient will define components of whole wellness. Patient will verbalize benefit of whole wellness.  Behavioral Response: Engaged  Intervention: [employer]Beach ball, chairs  Activity: [employer] It ContractorGoing Volleyball.  Patients were to pass the beach ball to each other within the circle to beat the record of 650 hits.  LRT would count each hit on the ball.  The ball could bounce off the ground but could not roll to stop.  If the ball came to a stop, the count would start over.  Education: Wellness, Building control surveyorDischarge Planning.   Education Outcome: Acknowledges education/In group clarification offered/Needs additional education.   Clinical Observations/Feedback: Pt arrived late.  Pt was bright and smiling.  Pt was social with the group.  Pt did become preoccupied with her glasses at one point but was able to refocus.     Caroll RancherMarjette Kiaan Overholser, LRT/CTRS         Caroll RancherLindsay, Blossie Raffel A 11/06/2016 12:02 PM

## 2016-11-06 NOTE — Progress Notes (Signed)
Nursing Note 11/06/2016 [id number]  Data Reports sleeping fair without PRN sleep med.  Rates depression 3/10, hopelessness 7/10, and anxiety 7/10. Affect and mood labile.  Denies HI, SI, AVH.  Patient back and forth between day area and room, was getting naked and spending a lot of time in her bathroom, needed to be redirected many times- was locked out of room.  Speech inappropriate and hypersexual, talking about how horny she is with staff.  Redirectable though.  Acting very bizarre throughout morning, had a few crying fits, and also at one point came out of her room with a towel wrapped in her hair and a shirt around her face (redirectable).  Action Spoke with patient 1:1, nurse offered support to patient throughout shift.  Given PRN Zyprexa in AM.  MD aware of behavior, medications adjusted, first dose Geodon given today.  Redirected multiple times.  Continues to be monitored on 15 minute checks for safety.  Response Behavior may have slightly improved in evening, but it is difficult to tell as she is labile.  Remains safe, but need a lot of redirection to remain appropriate on unit.

## 2016-11-06 NOTE — [facility] Group Notes (Signed)
[facility] LCSW Group Therapy  11/06/2016  1:05 PM  Type of Therapy:  Group therapy  Participation Level:  Active  Participation Quality:  Attentive  Affect:  Flat  Cognitive:  Oriented  Insight:  Limited  Engagement in Therapy:  Limited  Modes of Intervention:  Discussion, Socialization  Summary of Progress/Problems:  Chaplain was here to lead a group on themes of hope and courage. "Caring for others is a really high calling.  I believe it is important.  But you also have to care for self as well.  It's a kind of triangle thing. But ultimately you have to turn it over to God if you are confused or are not sure where the balance is."  Pressured speech.  Still somewhat difficult to follow.  In and out of group several times per usual.  Tracy Russo, Tracy Russo B 11/06/2016 12:48 PM

## 2016-11-06 NOTE — Progress Notes (Signed)
D: Pt denies SI/HI/AVH. Pt is labile, walking behind the nurses station typing on the computer, pt taking off her clothes getting on all fours in the bathroom "my butt hole hurts, I can't poop, I need to masturbate". Pt needs constant redirection.  A: Pt was offered support and encouragement. Pt was given scheduled medications. Pt was encourage to attend groups. Q 15 minute checks were done for safety.   R: Pt is taking medication.Pt receptive to treatment and safety maintained on unit.

## 2016-11-06 NOTE — Progress Notes (Signed)
Did not attend group 

## 2016-11-06 NOTE — Plan of Care (Signed)
Problem: Safety: Goal: Periods of time without injury will increase Outcome: Progressing Pt safe on the unit at this time   

## 2016-11-07 MED ORDER — PHENYLEPH-SHARK LIV OIL-MO-PET 0.25-3-14-71.9 % RE OINT
1.0000 "application " | TOPICAL_OINTMENT | Freq: Three times a day (TID) | RECTAL | Status: DC
  Administered 2016-11-07 – 2016-11-13 (×8): 1 via RECTAL
  Filled 2016-11-07: qty 28.4

## 2016-11-07 MED ORDER — LORAZEPAM 1 MG PO TABS
2.0000 mg | ORAL_TABLET | Freq: Once | ORAL | Status: AC
  Administered 2016-11-07: 2 mg via ORAL
  Filled 2016-11-07: qty 2

## 2016-11-07 NOTE — Progress Notes (Signed)
Pt continues to need constant redirection due to pt manic behaviors, pt continuously trying to do things to stay awake.

## 2016-11-07 NOTE — Progress Notes (Signed)
Female staff has constantly gone in and out of pt room to redirect her into putting on her clothes .

## 2016-11-07 NOTE — [facility] Group Notes (Signed)
[facility] Group Notes: (Clinical Social Work)   11/07/2016      Type of Therapy:  Group Therapy   Participation Level:  Did Not Attend despite MHT prompting   Akeel Reffner Grossman-Orr, LCSW 11/07/2016, 12:49 PM     

## 2016-11-07 NOTE — Progress Notes (Signed)
Nursing Note 11/07/2016 [id number]  Data Reports sleeping fair without PRN sleep med.  Rates depression 2/10, hopelessness 8/10, and anxiety 9/10. Affect labile, animated in morning, somewhat blunted in afternoon. Denies HI, SI, AVH.  Continues to be tangential and preoccupied.  Discloses delusions about stuff like fictional TV programs being real but then expresses insight that she knows they are untrue.  At one point said she was thinking about "falling off of the edge of the bed and cracking my head," but then said she would never do that.  Flight of ideas.  Discloses desires to masturbate saying that she is a virgin and wants to save herself for her husband.  Hypersexual. Patient decompensated some late in the morning (crying spells, pacing, agitated) and again in the afternoon.  At times speaks with clarity but at other times doesn't make much sense with speech.  C/O rectal pain.  Action Spoke with patient 1:1, nurse offered support to patient throughout shift.  Agitation and lability/decompensation managed with therapeutic conversation and PRN Zyprexa (AM) and ativan (afternoon).  NP aware of patient's behavior and rectal pain (preparation H ordered and administered by other RN working on this hallway). Continues to be labile but did not need as much redirection today.  Continues to be monitored on 15 minute checks for safety.    Response Remains safe but tangential and labile, inappropriate speech and behaviors are redirectable. Patient reported having difficulty with BM this evening stating that she had to "reach in and try to pull it out."  This behavior was discouraged, patient verbalized understanding.  Had towels and dirty wash rags with small amounts of stool on them on bathroom floor.  Cleaned them up herself after prompting then housekeeping cleaned room after patient cleaned.  Continues to need a lot of redirection.  PRN medications given were effective to a degree (decrease in behaviors  afterwards.)

## 2016-11-07 NOTE — [facility] Group Notes (Signed)

## 2016-11-07 NOTE — Progress Notes (Signed)
Pt woke up flight of ideas and speaking incoherent at times. " I don't want to be a dog, I want to be a human, I'm dirty I want to be clean, I need to vomit mentally".

## 2016-11-07 NOTE — Progress Notes (Signed)
[facility]  11/07/2016 1:54 PM Tracy Russo  MRN:  [id number] Subjective:  Pt states "I am afraid to use the bathroom ." -patient reports hemorrhoids that is causing pain.     Objective: Tracy Russo is awake, alert with  c/o rectal pain after bowel movements. Seen resting in bathroom. Patient is tearful and paranoid.  Denies auditory or visual hallucination. Patient appear to be responding to internal stimuli at times with disorganized thoughts through this discussion. Patient is labial with complaints of rectal and vaginal pain from "too much masturbation." .Patient reports she is medication compliant takes she is tolerating medication well. Patient reports she not feeling like herself today.  Reports fair appetite and states she is resting well. Support, encouragement and reassurance was provided.    Principal Problem: Bipolar disorder, current episode manic, severe with psychotic features (HCC) Diagnosis:   Patient Active Problem List   Diagnosis Date Noted  . Bipolar disorder, current episode manic, severe with psychotic features (HCC) [F31.2] 11/03/2016  . Obsessive-compulsive disorder [F42.9]    Total Time spent with patient: 30 minutes  Past Psychiatric History: Please see H&P.   Past Medical History: hx of thyroid nodule and S/P surgery while in elementary school, denies any issues now.  Family History:  Family History  Problem Relation Age of Onset  . Bipolar disorder Mother   . Hypertension Father    Family Psychiatric  History: mother has Bipolar do , did well on Li and stelazine for 15 yrs , had to stop it when her renal function became abnormal. Social History: Please see H&P.  History  Alcohol Use  . Yes    Comment: occasional drinker, less than 2 drinks per week     History  Drug use: Unknown    Social History   Social History  . Marital status: Single    Spouse name: N/A  . Number of children: N/A  . Years of education: N/A    Social History Main Topics  . Smoking status: Never Smoker  . Smokeless tobacco: Never Used  . Alcohol use Yes     Comment: occasional drinker, less than 2 drinks per week  . Drug use: Unknown  . Sexual activity: Not Asked   Other Topics Concern  . None   Social History Narrative  . None   Additional Social History:                         Sleep: Fair  Appetite:  Fair  Current Medications: Current Facility-Administered Medications  Medication Dose Route Frequency Provider Last Rate Last Dose  . alum & mag hydroxide-simeth (MAALOX/MYLANTA) 200-200-20 MG/5ML suspension 30 mL  30 mL Oral Q4H PRN Beau FannyJohn C Withrow, FNP      . hydrOXYzine (ATARAX/VISTARIL) tablet 25 mg  25 mg Oral Q6H PRN Jomarie LongsSaramma Eappen, MD   25 mg at 11/06/16 2012  . ibuprofen (ADVIL,MOTRIN) tablet 600 mg  600 mg Oral Q8H PRN Beau FannyJohn C Withrow, FNP      . Influenza vac split quadrivalent PF (FLUARIX) injection 0.5 mL  0.5 mL Intramuscular Tomorrow-1000 Oneta Rackanika N Lewis, NP      . lidocaine (LIDODERM) 5 % 1 patch  1 patch Transdermal Q24H Jomarie LongsSaramma Eappen, MD   1 patch at 11/06/16 1125  . lithium carbonate capsule 300 mg  300 mg Oral BID WC Jomarie LongsSaramma Eappen, MD   300 mg at 11/07/16 0745  . LORazepam (ATIVAN) tablet 1 mg  1 mg Oral Q6H PRN Adonis Brook, NP   1 mg at 11/06/16 2012   Or  . LORazepam (ATIVAN) injection 1 mg  1 mg Intramuscular Q6H PRN Adonis Brook, NP   1 mg at 11/02/16 1451  . magnesium hydroxide (MILK OF MAGNESIA) suspension 30 mL  30 mL Oral Daily PRN Beau Fanny, FNP   30 mL at 11/06/16 2018  . nicotine (NICODERM CQ - dosed in mg/24 hours) patch 21 mg  21 mg Transdermal Daily Beau Fanny, FNP   21 mg at 11/07/16 0747  . OLANZapine (ZYPREXA) tablet 10 mg  10 mg Oral TID PRN Jomarie Longs, MD   10 mg at 11/07/16 1123   Or  . OLANZapine (ZYPREXA) injection 10 mg  10 mg Intramuscular TID PRN Jomarie Longs, MD      . ondansetron (ZOFRAN) tablet 4 mg  4 mg Oral Q8H PRN Beau Fanny, FNP   4  mg at 11/03/16 2203  . phenylephrine-shark liver oil-mineral oil-petrolatum (PREPARATION H) rectal ointment 1 application  1 application Rectal TID Oneta Rack, NP   1 application at 11/07/16 1224  . polyethylene glycol (MIRALAX / GLYCOLAX) packet 17 g  17 g Oral Daily Jomarie Longs, MD   17 g at 11/07/16 0744  . propranolol (INDERAL) tablet 10 mg  10 mg Oral TID Jomarie Longs, MD   10 mg at 11/07/16 1125  . QUEtiapine (SEROQUEL) tablet 200 mg  200 mg Oral QHS Jomarie Longs, MD   200 mg at 11/06/16 2011  . simethicone (MYLICON) chewable tablet 80 mg  80 mg Oral QID PRN Jomarie Longs, MD      . traZODone (DESYREL) tablet 50 mg  50 mg Oral QHS Jomarie Longs, MD   50 mg at 11/06/16 2012  . white petrolatum (VASELINE) gel   Topical PRN Beau Fanny, FNP      . ziprasidone (GEODON) capsule 40 mg  40 mg Oral BID WC Jomarie Longs, MD   40 mg at 11/07/16 0745    Lab Results:  No results found for this or any previous visit (from the past 48 hour(s)).  Blood Alcohol level:  Lab Results  Component Value Date   ETH <5 10/30/2016    Metabolic Disorder Labs: Lab Results  Component Value Date   HGBA1C 4.8 11/02/2016   MPG 91 11/02/2016   Lab Results  Component Value Date   PROLACTIN 26.5 (H) 11/02/2016   Lab Results  Component Value Date   CHOL 108 11/02/2016   TRIG 51 11/02/2016   HDL 35 (L) 11/02/2016   CHOLHDL 3.1 11/02/2016   VLDL 10 11/02/2016   LDLCALC 63 11/02/2016    Physical Findings: AIMS: Facial and Oral Movements Muscles of Facial Expression: None, normal Lips and Perioral Area: None, normal Jaw: None, normal Tongue: None, normal,Extremity Movements Upper (arms, wrists, hands, fingers): None, normal Lower (legs, knees, ankles, toes): None, normal, Trunk Movements Neck, shoulders, hips: None, normal, Overall Severity Severity of abnormal movements (highest score from questions above): None, normal Incapacitation due to abnormal movements: None,  normal Patient's awareness of abnormal movements (rate only patient's report): No Awareness, Dental Status Current problems with teeth and/or dentures?: No Does patient usually wear dentures?: No  CIWA:    COWS:     Musculoskeletal: Strength & Muscle Tone: within normal limits Gait & Station: normal Patient leans: N/A  Psychiatric Specialty Exam: Physical Exam  Nursing note and vitals reviewed. Neurological: She is alert.  Psychiatric:  She has a normal mood and affect. Her behavior is normal.    Review of Systems  Gastrointestinal:       Patient has c/o rectal pain after bowel movements  Psychiatric/Behavioral: The patient is nervous/anxious.   All other systems reviewed and are negative.   Blood pressure 119/63, pulse (!) 57, temperature 98.4 F (36.9 C), temperature source Oral, resp. rate 20, height 5\' 7"  (1.702 m), weight 62.6 kg (138 lb), last menstrual period 10/14/2016.Body mass index is 21.61 kg/m.  General Appearance: Casual  Eye Contact:  Fair  Speech:  Pressured  Volume:  Normal  Mood:  Anxious   Affect:  Labile  Thought Process:  Disorganized and Descriptions of Associations: Tangential more redirectable  Orientation:  Full (Time, Place, and Person)  Thought Content:  Paranoid Ideation and Rumination   Suicidal Thoughts:  No  Homicidal Thoughts:  No  Memory:  Immediate;   Fair Recent;   Fair Remote;   Fair  Judgement:  Impaired  Insight:  Fair  Psychomotor Activity:  Restlessness  Concentration:  Concentration: Poor and Attention Span: Fair  Recall:  FiservFair  Fund of Knowledge:  Fair  Language:  Fair  Akathisia:  No  Handed:  Right  AIMS (if indicated):     Assets:  Desire for Improvement Resilience Social Support  ADL's:  Intact  Cognition:  WNL  Sleep:  Number of Hours: 3.5    I agree with current treatment plan on 11/07/2016, Patient seen face-to-face for psychiatric evaluation follow-up, chart reviewed. Reviewed the information documented and  agree with the treatment plan.  11/05/16 : Mother contacted Clinical research associatewriter - wanted an update about patients progress. Discussed that she is showing some progress on the medications . Also she wanted to know if Clinical research associatewriter received records from [place].  11/04/16 Reviewed records obtained from [place] [facility]  [facility] mental health facility : As per records pt was admitted there from another facility [facility]( [facility]) on November 17 th , with a manic episode . Pt at that time presented as delusional , with sexual disinhibition , high energy ) was started on olanzapine titrated up to 30 mg per day , aripiprazol 30 mg as well as Depakine ER 500 mg , Lorazepam 5 mg po tid , etumina qhs as well as received IM Clopixol Acufase 50 mg prior to transfer to [place] with her parents on November 28 th.    Treatment Plan Summary:  Bipolar disorder, current episode manic, severe with psychotic features (HCC) unstable  Will continue today 11/07/16 plan as below except where it is noted.  Daily contact with patient to assess and evaluate symptoms and progress in treatment and Medication management  Reviewed past medical records,treatment plan.   For Bipolar do : Will continue Seroquel , reduce to 25 mg po bid and 400 mg po qhs. Dose reduced due to tachycardia and tiredness. Will continue Li 300 mg po bid with meals.  Li level on 11/08/16 - 11/06/2016 Started on Geodon 40 mg for mood stabilization, will continue For Insomnia: Serqouel 400 mg po qhs   For anxiety/agitation: Will continue PRN medication as per agitation protocol. Will continue to monitor vitals ,medication compliance and treatment side effects while patient is here.  Will monitor for medical issues as well as call consult as needed.  Reviewed labs lipid panel - wnl, hba1c- wnl, pl - slightly elevated , tsh - wnl. I have reviewed EKG for qtc - wnl. CSW will continue working on disposition.  Patient to participate in therapeutic milieu .     Oneta Rack, NP 11/07/2016, 1:54 PM  Agree with notes and plan

## 2016-11-07 NOTE — Progress Notes (Signed)
Pt continues  To be up taking her clothes off, getting on all fours in the bathroom.

## 2016-11-07 NOTE — Plan of Care (Signed)
Problem: Self-Care: Goal: Ability to participate in self-care as condition permits will improve Outcome: Progressing Patient able to complete ADLs with direction from staff. Patient able to verbalize needs to staff. Patient has no restrictions to complete ADLs.  Problem: Safety: Goal: Periods of time without injury will increase Outcome: Progressing Patient remains safe on the unit at this time. Patient is on q15 min safety checks. VSS.

## 2016-11-07 NOTE — Progress Notes (Signed)
[facility] Group Notes:  (Nursing/MHT/Case Management/Adjunct)  Date:  11/07/2016  Time:  8:58 PM  Type of Therapy:  Psychoeducational Skills  Participation Level:  Active  Participation Quality:  Appropriate  Affect:  Depressed  Cognitive:  Appropriate  Insight:  Improving  Engagement in Group:  Developing/Improving  Modes of Intervention:  Education  Summary of Progress/Problems: The patient shared with the group that she had a bad day overall since she felt "emotional". She also stated that she felt that she was dealing with the "side effects" from her medication. The patient did not go into further detail. As for the theme of the day, her support system will be comprised of her father,brother, and church members.   Hazle CocaGOODMAN, Quanda Pavlicek S 11/07/2016, 8:58 PM

## 2016-11-07 NOTE — Progress Notes (Addendum)
Nursing Progress Note 7p-7a  D) Patient presents anxious and assertive. Patient anxiety has decreased since beginning of shift. Patient reports feeling "much better" but states she has had "an emotional day". Patient reports she is "no longer afraid of the shower" but that she is "afraid to use the bathroom and have someone hear or smell it". Patient reports her anxiety "makes me hold back my bowels sometimes". Patient reports she has had a bowel movement today. During group patient had an episode of urinary incontinence. Fresh scrubs provided to patient. Patient needs encouragement to complete ADLs independently. When directed, patient has no restrictions to complete ADLs. Patient denies SI/HI/AVH or pain. Patient contracts for safety at this time.   A) Emotional support given. Patient medicated as prescribed. Patient on q15 min safety checks. Opportunities for questions or concerns presented to patient. Patient encouraged to continue to work on coping skills for anxiety.  R) Patient receptive to interaction with nurse. Patient remains safe on the unit at this time. Patient is resting in bed without complaints. Will continue to monitor.

## 2016-11-08 LAB — LITHIUM LEVEL: Lithium Lvl: 0.19 mmol/L — ABNORMAL LOW (ref 0.60–1.20)

## 2016-11-08 NOTE — Progress Notes (Addendum)
Patient is awake and heard flushing toilet several times. Patient asking staff and other patients to "walk with her". Patient directed back to room. At 0445, patient reports her sink will not drain. On inspection, staff cannot unclog sink. Will request maintenance service in AM. Patient reports feeling "antsy, restless, and anxious". PRN ativan given as prescribed. Patient anxious about "becoming dependent on medications". Emotional support given. Patient with pressured, rapid speech says "you have been very caring for me tonight, thank you. Tell Jesusita OkaDan he has been caring too. I said this hospital was not caring earlier, but I was just frustrated and mad". Patient says "thank you for your patience". Will continue to monitor.

## 2016-11-08 NOTE — Progress Notes (Signed)
After having been awake for nearly an hour, the patient has returned her bed where she is now laying down. Previously, she registered multiple requests with the staff, ie. A pen for writing, help with unclogging her bathroom sink, and needing additional supplies for her room. She was redirected for dancing at the end of the hallway and for socializing with a female peer. She was encouraged several times to return to bed before she complied. In addition, the patient made several trips to the restroom.

## 2016-11-08 NOTE — Progress Notes (Signed)
D: Pt denies SI/HI/AVH. Pt presents with pressured speech, manic behaviors, and crying spells. Pt very labile going from normal behavior, to manic, to crying .  A: Pt was offered support and encouragement. Pt was given scheduled medications. Pt was encourage to attend groups. Q 15 minute checks were done for safety.   R:Pt attends groups and interacts  with peers and staff. Pt is taking medication.Pt receptive to treatment and safety maintained on unit.

## 2016-11-08 NOTE — Progress Notes (Signed)
[facility] Group Notes:  (Nursing/MHT/Case Management/Adjunct)  Date:  11/08/2016  Time:  9:22 PM  Type of Therapy:  Psychoeducational Skills  Participation Level:  Active  Participation Quality:  Appropriate  Affect:  Appropriate  Cognitive:  Appropriate  Insight:  Good  Engagement in Group:  Engaged and Improving  Modes of Intervention:  Education  Summary of Progress/Problems: The patient described her day as having been "hyper". She stated that she had a lot of "personal problems" and that she was grateful since the nursing staff encouraged her to do more for herself. She also stated that the staff offered her "tough love" and that she really needed to hear those suggestions. The patient had a good visit with her parents this evening. In terms of the theme for the day, her coping skill will be to "accept boundaries" and to check her "processing skills". She stated that she doesn't always read a person's body language.   Tracy Russo S 11/08/2016, 9:22 PM

## 2016-11-08 NOTE — Progress Notes (Signed)
[facility] MD Progress Note  11/08/2016 8:46 AM Tracy Russo  MRN:  [id number] Subjective:  Pt states "I am feeling so much better today, I am still having some anxiety regarding my rectal area." -patient reports hemorrhoids that is causing pain.  Objective: Chistine Dematteo is awake, alert continues to endose rectal pain after bowel movements. Seen resting in bedroom. Patient is pleasant with paranoid thoughts.  Denies auditory or visual hallucination. Patient appear to be responding to internal stimuli at times with disorganized thoughts through this discussion. Patient continues to be labile, less tearful today.  Patient reports she is medication compliant takes she is tolerating medication well. Patient reports she is feeling better today. patient seen interacting with peers in dayroom.Reports fair appetite and states she is resting well. Support, encouragement and reassurance was provided.    Principal Problem: Bipolar disorder, current episode manic, severe with psychotic features (HCC) Diagnosis:   Patient Active Problem List   Diagnosis Date Noted  . Bipolar disorder, current episode manic, severe with psychotic features (HCC) [F31.2] 11/03/2016  . Obsessive-compulsive disorder [F42.9]    Total Time spent with patient: 30 minutes  Past Psychiatric History: Please see H&P.   Past Medical History: hx of thyroid nodule and S/P surgery while in elementary school, denies any issues now.  Family History:  Family History  Problem Relation Age of Onset  . Bipolar disorder Mother   . Hypertension Father    Family Psychiatric  History: mother has Bipolar do , did well on Li and stelazine for 15 yrs , had to stop it when her renal function became abnormal. Social History: Please see H&P.  History  Alcohol Use  . Yes    Comment: occasional drinker, less than 2 drinks per week     History  Drug use: Unknown    Social History   Social History  . Marital status: Single   Spouse name: N/A  . Number of children: N/A  . Years of education: N/A   Social History Main Topics  . Smoking status: Never Smoker  . Smokeless tobacco: Never Used  . Alcohol use Yes     Comment: occasional drinker, less than 2 drinks per week  . Drug use: Unknown  . Sexual activity: Not Asked   Other Topics Concern  . None   Social History Narrative  . None   Additional Social History:                         Sleep: Fair  Appetite:  Fair  Current Medications: Current Facility-Administered Medications  Medication Dose Route Frequency Provider Last Rate Last Dose  . alum & mag hydroxide-simeth (MAALOX/MYLANTA) 200-200-20 MG/5ML suspension 30 mL  30 mL Oral Q4H PRN Beau Fanny, FNP      . hydrOXYzine (ATARAX/VISTARIL) tablet 25 mg  25 mg Oral Q6H PRN Jomarie Longs, MD   25 mg at 11/08/16 0358  . ibuprofen (ADVIL,MOTRIN) tablet 600 mg  600 mg Oral Q8H PRN Beau Fanny, FNP      . Influenza vac split quadrivalent PF (FLUARIX) injection 0.5 mL  0.5 mL Intramuscular Tomorrow-1000 Oneta Rack, NP      . lidocaine (LIDODERM) 5 % 1 patch  1 patch Transdermal Q24H Jomarie Longs, MD   1 patch at 11/06/16 1125  . lithium carbonate capsule 300 mg  300 mg Oral BID WC Jomarie Longs, MD   300 mg at 11/08/16 0817  . LORazepam (ATIVAN)  tablet 1 mg  1 mg Oral Q6H PRN Adonis BrookSheila Agustin, NP   1 mg at 11/08/16 0452   Or  . LORazepam (ATIVAN) injection 1 mg  1 mg Intramuscular Q6H PRN Adonis BrookSheila Agustin, NP   1 mg at 11/02/16 1451  . magnesium hydroxide (MILK OF MAGNESIA) suspension 30 mL  30 mL Oral Daily PRN Beau FannyJohn C Withrow, FNP   30 mL at 11/06/16 2018  . nicotine (NICODERM CQ - dosed in mg/24 hours) patch 21 mg  21 mg Transdermal Daily Beau FannyJohn C Withrow, FNP   21 mg at 11/07/16 0747  . OLANZapine (ZYPREXA) tablet 10 mg  10 mg Oral TID PRN Jomarie LongsSaramma Eappen, MD   10 mg at 11/07/16 1905   Or  . OLANZapine (ZYPREXA) injection 10 mg  10 mg Intramuscular TID PRN Jomarie LongsSaramma Eappen, MD      .  ondansetron (ZOFRAN) tablet 4 mg  4 mg Oral Q8H PRN Beau FannyJohn C Withrow, FNP   4 mg at 11/03/16 2203  . phenylephrine-shark liver oil-mineral oil-petrolatum (PREPARATION H) rectal ointment 1 application  1 application Rectal TID Oneta Rackanika N Lewis, NP   1 application at 11/07/16 1224  . polyethylene glycol (MIRALAX / GLYCOLAX) packet 17 g  17 g Oral Daily Jomarie LongsSaramma Eappen, MD   17 g at 11/08/16 0818  . propranolol (INDERAL) tablet 10 mg  10 mg Oral TID Jomarie LongsSaramma Eappen, MD   10 mg at 11/08/16 0817  . QUEtiapine (SEROQUEL) tablet 200 mg  200 mg Oral QHS Jomarie LongsSaramma Eappen, MD   200 mg at 11/07/16 2146  . simethicone (MYLICON) chewable tablet 80 mg  80 mg Oral QID PRN Jomarie LongsSaramma Eappen, MD      . traZODone (DESYREL) tablet 50 mg  50 mg Oral QHS Jomarie LongsSaramma Eappen, MD   50 mg at 11/07/16 2146  . white petrolatum (VASELINE) gel   Topical PRN Beau FannyJohn C Withrow, FNP      . ziprasidone (GEODON) capsule 40 mg  40 mg Oral BID WC Jomarie LongsSaramma Eappen, MD   40 mg at 11/08/16 [id number]    Lab Results:  No results found for this or any previous visit (from the past 48 hour(s)).  Blood Alcohol level:  Lab Results  Component Value Date   ETH <5 10/30/2016    Metabolic Disorder Labs: Lab Results  Component Value Date   HGBA1C 4.8 11/02/2016   MPG 91 11/02/2016   Lab Results  Component Value Date   PROLACTIN 26.5 (H) 11/02/2016   Lab Results  Component Value Date   CHOL 108 11/02/2016   TRIG 51 11/02/2016   HDL 35 (L) 11/02/2016   CHOLHDL 3.1 11/02/2016   VLDL 10 11/02/2016   LDLCALC 63 11/02/2016    Physical Findings: AIMS: Facial and Oral Movements Muscles of Facial Expression: None, normal Lips and Perioral Area: None, normal Jaw: None, normal Tongue: None, normal,Extremity Movements Upper (arms, wrists, hands, fingers): None, normal Lower (legs, knees, ankles, toes): None, normal, Trunk Movements Neck, shoulders, hips: None, normal, Overall Severity Severity of abnormal movements (highest score from questions above):  None, normal Incapacitation due to abnormal movements: None, normal Patient's awareness of abnormal movements (rate only patient's report): No Awareness, Dental Status Current problems with teeth and/or dentures?: No Does patient usually wear dentures?: No  CIWA:    COWS:     Musculoskeletal: Strength & Muscle Tone: within normal limits Gait & Station: normal Patient leans: N/A  Psychiatric Specialty Exam: Physical Exam  Nursing note and vitals reviewed. Neurological: She  is alert.  Psychiatric: She has a normal mood and affect. Her behavior is normal.    Review of Systems  Gastrointestinal:       Patient has c/o rectal pain after bowel movements  Psychiatric/Behavioral: The patient is nervous/anxious.   All other systems reviewed and are negative.   Blood pressure 113/85, pulse (!) 103, temperature 97.4 F (36.3 C), temperature source Oral, resp. rate 15, height 5\' 7"  (1.702 m), weight 62.6 kg (138 lb), last menstrual period 10/14/2016.Body mass index is 21.61 kg/m.  General Appearance: Casual  Eye Contact:  Fair  Speech:  Pressured  Volume:  Normal  Mood:  Anxious   Affect:  Labile  Thought Process:  Disorganized and Descriptions of Associations: Tangential more redirectable  Orientation:  Full (Time, Place, and Person)  Thought Content:  Paranoid Ideation and Rumination   Suicidal Thoughts:  No  Homicidal Thoughts:  No  Memory:  Immediate;   Fair Recent;   Fair Remote;   Fair  Judgement:  Impaired  Insight:  Fair  Psychomotor Activity:  Restlessness  Concentration:  Concentration: Poor and Attention Span: Fair  Recall:  [employer] of Knowledge:  Fair  Language:  Fair  Akathisia:  No  Handed:  Right  AIMS (if indicated):     Assets:  Desire for Improvement Resilience Social Support  ADL's:  Intact  Cognition:  WNL  Sleep:  Number of Hours: 5.25    I agree with current treatment plan on 11/07/2016, Patient seen face-to-face for psychiatric evaluation  follow-up, chart reviewed. Reviewed the information documented and agree with the treatment plan.  11/05/16 : Mother contacted Clinical research associate - wanted an update about patients progress. Discussed that she is showing some progress on the medications . Also she wanted to know if Clinical research associate received records from [place].  11/04/16 Reviewed records obtained from [place] -  [facility] - mental health facility : As per records pt was admitted there from another facility [facility]) on November 17 th , with a manic episode . Pt at that time presented as delusional , with sexual disinhibition , high energy ) was started on olanzapine titrated up to 30 mg per day , aripiprazol 30 mg as well as Depakine ER 500 mg , Lorazepam 5 mg po tid , etumina qhs as well as received IM Clopixol Acufase 50 mg prior to transfer to [place] with her parents on November 28 th.    Treatment Plan Summary:  Bipolar disorder, current episode manic, severe with psychotic features (HCC) unstable  Will continue today 11/08/16 plan as below except where it is noted.  Daily contact with patient to assess and evaluate symptoms and progress in treatment and Medication management  Reviewed past medical records,treatment plan.   For Bipolar do : Will continue Seroquel , reduce to 25 mg po bid and 400 mg po qhs. Dose reduced due to tachycardia and tiredness. Will continue Li 300 mg po bid with meals.  Li level on 11/08/16- In process - 11/06/2016 Started on Geodon 40 mg for mood stabilization, will continue For Insomnia: Serqouel 400 mg po qhs   For anxiety/agitation: Will continue PRN medication as per agitation protocol. Will continue to monitor vitals ,medication compliance and treatment side effects while patient is here.  Will monitor for medical issues as well as call consult as needed.  Reviewed labs lipid panel - wnl, hba1c- wnl, pl - slightly elevated , tsh - wnl. I have reviewed EKG for qtc - wnl.  CSW will  continue working on disposition.  Patient to participate in therapeutic milieu .    Oneta Rackanika N Lewis, NP 11/08/2016, 8:46 AM  I agree to the notes and plan.

## 2016-11-08 NOTE — [facility] Group Notes (Signed)
[facility] Group Notes:  (Clinical Social Work)  11/08/2016  11:00AM-12:00PM  Summary of Progress/Problems:  The main focus of today's process group was to listen to a variety of genres of music and to identify that different types of music provoke different responses.  The patient then was able to identify personally what was soothing for them, as well as energizing, as well as how patient can personally use this knowledge in sleep habits, with depression, and with other symptoms.  The patient expressed at the beginning of group the overall feeling of "good about life but antsy."  She stated at the end of group that she still felt antsy, but "full of love" and was noted to have much less psychomotor agitation as the group progressed.  Type of Therapy:  Music Therapy   Participation Level:  Active  Participation Quality:  Attentive   Affect:  Not congruent  Cognitive: Disorganized  Insight:  Engaged  Engagement in Therapy:  Engaged  Modes of Intervention:   Activity, Exploration  Ambrose MantleMareida Grossman-Orr, LCSW 11/08/2016
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
## 2016-11-08 NOTE — Progress Notes (Signed)
Patient awake and pacing the halls. Patient redirected to room and asked to try to rest. Patient reports she feels anxious "4/10". PRN vistaril given as prescribed. Patient directed to rest in room. Will follow up.

## 2016-11-08 NOTE — Plan of Care (Signed)
Problem: Education: Goal: Will be free of psychotic symptoms Outcome: Not Progressing Patient continues to be fixated on her BMs and her "anal area". Somatic paranoia noted throughout the day.

## 2016-11-08 NOTE — Plan of Care (Signed)
Problem: Coping: Goal: Ability to demonstrate self-control will improve Outcome: Progressing Pt continues to need redirection to maintain control, due to pt manic behaviors

## 2016-11-08 NOTE — [facility] Group Notes (Signed)
[facility] Group Notes:  (Nursing/MHT/Case Management/Adjunct)  Date:  11/08/2016  Time:  10:50 AM  Type of Therapy:  Nurse Education  Participation Level:  Active  Participation Quality:  Monopolizing  Affect:  Anxious  Cognitive:  Disorganized  Insight:  Lacking  Engagement in Group:  Monopolizing  Modes of Intervention:  Education  Summary of Progress/Problems: Patient interrupted through out group and was frequently off topic.  Tracy Russo 11/08/2016, 10:50 AM

## 2016-11-09 MED ORDER — DOCUSATE SODIUM 100 MG PO CAPS
100.0000 mg | ORAL_CAPSULE | Freq: Two times a day (BID) | ORAL | Status: DC
  Administered 2016-11-09 – 2016-11-11 (×5): 100 mg via ORAL
  Filled 2016-11-09 (×8): qty 1

## 2016-11-09 MED ORDER — LITHIUM CARBONATE 300 MG PO CAPS
300.0000 mg | ORAL_CAPSULE | Freq: Three times a day (TID) | ORAL | Status: DC
Start: 2016-11-09 — End: 2016-11-13
  Administered 2016-11-09 – 2016-11-13 (×12): 300 mg via ORAL
  Filled 2016-11-09 (×15): qty 1

## 2016-11-09 MED ORDER — GABAPENTIN 100 MG PO CAPS
200.0000 mg | ORAL_CAPSULE | Freq: Three times a day (TID) | ORAL | Status: DC
  Administered 2016-11-09 – 2016-11-10 (×4): 200 mg via ORAL
  Filled 2016-11-09 (×10): qty 2

## 2016-11-09 MED ORDER — QUETIAPINE FUMARATE 100 MG PO TABS
100.0000 mg | ORAL_TABLET | Freq: Every day | ORAL | Status: DC
  Administered 2016-11-10 – 2016-11-11 (×2): 100 mg via ORAL
  Filled 2016-11-09 (×5): qty 1

## 2016-11-09 MED ORDER — ZIPRASIDONE HCL 80 MG PO CAPS
80.0000 mg | ORAL_CAPSULE | Freq: Two times a day (BID) | ORAL | Status: DC
  Administered 2016-11-09 – 2016-11-11 (×4): 80 mg via ORAL
  Filled 2016-11-09 (×6): qty 1

## 2016-11-09 MED ORDER — TRAZODONE HCL 100 MG PO TABS
100.0000 mg | ORAL_TABLET | Freq: Every day | ORAL | Status: DC
  Administered 2016-11-10 – 2016-11-11 (×2): 100 mg via ORAL
  Filled 2016-11-09 (×5): qty 1

## 2016-11-09 NOTE — Progress Notes (Addendum)
[facility] MD Progress Note  11/09/2016 1:45 PM Tracy Russo  MRN:  [id number] Subjective:  Pt states " I feel kind of hyper and anxious , I still feel like I am not pooping well. I need to go home , I do not want to stay here longer."       Objective: Tracy Russo is a 56 y old CF , who is single , who was recently diagnosed with Bipolar do , was doing a teaching job in [place] , was brought in for behavioral changes to [facility].  Patient seen and chart reviewed.Discussed patient with treatment team.  Pt today is seen as pressured and hyperactive. Pt is anxious , disorganized and tangential. Pt currently on geodon , started two days ago , will need to uptitrate the dose since she continues to be manic. Per staff - pt is currently locked out of her room for specific periods of time during the day since pt is preoccupied with her bottom - continues to put her fingers in her private area , continues to talk about masturbating and feeling hypersexual. Continue to need encouragement and support.  Spoke to mother who wanted an update - she reports she wants to take her daughter home soon  if she makes some progress.   Principal Problem: Bipolar disorder, current episode manic, severe with psychotic features (HCC) Diagnosis:   Patient Active Problem List   Diagnosis Date Noted  . Bipolar disorder, current episode manic, severe with psychotic features (HCC) [F31.2] 11/03/2016  . Obsessive-compulsive disorder [F42.9]    Total Time spent with patient: 25 minutes  Past Psychiatric History: Please see H&P.   Past Medical History: hx of thyroid nodule and S/P surgery while in elementary school, denies any issues now. Family History:  Family History  Problem Relation Age of Onset  . Bipolar disorder Mother   . Hypertension Father    Family Psychiatric  History: mother has Bipolar do , did well on Li and stelazine for 15 yrs , had to stop it when her renal function became abnormal. Social History:  Please see H&P.  History  Alcohol Use  . Yes    Comment: occasional drinker, less than 2 drinks per week     History  Drug use: Unknown    Social History   Social History  . Marital status: Single    Spouse name: N/A  . Number of children: N/A  . Years of education: N/A   Social History Main Topics  . Smoking status: Never Smoker  . Smokeless tobacco: Never Used  . Alcohol use Yes     Comment: occasional drinker, less than 2 drinks per week  . Drug use: Unknown  . Sexual activity: Not Asked   Other Topics Concern  . None   Social History Narrative  . None   Additional Social History:                         Sleep: Fair  Appetite:  Fair  Current Medications: Current Facility-Administered Medications  Medication Dose Route Frequency Provider Last Rate Last Dose  . alum & mag hydroxide-simeth (MAALOX/MYLANTA) 200-200-20 MG/5ML suspension 30 mL  30 mL Oral Q4H PRN Beau Fanny, FNP      . docusate sodium (COLACE) capsule 100 mg  100 mg Oral BID Jomarie Longs, MD   100 mg at 11/09/16 1055  . gabapentin (NEURONTIN) capsule 200 mg  200 mg Oral TID Jomarie Longs, MD  200 mg at 11/09/16 1252  . hydrOXYzine (ATARAX/VISTARIL) tablet 25 mg  25 mg Oral Q6H PRN Jomarie LongsSaramma Prarthana Parlin, MD   25 mg at 11/09/16 1055  . ibuprofen (ADVIL,MOTRIN) tablet 600 mg  600 mg Oral Q8H PRN Beau FannyJohn C Withrow, FNP      . Influenza vac split quadrivalent PF (FLUARIX) injection 0.5 mL  0.5 mL Intramuscular Tomorrow-1000 Oneta Rackanika N Lewis, NP      . lidocaine (LIDODERM) 5 % 1 patch  1 patch Transdermal Q24H Jomarie LongsSaramma Tallyn Holroyd, MD   1 patch at 11/09/16 1056  . lithium carbonate capsule 300 mg  300 mg Oral TID WC Jomarie LongsSaramma Jamarkis Branam, MD   300 mg at 11/09/16 1252  . LORazepam (ATIVAN) tablet 1 mg  1 mg Oral Q6H PRN Adonis BrookSheila Agustin, NP   1 mg at 11/08/16 0452   Or  . LORazepam (ATIVAN) injection 1 mg  1 mg Intramuscular Q6H PRN Adonis BrookSheila Agustin, NP   1 mg at 11/02/16 1451  . magnesium hydroxide (MILK OF MAGNESIA)  suspension 30 mL  30 mL Oral Daily PRN Beau FannyJohn C Withrow, FNP   30 mL at 11/06/16 2018  . nicotine (NICODERM CQ - dosed in mg/24 hours) patch 21 mg  21 mg Transdermal Daily Beau FannyJohn C Withrow, FNP   21 mg at 11/09/16 0806  . OLANZapine (ZYPREXA) tablet 10 mg  10 mg Oral TID PRN Jomarie LongsSaramma Zuri Lascala, MD   10 mg at 11/08/16 2046   Or  . OLANZapine (ZYPREXA) injection 10 mg  10 mg Intramuscular TID PRN Jomarie LongsSaramma Katya Rolston, MD      . ondansetron (ZOFRAN) tablet 4 mg  4 mg Oral Q8H PRN Beau FannyJohn C Withrow, FNP   4 mg at 11/03/16 2203  . phenylephrine-shark liver oil-mineral oil-petrolatum (PREPARATION H) rectal ointment 1 application  1 application Rectal TID Oneta Rackanika N Lewis, NP   1 application at 11/09/16 1254  . propranolol (INDERAL) tablet 10 mg  10 mg Oral TID Jomarie LongsSaramma Kymberly Blomberg, MD   10 mg at 11/09/16 1252  . QUEtiapine (SEROQUEL) tablet 100 mg  100 mg Oral QHS Rindi Beechy, MD      . simethicone (MYLICON) chewable tablet 80 mg  80 mg Oral QID PRN Jomarie LongsSaramma Kiesha Ensey, MD      . traZODone (DESYREL) tablet 100 mg  100 mg Oral QHS Khyler Eschmann, MD      . white petrolatum (VASELINE) gel   Topical PRN Beau FannyJohn C Withrow, FNP      . ziprasidone (GEODON) capsule 80 mg  80 mg Oral BID WC Jomarie LongsSaramma Venetta Knee, MD        Lab Results:  Results for orders placed or performed during the hospital encounter of 10/31/16 (from the past 48 hour(s))  Lithium level     Status: Abnormal   Collection Time: 11/08/16  6:31 AM  Result Value Ref Range   Lithium Lvl 0.19 (L) 0.60 - 1.20 mmol/L    Comment: Performed at [facility]    Blood Alcohol level:  Lab Results  Component Value Date   [facility] <5 10/30/2016    Metabolic Disorder Labs: Lab Results  Component Value Date   HGBA1C 4.8 11/02/2016   MPG 91 11/02/2016   Lab Results  Component Value Date   PROLACTIN 26.5 (H) 11/02/2016   Lab Results  Component Value Date   CHOL 108 11/02/2016   TRIG 51 11/02/2016   HDL 35 (L) 11/02/2016   CHOLHDL 3.1 11/02/2016   VLDL 10  11/02/2016   LDLCALC 63  11/02/2016    Physical Findings: AIMS: Facial and Oral Movements Muscles of Facial Expression: None, normal Lips and Perioral Area: None, normal Jaw: None, normal Tongue: None, normal,Extremity Movements Upper (arms, wrists, hands, fingers): None, normal Lower (legs, knees, ankles, toes): None, normal, Trunk Movements Neck, shoulders, hips: None, normal, Overall Severity Severity of abnormal movements (highest score from questions above): None, normal Incapacitation due to abnormal movements: None, normal Patient's awareness of abnormal movements (rate only patient's report): No Awareness, Dental Status Current problems with teeth and/or dentures?: No Does patient usually wear dentures?: No  CIWA:    COWS:     Musculoskeletal: Strength & Muscle Tone: within normal limits Gait & Station: normal Patient leans: N/A  Psychiatric Specialty Exam: Physical Exam  Nursing note and vitals reviewed.   Review of Systems  Psychiatric/Behavioral: Positive for depression. The patient is nervous/anxious.   All other systems reviewed and are negative.   Blood pressure 112/79, pulse 87, temperature 97.8 F (36.6 C), temperature source Oral, resp. rate 16, height 5\' 7"  (1.702 m), weight 62.6 kg (138 lb), last menstrual period 10/14/2016.Body mass index is 21.61 kg/m.  General Appearance: Casual  Eye Contact:  Fair  Speech:  Pressured  Volume:  Increased  Mood:  Anxious and Dysphoric improving  Affect:  Labile and Tearful  Thought Process:  Disorganized and Descriptions of Associations: Tangential needs redirection  Orientation:  Full (Time, Place, and Person)  Thought Content:  Paranoid Ideation and Rumination improving  Suicidal Thoughts:  No  Homicidal Thoughts:  No  Memory:  Immediate;   Fair Recent;   Fair Remote;   Fair  Judgement:  Impaired  Insight:  Fair  Psychomotor Activity:  Restlessness  Concentration:  Concentration: Poor and Attention Span:  Fair  Recall:  FiservFair  Fund of Knowledge:  Fair  Language:  Fair  Akathisia:  No  Handed:  Right  AIMS (if indicated):     Assets:  Desire for Improvement  ADL's:  Intact  Cognition:  WNL  Sleep:  Number of Hours: 6.5   11/09/16: Spoke to mother again - gave update about progress.  11/05/16 : Mother contacted Clinical research associatewriter - wanted an update about patients progress. Discussed that she is showing some progress on the medications . Also she wanted to know if Clinical research associatewriter received records from [place].    11/04/16 Reviewed records obtained from [place]  [facility] : As per records pt was admitted there from another facility [facility]) on November 17 th , with a manic episode . Pt at that time presented as delusional , with sexual disinhibition , high energy ) was started on olanzapine titrated up to 30 mg per day , aripiprazol 30 mg as well as Depakine ER 500 mg , Lorazepam 5 mg po tid , etumina qhs as well as received IM Clopixol Acufase 50 mg prior to transfer to [place] with her parents on November 28 th.       Treatment Plan Summary:Patient today is manic and pressured , preoccupied with her bottom area- needs a lot of redirection on the unit. Will continue treatment.   Bipolar disorder, current episode manic, severe with psychotic features (HCC) unstable  Will continue today 11/09/16 plan as below except where it is noted.  Daily contact with patient to assess and evaluate symptoms and progress in treatment and Medication management  Reviewed past medical records,treatment plan.   For Bipolar do : Will continue to taper off seroquel. Will  increase Geodon to 80 mg po bid with meals. Will increase Li to 300 mg po tid with meals.  Li level on 11/08/16- subtherapeutic - 0.19.  For Insomnia: Reduce to Serqouel 100 mg po qhs . Trazodone 100 mg po qhs .  For anxiety sx: Start Gabapentin 200 mg po tid.  For tachycardia: Propranolol 10 mg po  tid. For constipation: Colace 100 mg po bid.  For anxiety/agitation: Will continue PRN medication as per agitation protocol.  Will continue to monitor vitals ,medication compliance and treatment side effects while patient is here.   Will monitor for medical issues as well as call consult as needed.   Reviewed labs lipid panel - wnl, hba1c- wnl, pl - slightly elevated , tsh - wnl.  I have reviewed EKG for qtc - wnl.   CSW will continue working on disposition.   Patient to participate in therapeutic milieu .      Tracy Banke, MD 11/09/2016, 1:45 PM

## 2016-11-09 NOTE — Progress Notes (Signed)
Recreation Therapy Notes  Date: 11/09/16 Time: 1000 Location: 500 Hall Dayroom  Group Topic: Coping Skills  Goal Area(s) Addresses:  Pt will be able to positive coping skills. Pt will be able to identify the importance of using positive coping skills post d/c.  Behavioral Response:  Engaged, anxious  Intervention: Scientist, clinical (histocompatibility and immunogenetics)Construction paper, markers  Activity: Bridge the [employer].  Patients were to draw to connecting mountains on a Education officer, museumpiece of construction paper.  On the first hump, patients were to identify what brought them to the hospital.  On the second hump, patients were to identify what they hope to accomplish.  Next, patients were to draw a line connecting the two hump.  On that line, patients were to identify the coping skills that would help them reach their goal.   Education: Coping Skills, Discharge Planning.   Education Outcome: Acknowledges understanding/In group clarification offered/Needs additional education.   Clinical Observations/Feedback: Pt was engaged while working on her activity.  Pt seemed anxious.  Pt was unable to sit still and was in and out of group.  Pt was able to focus in small spurts.    Caroll RancherMarjette Kadelyn Dimascio, LRT/CTRS      Caroll RancherLindsay, Usha Slager A 11/09/2016 12:37 PM

## 2016-11-09 NOTE — Progress Notes (Signed)
D: Pt A & O to self and place. Animated, restless / fidgety on approach. Remains disorganized, tangential in her speech. Intrusive and labile in her behavior. Pt observed to be fixated on her buttocks (rectal stimulation, kneeling over asking staff to clean her up). Continues to require multiple verbal redirections to comply with unit routines which has not been effective at times.  A: Scheduled and PRN medications administered as prescribed with verbal education and mouth checks. Emotional support and availability provided to pt. Encouraged pt to voice concern, comply with medication regimen and attend unit groups. Q 15 minutes safety checks maintained without incident.  R: Pt has been medication compliant. Denies adverse drug reactions when assessed. Tolerates meals and fluids well. Attended units groups as scheduled but would not stay in the entire time, as pt was very restless.  POC remains effective for mood stability and safety.

## 2016-11-09 NOTE — [facility] Group Notes (Signed)
[facility] LCSW Group Therapy  11/09/2016 1:15 pm  Type of Therapy: Process Group Therapy  Participation Level:  Active  Participation Quality:  Appropriate  Affect:  Flat  Cognitive:  Oriented  Insight:  Improving  Engagement in Group:  Limited  Engagement in Therapy:  Limited  Modes of Intervention:  Activity, Clarification, Education, Problem-solving and Support  Summary of Progress/Problems: Today's group addressed the issue of overcoming obstacles.  Patients were asked to identify their biggest obstacle post d/c that stands in the way of their on-going success, and then problem solve as to how to manage this.  Was not invited, too symptomatic.  Anxious, unable to sit still and focus.  [name], Tracy Russo 11/09/2016   2:59 PM

## 2016-11-10 MED ORDER — GABAPENTIN 300 MG PO CAPS
300.0000 mg | ORAL_CAPSULE | Freq: Three times a day (TID) | ORAL | Status: DC
  Administered 2016-11-10 – 2016-11-11 (×2): 300 mg via ORAL
  Filled 2016-11-10 (×5): qty 1

## 2016-11-10 NOTE — Progress Notes (Signed)
Recreation Therapy Notes  Date: 11/10/16 Time: 1000 Location: 500 Hall Dayroom  Group Topic: Self-Esteem  Goal Area(s) Addresses:  Patient will identify positive ways to increase self-esteem. Patient will verbalize benefit of increased self-esteem.  Behavioral Response: Anxious  Intervention: Blank mask worksheet, colored pencils  Activity: How I See Me.  Patients were given a sheet with a blank mask on it.  Patients were asked to write on the back of the sheet how they feel other people view them.  On the blank mask, patients were asked to design how they view themselves.  Patients could use pictures and words to express their thoughts.  Education:  Self-Esteem, Building control surveyorDischarge Planning.   Education Outcome: Acknowledges education/In group clarification offered/Needs additional education  Clinical Observations/Feedback: Pt stated she struggles with vanity and that the activity was giving her anxiety.  Pt just colored the mask in .   Caroll RancherMarjette Caroll Weinheimer, LRT/CTRS         Caroll RancherLindsay, Livie Vanderhoof A 11/10/2016 11:38 AM

## 2016-11-10 NOTE — Progress Notes (Signed)
Nursing Progress Note 7p-7a  D) Patient presents restless, hyperverbal and anxious. Patient reports sleeping well but is feeling "anxious" and "antsy". Patient denies SI/HI/AVH. Patient reports pain in her ankle and thumb. Sights assessed with no significant findings. Patient tangential when speaking and states "I think I am addicted to feeling manic" but also says "I need to learn how to handle that". Patient states the "air is dry here" and says she knows she needs to "use lotion" and "drink lots of water". Patient states she is "ready for Christmas" and "can't wait to go home". Patient contracts for safety at this time.  A) Emotional support given. PRN medication given for anxiety as prescribed. Patient on q15 min safety checks. Opportunities for questions or concerns presented to patient. Patient encouraged to continue to identify a new goal for today.  R) Patient receptive to interaction with nurse. Patient remains safe on the unit at this time. Patient is interacting in the milieu. Will continue to monitor.

## 2016-11-10 NOTE — Progress Notes (Signed)
Patient sleeping at this time. Patient not awakened due to lack of sleep over last several nights. Patient observed & on q15 min safety checks. Respirations even, unlabored & WNL. Bedtime medications held at this time. Will continue to monitor.

## 2016-11-10 NOTE — Progress Notes (Signed)
Patient sleeping at this time. Patient not awakened due to lack of sleep over last several nights. Patient observed & on q15 min safety checks. Respirations even, unlabored & WNL. Bedtime medications not given per nursing judgement. Will report to next shift. Will continue to monitor.

## 2016-11-10 NOTE — Progress Notes (Signed)
Patient ID: Tracy Russo, female   DOB: 09/16/1991, 25 y.o.   MRN: [id number]  Pt currently presents with an anxious affect and hyperactive behavior. Pt reports to writer that their goal is to "get something to sedate me." Pt states "I think someone planted a device inside of me while I was in [place], I'm cold and clammy and that's what happens." Pt reports good sleep with current medication regimen. Pt preoccupied with the feeling of being constipated.   Pt provided with medications per providers orders. Pt's labs and vitals were monitored throughout the night. Pt supported emotionally and encouraged to express concerns and questions. Pt educated on medications and proper bowel obstruction removal. Encouraged not to manually try to remove feces. Enforced unit rules with patient.   Pt's safety ensured with 15 minute and environmental checks. Pt currently denies SI/HI and A/V hallucinations. Pt verbally agrees to seek staff if SI/HI or A/VH occurs and to consult with staff before acting on any harmful thoughts. Pt somewhat redirectable during interaction. Will continue POC.

## 2016-11-10 NOTE — Progress Notes (Signed)
Did not attend group 

## 2016-11-10 NOTE — Tx Team (Signed)
Interdisciplinary Treatment and Diagnostic Plan Update  11/10/2016 Time of Session: 2:23 PM  Tracy Russo MRN: [id number]  Principal Diagnosis: Bipolar disorder, current episode manic, severe with psychotic features [facility])  Secondary Diagnoses: Principal Problem:   Bipolar disorder, current episode manic, severe with psychotic features ([place]) Active Problems:   Obsessive-compulsive disorder   Current Medications:  Current Facility-Administered Medications  Medication Dose Route Frequency Provider Last Rate Last Dose  . alum & mag hydroxide-simeth (MAALOX/MYLANTA) 200-200-20 MG/5ML suspension 30 mL  30 mL Oral Q4H PRN Benjamine Mola, FNP      . docusate sodium (COLACE) capsule 100 mg  100 mg Oral BID Ursula Alert, MD   100 mg at 11/10/16 0825  . gabapentin (NEURONTIN) capsule 300 mg  300 mg Oral TID Ursula Alert, MD      . hydrOXYzine (ATARAX/VISTARIL) tablet 25 mg  25 mg Oral Q6H PRN Ursula Alert, MD   25 mg at 11/09/16 1055  . ibuprofen (ADVIL,MOTRIN) tablet 600 mg  600 mg Oral Q8H PRN Benjamine Mola, FNP      . Influenza vac split quadrivalent PF (FLUARIX) injection 0.5 mL  0.5 mL Intramuscular Tomorrow-1000 Derrill Center, NP      . lidocaine (LIDODERM) 5 % 1 patch  1 patch Transdermal Q24H Ursula Alert, MD   1 patch at 11/10/16 1159  . lithium carbonate capsule 300 mg  300 mg Oral TID WC Saramma Eappen, MD   300 mg at 11/10/16 1200  . LORazepam (ATIVAN) tablet 1 mg  1 mg Oral Q6H PRN Kerrie Buffalo, NP   1 mg at 11/09/16 1429   Or  . LORazepam (ATIVAN) injection 1 mg  1 mg Intramuscular Q6H PRN Kerrie Buffalo, NP   1 mg at 11/02/16 1451  . magnesium hydroxide (MILK OF MAGNESIA) suspension 30 mL  30 mL Oral Daily PRN Benjamine Mola, FNP   30 mL at 11/06/16 2018  . nicotine (NICODERM CQ - dosed in mg/24 hours) patch 21 mg  21 mg Transdermal Daily Benjamine Mola, FNP   21 mg at 11/09/16 0806  . OLANZapine (ZYPREXA) tablet 10 mg  10 mg Oral TID PRN Ursula Alert, MD   10 mg  at 11/10/16 0609   Or  . OLANZapine (ZYPREXA) injection 10 mg  10 mg Intramuscular TID PRN Ursula Alert, MD      . ondansetron (ZOFRAN) tablet 4 mg  4 mg Oral Q8H PRN Benjamine Mola, FNP   4 mg at 11/03/16 2203  . phenylephrine-shark liver oil-mineral oil-petrolatum (PREPARATION H) rectal ointment 1 application  1 application Rectal TID Derrill Center, NP   1 application at 97/02/63 1200  . propranolol (INDERAL) tablet 10 mg  10 mg Oral TID Ursula Alert, MD   10 mg at 11/10/16 1200  . QUEtiapine (SEROQUEL) tablet 100 mg  100 mg Oral QHS Saramma Eappen, MD      . simethicone (MYLICON) chewable tablet 80 mg  80 mg Oral QID PRN Ursula Alert, MD      . traZODone (DESYREL) tablet 100 mg  100 mg Oral QHS Saramma Eappen, MD      . white petrolatum (VASELINE) gel   Topical PRN Benjamine Mola, FNP      . ziprasidone (GEODON) capsule 80 mg  80 mg Oral BID WC Ursula Alert, MD   80 mg at 11/10/16 0825    PTA Medications: Prescriptions Prior to Admission  Medication Sig Dispense Refill Last Dose  . ARIPiprazole (  ABILIFY) 15 MG tablet Take 30 mg by mouth daily.   Past Week at Unknown time  . divalproex (DEPAKOTE) 500 MG DR tablet Take 500 mg by mouth 2 (two) times daily.   Past Week at Unknown time  . LORazepam (ATIVAN) 0.5 MG tablet Take 0.5 mg by mouth 3 (three) times daily with meals.   Past Week at Unknown time  . OLANZapine (ZYPREXA) 10 MG tablet Take 20 mg by mouth at bedtime.   Past Week at Unknown time  . OLANZapine (ZYPREXA) 5 MG tablet Take 5 mg by mouth 2 (two) times daily. 5 mg with breakfast and 5 mg with lunch   Past Week at Unknown time  . PRESCRIPTION MEDICATION Take 2-4 tablets by mouth. Entumine/Clotapine - antipsychotic not available in [place]. Patient take 2-4 tablets of an unknown strength qHS for sleep.   Past Week at Unknown time    Treatment Modalities: Medication Management, Group therapy, Case management,  1 to 1 session with clinician, Psychoeducation, Recreational  therapy.   Physician Treatment Plan for Primary Diagnosis: Bipolar disorder, current episode manic, severe with psychotic features ([place]) Long Term Goal(s): Improvement in symptoms so as ready for discharge  Short Term Goals:  Compliance with prescribed medications will improve   Medication Management: Evaluate patient's response, side effects, and tolerance of medication regimen.  Therapeutic Interventions: 1 to 1 sessions, Unit Group sessions and Medication administration.  Evaluation of Outcomes: Progressing   12/7:  Patient today seen as pressured , restless at times , but making progress. For Bipolar do : Will continue Seroquel , reduce to 25 mg po bid and 400 mg po qhs. Dose reduced due to tachycardia and tiredness. Will continue Li 300 mg po bid with meals.  Li level on 11/08/16 For Insomnia: Serqouel 400 mg po qhs . 12/12: Patient is still pressured , anxious - making some progress - but continues to need treatment. For Bipolar do : Will continue to taper off seroquel. Will continue Geodon  80 mg po bid with meals. Will continue Li 300 mg po tid with meals.  Li level on 11/08/16- subtherapeutic - 0.19. Next Li level in 5 days.  For Insomnia: Reduced Serqouel 100 mg po qhs . Trazodone 100 mg po qhs .  For anxiety sx: Will increase Gabapentin to 300 mg po tid.  Physician Treatment Plan for Secondary Diagnosis: Principal Problem:   Bipolar disorder, current episode manic, severe with psychotic features ([place]) Active Problems:   Obsessive-compulsive disorder   Long Term Goal(s): Improvement in symptoms so as ready for discharge  Short Term Goals: Ability to identify changes in lifestyle to reduce recurrence of condition will improve   Medication Management: Evaluate patient's response, side effects, and tolerance of medication regimen.  Therapeutic Interventions: 1 to 1 sessions, Unit Group sessions and Medication administration.  Evaluation of Outcomes:  Progressing   RN Treatment Plan for Primary Diagnosis: Bipolar disorder, current episode manic, severe with psychotic features ([place]) Long Term Goal(s): Knowledge of disease and therapeutic regimen to maintain health will improve  Short Term Goals: Ability to demonstrate self-control and Compliance with prescribed medications will improve  Medication Management: RN will administer medications as ordered by provider, will assess and evaluate patient's response and provide education to patient for prescribed medication. RN will report any adverse and/or side effects to prescribing provider.  Therapeutic Interventions: 1 on 1 counseling sessions, Psychoeducation, Medication administration, Evaluate responses to treatment, Monitor vital signs and CBGs as ordered, Perform/monitor CIWA, COWS, AIMS and  Fall Risk screenings as ordered, Perform wound care treatments as ordered.  Evaluation of Outcomes: Progressing   Recreational Therapy Treatment Plan for Primary Diagnosis: Bipolar disorder, current episode manic, severe with psychotic features ([place]) Long Term Goal(s):  LTG- Patient will participate in recreation therapy tx in at least 2 group sessions without prompting from LRT.  Short Term Goals: Patient will be able to identify at least 5 coping skills for admitting dx by conclusion of recreation therapy tx.  Treatment Modalities: Group and Pet Therapy  Therapeutic Interventions: Psychoeducation  Evaluation of Outcomes: Progressing   LCSW Treatment Plan for Primary Diagnosis: Bipolar disorder, current episode manic, severe with psychotic features ([place]) Long Term Goal(s): Safe transition to appropriate next level of care at discharge, Engage patient in therapeutic group addressing interpersonal concerns.  Short Term Goals: Engage patient in aftercare planning with referrals and resources  Therapeutic Interventions: Assess for all discharge needs, 1 to 1 time with Social worker, Explore  available resources and support systems, Assess for adequacy in community support network, Educate family and significant other(s) on suicide prevention, Complete Psychosocial Assessment, Interpersonal group therapy.  Evaluation of Outcomes: Met   Progress in Treatment: Attending groups: No Participating in groups: No Taking medication as prescribed: Yes Toleration medication: Yes, no side effects reported at this time Family/Significant other contact made: Yes Patient understands diagnosis: No  Limited insight Discussing patient identified problems/goals with staff: Yes Medical problems stabilized or resolved: Yes Denies suicidal/homicidal ideation: Yes Issues/concerns per patient self-inventory: None Other: N/A  New problem(s) identified: None identified at this time.   New Short Term/Long Term Goal(s): None identified at this time.   Discharge Plan or Barriers:  Return home, follow up outpt  Reason for Continuation of Hospitalization: Mania Anxiety Medication stabilization   Estimated Length of Stay: 3-5 days  Attendees: Patient: 11/10/2016  2:23 PM  Physician: Ursula Alert, MD 11/10/2016  2:23 PM  Nursing: Hoy Register, RN 11/10/2016  2:23 PM  RN Care Manager: Lars Pinks, RN 11/10/2016  2:23 PM  Social Worker: Ripley Fraise 11/10/2016  2:23 PM  Recreational Therapist: Laretta Bolster  11/10/2016  2:23 PM  Other: Norberto Sorenson 11/10/2016  2:23 PM  Other:  11/10/2016  2:23 PM    Scribe for Treatment Team:  Roque Lias LCSW 11/10/2016 2:23 PM

## 2016-11-10 NOTE — Plan of Care (Signed)
Problem: Activity: Goal: Sleeping patterns will improve Outcome: Progressing Patient sleeping at appropriate times tonight. Respirations even, unlabored & WNL.

## 2016-11-10 NOTE — [facility] Group Notes (Signed)
[facility] LCSW Group Therapy  11/10/2016 , 2:22 PM   Type of Therapy:  Group Therapy  Participation Level:  Active  Participation Quality:  Attentive  Affect:  Appropriate  Cognitive:  Alert  Insight:  Improving  Engagement in Therapy:  Engaged  Modes of Intervention:  Discussion, Exploration and Socialization  Summary of Progress/Problems: Today's group focused on the term Diagnosis.  Participants were asked to define the term, and then pronounce whether it is a negative, positive or neutral term.  "I can't attend today, I am too distracted with things like thirst and hunger."  [name], Magdala Brahmbhatt B 11/10/2016 , 2:22 PM

## 2016-11-10 NOTE — Progress Notes (Signed)
[facility] MD Progress Note  11/10/2016 1:24 PM Tracy Russo  MRN:  [id number] Subjective:  Pt states " I cannot pay attention. I feel anxious and restless and I am not sure what to do with my energy,"     Objective: Tracy Russo is a 76 y old CF , who is single , who was recently diagnosed with Bipolar do , was doing a teaching job in [place] , was brought in for behavioral changes to [facility].  Patient seen and chart reviewed.Discussed patient with treatment team.  Pt today is seen as anxious , restless. Pt continues to be tangential - pressured often. Pt per staff slept well last night which is an improvement. Pt denies any concerns with her medications today.  Will continue to support.   Principal Problem: Bipolar disorder, current episode manic, severe with psychotic features (HCC) Diagnosis:   Patient Active Problem List   Diagnosis Date Noted  . Bipolar disorder, current episode manic, severe with psychotic features (HCC) [F31.2] 11/03/2016  . Obsessive-compulsive disorder [F42.9]    Total Time spent with patient: 25 minutes  Past Psychiatric History: Please see H&P.   Past Medical History: hx of thyroid nodule and S/P surgery while in elementary school, denies any issues now. Family History:  Family History  Problem Relation Age of Onset  . Bipolar disorder Mother   . Hypertension Father    Family Psychiatric  History: mother has Bipolar do , did well on Li and stelazine for 15 yrs , had to stop it when her renal function became abnormal. Social History: Please see H&P.  History  Alcohol Use  . Yes    Comment: occasional drinker, less than 2 drinks per week     History  Drug use: Unknown    Social History   Social History  . Marital status: Single    Spouse name: N/A  . Number of children: N/A  . Years of education: N/A   Social History Main Topics  . Smoking status: Never Smoker  . Smokeless tobacco: Never Used  . Alcohol use Yes     Comment: occasional  drinker, less than 2 drinks per week  . Drug use: Unknown  . Sexual activity: Not Asked   Other Topics Concern  . None   Social History Narrative  . None   Additional Social History:                         Sleep: Fair  Appetite:  Fair  Current Medications: Current Facility-Administered Medications  Medication Dose Route Frequency Provider Last Rate Last Dose  . alum & mag hydroxide-simeth (MAALOX/MYLANTA) 200-200-20 MG/5ML suspension 30 mL  30 mL Oral Q4H PRN Beau Fanny, FNP      . docusate sodium (COLACE) capsule 100 mg  100 mg Oral BID Jomarie Longs, MD   100 mg at 11/10/16 0825  . gabapentin (NEURONTIN) capsule 300 mg  300 mg Oral TID Jomarie Longs, MD      . hydrOXYzine (ATARAX/VISTARIL) tablet 25 mg  25 mg Oral Q6H PRN Jomarie Longs, MD   25 mg at 11/09/16 1055  . ibuprofen (ADVIL,MOTRIN) tablet 600 mg  600 mg Oral Q8H PRN Beau Fanny, FNP      . Influenza vac split quadrivalent PF (FLUARIX) injection 0.5 mL  0.5 mL Intramuscular Tomorrow-1000 Oneta Rack, NP      . lidocaine (LIDODERM) 5 % 1 patch  1 patch Transdermal Q24H Metzli Pollick,  MD   1 patch at 11/10/16 1159  . lithium carbonate capsule 300 mg  300 mg Oral TID WC Fermina Mishkin, MD   300 mg at 11/10/16 1200  . LORazepam (ATIVAN) tablet 1 mg  1 mg Oral Q6H PRN Adonis BrookSheila Agustin, NP   1 mg at 11/09/16 1429   Or  . LORazepam (ATIVAN) injection 1 mg  1 mg Intramuscular Q6H PRN Adonis BrookSheila Agustin, NP   1 mg at 11/02/16 1451  . magnesium hydroxide (MILK OF MAGNESIA) suspension 30 mL  30 mL Oral Daily PRN Beau FannyJohn C Withrow, FNP   30 mL at 11/06/16 2018  . nicotine (NICODERM CQ - dosed in mg/24 hours) patch 21 mg  21 mg Transdermal Daily Beau FannyJohn C Withrow, FNP   21 mg at 11/09/16 0806  . OLANZapine (ZYPREXA) tablet 10 mg  10 mg Oral TID PRN Jomarie LongsSaramma Larenzo Caples, MD   10 mg at 11/10/16 0609   Or  . OLANZapine (ZYPREXA) injection 10 mg  10 mg Intramuscular TID PRN Jomarie LongsSaramma Sulema Braid, MD      . ondansetron (ZOFRAN) tablet 4  mg  4 mg Oral Q8H PRN Beau FannyJohn C Withrow, FNP   4 mg at 11/03/16 2203  . phenylephrine-shark liver oil-mineral oil-petrolatum (PREPARATION H) rectal ointment 1 application  1 application Rectal TID Oneta Rackanika N Lewis, NP   1 application at 11/10/16 1200  . propranolol (INDERAL) tablet 10 mg  10 mg Oral TID Jomarie LongsSaramma Eon Zunker, MD   10 mg at 11/10/16 1200  . QUEtiapine (SEROQUEL) tablet 100 mg  100 mg Oral QHS Teri Diltz, MD      . simethicone (MYLICON) chewable tablet 80 mg  80 mg Oral QID PRN Jomarie LongsSaramma Jacory Kamel, MD      . traZODone (DESYREL) tablet 100 mg  100 mg Oral QHS Alisha Burgo, MD      . white petrolatum (VASELINE) gel   Topical PRN Beau FannyJohn C Withrow, FNP      . ziprasidone (GEODON) capsule 80 mg  80 mg Oral BID WC Reedy Biernat, MD   80 mg at 11/10/16 0825    Lab Results:  No results found for this or any previous visit (from the past 48 hour(s)).  Blood Alcohol level:  Lab Results  Component Value Date   ETH <5 10/30/2016    Metabolic Disorder Labs: Lab Results  Component Value Date   HGBA1C 4.8 11/02/2016   MPG 91 11/02/2016   Lab Results  Component Value Date   PROLACTIN 26.5 (H) 11/02/2016   Lab Results  Component Value Date   CHOL 108 11/02/2016   TRIG 51 11/02/2016   HDL 35 (L) 11/02/2016   CHOLHDL 3.1 11/02/2016   VLDL 10 11/02/2016   LDLCALC 63 11/02/2016    Physical Findings: AIMS: Facial and Oral Movements Muscles of Facial Expression: None, normal Lips and Perioral Area: None, normal Jaw: None, normal Tongue: None, normal,Extremity Movements Upper (arms, wrists, hands, fingers): None, normal Lower (legs, knees, ankles, toes): None, normal, Trunk Movements Neck, shoulders, hips: None, normal, Overall Severity Severity of abnormal movements (highest score from questions above): None, normal Incapacitation due to abnormal movements: None, normal Patient's awareness of abnormal movements (rate only patient's report): No Awareness, Dental Status Current problems  with teeth and/or dentures?: No Does patient usually wear dentures?: No  CIWA:    COWS:     Musculoskeletal: Strength & Muscle Tone: within normal limits Gait & Station: normal Patient leans: N/A  Psychiatric Specialty Exam: Physical Exam  Nursing note and  vitals reviewed.   Review of Systems  Psychiatric/Behavioral: The patient is nervous/anxious.   All other systems reviewed and are negative.   Blood pressure 115/73, pulse 90, temperature 98.3 F (36.8 C), temperature source Oral, resp. rate 16, height 5\' 7"  (1.702 m), weight 62.6 kg (138 lb), last menstrual period 10/14/2016.Body mass index is 21.61 kg/m.  General Appearance: Casual  Eye Contact:  Fair  Speech:  Pressured  Volume:  Increased  Mood:  Anxious and Dysphoric improving  Affect:  Labile and Tearful  Thought Process:  Disorganized and Descriptions of Associations: Tangential needs redirection  Orientation:  Full (Time, Place, and Person)  Thought Content:  Paranoid Ideation and Rumination improving  Suicidal Thoughts:  No  Homicidal Thoughts:  No  Memory:  Immediate;   Fair Recent;   Fair Remote;   Fair  Judgement:  Fair  Insight:  Fair  Psychomotor Activity:  Restlessness  Concentration:  Concentration: Poor and Attention Span: Fair  Recall:  [employer] of Knowledge:  Fair  Language:  Fair  Akathisia:  No  Handed:  Right  AIMS (if indicated):     Assets:  Desire for Improvement  ADL's:  Intact  Cognition:  WNL  Sleep:  Number of Hours: 4   11/09/16: Spoke to mother again - gave update about progress.  11/05/16 : Mother contacted Clinical research associate - wanted an update about patients progress. Discussed that she is showing some progress on the medications . Also she wanted to know if Clinical research associate received records from [place].    11/04/16 Reviewed records obtained from [place] -  [facility] - mental health facility : As per records pt was admitted there from another facility [facility]) on  November 17 th , with a manic episode . Pt at that time presented as delusional , with sexual disinhibition , high energy ) was started on olanzapine titrated up to 30 mg per day , aripiprazol 30 mg as well as Depakine ER 500 mg , Lorazepam 5 mg po tid , etumina qhs as well as received IM Clopixol Acufase 50 mg prior to transfer to [place] with her parents on November 28 th.    Treatment plan and summary: Patient is still pressured , anxious - making some progress - but continues to need treatment.   Bipolar disorder, current episode manic, severe with psychotic features (HCC) unstable  Will continue today 11/10/16 plan as below except where it is noted.  Daily contact with patient to assess and evaluate symptoms and progress in treatment and Medication management  Reviewed past medical records,treatment plan.   For Bipolar do : Will continue to taper off seroquel. Will continue Geodon  80 mg po bid with meals. Will continue Li 300 mg po tid with meals.  Li level on 11/08/16- subtherapeutic - 0.19. Next Li level in 5 days.  For Insomnia: Reduced Serqouel 100 mg po qhs . Trazodone 100 mg po qhs .  For anxiety sx: Will increase Gabapentin to 300 mg po tid.  For tachycardia: Propranolol 10 mg po tid. For constipation: Colace 100 mg po bid.  For anxiety/agitation: Will continue PRN medication as per agitation protocol.  Will continue to monitor vitals ,medication compliance and treatment side effects while patient is here.   Will monitor for medical issues as well as call consult as needed.   Reviewed labs lipid panel - wnl, hba1c- wnl, pl - slightly elevated , tsh - wnl.  I have reviewed EKG for qtc - wnl.  CSW will continue working on disposition.   Patient to participate in therapeutic milieu .      Rice Walsh, MD 11/10/2016, 1:24 PM

## 2016-11-10 NOTE — Progress Notes (Signed)
D: Pt A & O to self and place. Presents animated and anxious. Speech is till disorganized, tangential with religious themes; especially this AM. Observed preaching and pacing hall at the time, however, pt has been more redirectable and less impulsive, intrusive this shift.   A: Support and availability provided to pt. Encouraged to voice concerns, comply with medications and unit routines including groups. All medications administered as prescribed. Safety checks continues throughout this shift.  R: Pt receptive to care. Denies adverse drug reactions at this time. Attended all scheduled groups. Went off unit for meals returned without issues; tolerates all PO well. Pt remains safe on and off unit without outburst.

## 2016-11-11 MED ORDER — ZIPRASIDONE HCL 80 MG PO CAPS
80.0000 mg | ORAL_CAPSULE | Freq: Every day | ORAL | Status: DC
  Administered 2016-11-12 – 2016-11-15 (×4): 80 mg via ORAL
  Filled 2016-11-11 (×6): qty 1

## 2016-11-11 MED ORDER — GABAPENTIN 300 MG PO CAPS
300.0000 mg | ORAL_CAPSULE | Freq: Three times a day (TID) | ORAL | Status: DC
  Administered 2016-11-11 – 2016-11-14 (×13): 300 mg via ORAL
  Filled 2016-11-11 (×20): qty 1

## 2016-11-11 MED ORDER — DOCUSATE SODIUM 100 MG PO CAPS
100.0000 mg | ORAL_CAPSULE | Freq: Three times a day (TID) | ORAL | Status: DC
Start: 2016-11-11 — End: 2016-11-15
  Administered 2016-11-12 – 2016-11-15 (×11): 100 mg via ORAL
  Filled 2016-11-11 (×18): qty 1

## 2016-11-11 MED ORDER — ZIPRASIDONE HCL 20 MG PO CAPS
100.0000 mg | ORAL_CAPSULE | Freq: Every day | ORAL | Status: DC
  Administered 2016-11-11 – 2016-11-14 (×4): 100 mg via ORAL
  Filled 2016-11-11 (×6): qty 5

## 2016-11-11 NOTE — Progress Notes (Signed)
Recreation Therapy Notes  Date: 11/11/16 Time: 1020 Location: 500 Hall Dayroom  Group Topic: Leisure Education  Goal Area(s) Addresses:  Patient will identify positive leisure activities.  Patient will identify one positive benefit of participation in leisure activities.   Intervention: 20 plastic cups, 2 soft spongy balls  Activity: Bowling.  LRT split the group into two teams.  Each person was given two chances to knock the "pins" over except if a person bowled a strike.  Each "pin" knocked over counted as one point.  The first team to reach 80 points won the game.  Education:  Leisure Education, Discharge Planning  Education Outcome: Acknowledges education/In group clarification offered/Needs additional education  Clinical Observations/Feedback: Pt did not attend group.   Jacobi Ryant, LRT/CTRS         Sherree Shankman A 11/11/2016 12:17 PM 

## 2016-11-11 NOTE — Progress Notes (Signed)
Adult Psychoeducational Group Note  Date:  11/11/2016 Time:  8:43 PM  Group Topic/Focus:  Wrap-Up Group:   The focus of this group is to help patients review their daily goal of treatment and discuss progress on daily workbooks.   Participation Level:  Active  Participation Quality:  Appropriate  Affect:  Appropriate  Cognitive:  Alert  Insight: Appropriate  Engagement in Group:  Engaged  Modes of Intervention:  Discussion  Additional Comments:  Patient states, "I had a good day. Felt a anxiety". Patient's goal for today was to care about others. Rey Fors L Ormand Senn 11/11/2016, 8:43 PM

## 2016-11-11 NOTE — Progress Notes (Signed)
Patient ID: Tracy Russo, female   DOB: 08/27/1991, 25 y.o.   MRN: [id number]  Pt currently presents with a flat affect and hyperactive behavior. Pt reports to writer that their goal is to "take my medications and get better." Pt states "I am starting to feel things again, like my emotions and hunger" Pt reports intermittent sleep disturbance with current medication regimen. Pt reports some "resentment" about the treatment she got in [place] before her parents arrived, pt reports remembering "them giving me shots in my butt and now I don't like to look at needle." Pt is able to voice plans for the future.   Pt provided with medications per providers orders. Pt's labs and vitals were monitored throughout the night. Pt supported emotionally and encouraged to express concerns and questions. Pt educated on medications. Encouraged patient to reach out to resources post discharge and make a list of [facility] to [facility] staff [facility].   Pt's safety ensured with 15 minute and environmental checks. Pt currently denies SI/HI and A/V hallucinations. Pt verbally agrees to seek staff if SI/HI or A/VH occurs and to consult with staff before acting on any harmful thoughts. Pt has better control of her impulses today. Will continue POC.

## 2016-11-11 NOTE — [facility] Group Notes (Signed)
[facility]  11/11/2016 , 12:14 PM    Type of Therapy:  Mental Health Association Presentation  Participation Level:  Active  Participation Quality:  Attentive  Affect:  Blunted  Cognitive:  Oriented  Insight:  Limited  Engagement in Therapy:  Engaged  Modes of Intervention:  Discussion, Education and Socialization  Summary of Progress/Problems:  Tracy HuaDavid from [facility] came to present his recovery story and play the guitar.  Told me she was invested in being in group, "but I may need to get up and walk around to burn off energy."  Told her that was fine.  Came initially, difficult to sit quietly.  Left after 25 minutes.  Did not return.  Tracy Russo, Tracy Russo 11/11/2016 , 12:14 PM

## 2016-11-11 NOTE — Progress Notes (Signed)
D: Patient is A&Ox4, denies SI/HI and AVH, patient did say she was talking to her friend out loud but knew she wasn't there, stated she just needed to get the information out. Patient is slightly intrusive, manic, preoccupied with cleaning and going home, but redirectable. Patient is labile but easily redirectable.  A: Patient given medications as scheduled and prn. Emotional support given as needed. Continue monitoring patient q15 minutes and prn. R: Patient remains safe. Patient verbalized understanding to let staff know if she no longer feels safe.  Syed Zukas, Wyman SongsterAngela Marie, RN

## 2016-11-11 NOTE — Progress Notes (Signed)
[facility] MD Progress Note  11/11/2016 11:57 AM Tracy Russo  MRN:  [id number] Subjective:  Pt states " I still have all these things in me that I feel like I cannot internalize. I need to bring it out."     Objective: Tracy Russo is a 19 y old CF , who is single , who was recently diagnosed with Bipolar do , was doing a teaching job in [place] , was brought in for behavioral changes to [facility].  Patient seen and chart reviewed.Discussed patient with treatment team.  Pt today is seen as hyperactive and pressured often , but with improvement. Pt continues to have anxiety - reports she does not like being in the hospital and that the uncertainty is making her more anxious. Discussed medication changes . Pt continues to have constipation - will increase the dose of colace. Continue to encourage and support.    Principal Problem: Bipolar disorder, current episode manic, severe with psychotic features (HCC) Diagnosis:   Patient Active Problem List   Diagnosis Date Noted  . Bipolar disorder, current episode manic, severe with psychotic features (HCC) [F31.2] 11/03/2016  . Obsessive-compulsive disorder [F42.9]    Total Time spent with patient: 25 minutes  Past Psychiatric History: Please see H&P.   Past Medical History: hx of thyroid nodule and S/P surgery while in elementary school, denies any issues now. Family History:  Family History  Problem Relation Age of Onset  . Bipolar disorder Mother   . Hypertension Father    Family Psychiatric  History: mother has Bipolar do , did well on Li and stelazine for 15 yrs , had to stop it when her renal function became abnormal. Social History: Please see H&P.  History  Alcohol Use  . Yes    Comment: occasional drinker, less than 2 drinks per week     History  Drug use: Unknown    Social History   Social History  . Marital status: Single    Spouse name: N/A  . Number of children: N/A  . Years of education: N/A   Social History Main  Topics  . Smoking status: Never Smoker  . Smokeless tobacco: Never Used  . Alcohol use Yes     Comment: occasional drinker, less than 2 drinks per week  . Drug use: Unknown  . Sexual activity: Not Asked   Other Topics Concern  . None   Social History Narrative  . None   Additional Social History:                         Sleep: Fair , restless at times  Appetite:  Fair  Current Medications: Current Facility-Administered Medications  Medication Dose Route Frequency Provider Last Rate Last Dose  . alum & mag hydroxide-simeth (MAALOX/MYLANTA) 200-200-20 MG/5ML suspension 30 mL  30 mL Oral Q4H PRN Beau Fanny, FNP      . docusate sodium (COLACE) capsule 100 mg  100 mg Oral TID Jomarie Longs, MD      . gabapentin (NEURONTIN) capsule 300 mg  300 mg Oral TID PC & HS Jolyn Deshmukh, MD      . hydrOXYzine (ATARAX/VISTARIL) tablet 25 mg  25 mg Oral Q6H PRN Jomarie Longs, MD   25 mg at 11/09/16 1055  . ibuprofen (ADVIL,MOTRIN) tablet 600 mg  600 mg Oral Q8H PRN Beau Fanny, FNP      . Influenza vac split quadrivalent PF (FLUARIX) injection 0.5 mL  0.5 mL Intramuscular  Tomorrow-1000 Oneta Rack, NP      . lidocaine (LIDODERM) 5 % 1 patch  1 patch Transdermal Q24H Jomarie Longs, MD   1 patch at 11/11/16 1026  . lithium carbonate capsule 300 mg  300 mg Oral TID WC Jomarie Longs, MD   300 mg at 11/11/16 0620  . LORazepam (ATIVAN) tablet 1 mg  1 mg Oral Q6H PRN Adonis Brook, NP   1 mg at 11/09/16 1429   Or  . LORazepam (ATIVAN) injection 1 mg  1 mg Intramuscular Q6H PRN Adonis Brook, NP   1 mg at 11/02/16 1451  . magnesium hydroxide (MILK OF MAGNESIA) suspension 30 mL  30 mL Oral Daily PRN Beau Fanny, FNP   30 mL at 11/06/16 2018  . nicotine (NICODERM CQ - dosed in mg/24 hours) patch 21 mg  21 mg Transdermal Daily Beau Fanny, FNP   21 mg at 11/09/16 0806  . OLANZapine (ZYPREXA) tablet 10 mg  10 mg Oral TID PRN Jomarie Longs, MD   10 mg at 11/11/16 0946   Or   . OLANZapine (ZYPREXA) injection 10 mg  10 mg Intramuscular TID PRN Jomarie Longs, MD      . ondansetron (ZOFRAN) tablet 4 mg  4 mg Oral Q8H PRN Beau Fanny, FNP   4 mg at 11/03/16 2203  . phenylephrine-shark liver oil-mineral oil-petrolatum (PREPARATION H) rectal ointment 1 application  1 application Rectal TID Oneta Rack, NP   1 application at 11/10/16 1705  . propranolol (INDERAL) tablet 10 mg  10 mg Oral TID Jomarie Longs, MD   10 mg at 11/11/16 0811  . QUEtiapine (SEROQUEL) tablet 100 mg  100 mg Oral QHS Jomarie Longs, MD   100 mg at 11/10/16 2141  . simethicone (MYLICON) chewable tablet 80 mg  80 mg Oral QID PRN Jomarie Longs, MD      . traZODone (DESYREL) tablet 100 mg  100 mg Oral QHS Jomarie Longs, MD   100 mg at 11/10/16 2141  . white petrolatum (VASELINE) gel   Topical PRN Beau Fanny, FNP      . ziprasidone (GEODON) capsule 100 mg  100 mg Oral Q supper Jomarie Longs, MD      . Melene Muller ON 11/12/2016] ziprasidone (GEODON) capsule 80 mg  80 mg Oral Q breakfast Jomarie Longs, MD        Lab Results:  No results found for this or any previous visit (from the past 48 hour(s)).  Blood Alcohol level:  Lab Results  Component Value Date   ETH <5 10/30/2016    Metabolic Disorder Labs: Lab Results  Component Value Date   HGBA1C 4.8 11/02/2016   MPG 91 11/02/2016   Lab Results  Component Value Date   PROLACTIN 26.5 (H) 11/02/2016   Lab Results  Component Value Date   CHOL 108 11/02/2016   TRIG 51 11/02/2016   HDL 35 (L) 11/02/2016   CHOLHDL 3.1 11/02/2016   VLDL 10 11/02/2016   LDLCALC 63 11/02/2016    Physical Findings: AIMS: Facial and Oral Movements Muscles of Facial Expression: None, normal Lips and Perioral Area: None, normal Jaw: None, normal Tongue: None, normal,Extremity Movements Upper (arms, wrists, hands, fingers): None, normal Lower (legs, knees, ankles, toes): None, normal, Trunk Movements Neck, shoulders, hips: None, normal, Overall  Severity Severity of abnormal movements (highest score from questions above): None, normal Incapacitation due to abnormal movements: None, normal Patient's awareness of abnormal movements (rate only patient's report): No Awareness,  Dental Status Current problems with teeth and/or dentures?: No Does patient usually wear dentures?: No  CIWA:    COWS:     Musculoskeletal: Strength & Muscle Tone: within normal limits Gait & Station: normal Patient leans: N/A  Psychiatric Specialty Exam: Physical Exam  Nursing note and vitals reviewed.   Review of Systems  Psychiatric/Behavioral: The patient is nervous/anxious.   All other systems reviewed and are negative.   Blood pressure 120/84, pulse (!) 118, temperature 97.8 F (36.6 C), temperature source Oral, resp. rate 18, height 5\' 7"  (1.702 m), weight 62.6 kg (138 lb), last menstrual period 10/14/2016.Body mass index is 21.61 kg/m.  General Appearance: Casual  Eye Contact:  Fair  Speech:  Pressured  Volume:  Increased  Mood:  Anxious and Dysphoric improving  Affect:  Labile and Tearful  Thought Process:  Disorganized and Descriptions of Associations: Tangential is more redirectable, is more organized  Orientation:  Full (Time, Place, and Person)  Thought Content:  Paranoid Ideation and Rumination improving  Suicidal Thoughts:  No  Homicidal Thoughts:  No  Memory:  Immediate;   Fair Recent;   Fair Remote;   Fair  Judgement:  Fair  Insight:  Fair  Psychomotor Activity:  Restlessness  Concentration:  Concentration: Poor and Attention Span: Fair  Recall:  FiservFair  Fund of Knowledge:  Fair  Language:  Fair  Akathisia:  No  Handed:  Right  AIMS (if indicated):     Assets:  Desire for Improvement  ADL's:  Intact  Cognition:  WNL  Sleep:  Number of Hours: 6.75   11/09/16: Spoke to mother again - gave update about progress.  11/05/16 : Mother contacted Clinical research associatewriter - wanted an update about patients progress. Discussed that she is showing  some progress on the medications . Also she wanted to know if Clinical research associatewriter received records from [place].    11/04/16 Reviewed records obtained from [place]  [facility] : As per records pt was admitted there from another facility [facility]( [facility]) on November 17 th , with a manic episode . Pt at that time presented as delusional , with sexual disinhibition , high energy ) was started on olanzapine titrated up to 30 mg per day , aripiprazol 30 mg as well as Depakine ER 500 mg , Lorazepam 5 mg po tid , etumina qhs as well as received IM Clopixol Acufase 50 mg prior to transfer to [place] with [place] on November 28 th.    Treatment plan and summary: Patient is still manic and anxious - although improving. She continues to need treatment.   Bipolar disorder, current episode manic, severe with psychotic features (HCC) unstable- improving   Will continue today 11/11/16 plan as below except where it is noted.  Daily contact with patient to assess and evaluate symptoms and progress in treatment and Medication management  Reviewed past medical records,treatment plan.   For Bipolar do : Will continue to taper off seroquel. Will increase Geodon to 80 mg po daily and 100 mg po qpm with meals. Will continue Li 300 mg po tid with meals.  Li level on 11/08/16- subtherapeutic - 0.19. Next Li level on 11/13/16.  For Insomnia: Reduced Serqouel 100 mg po qhs . Trazodone 100 mg po qhs .  For anxiety sx: Will increase Gabapentin to 300 mg po qid.  For tachycardia: Propranolol 10 mg po tid. For constipation: Increase Colace to 100 mg po tid .  For anxiety/agitation: Will continue PRN  medication as per agitation protocol.  Will continue to monitor vitals ,medication compliance and treatment side effects while patient is here.   Will monitor for medical issues as well as call consult as needed.   Reviewed labs lipid panel - wnl, hba1c- wnl, pl - slightly  elevated , tsh - wnl.  I have reviewed EKG for qtc - wnl.   CSW will continue working on disposition.   Patient to participate in therapeutic milieu .      Tracy Rosell, MD 11/11/2016, 11:57 AM

## 2016-11-12 MED ORDER — TRAZODONE HCL 150 MG PO TABS
150.0000 mg | ORAL_TABLET | Freq: Every day | ORAL | Status: DC
  Administered 2016-11-12 – 2016-11-14 (×3): 150 mg via ORAL
  Filled 2016-11-12 (×5): qty 1

## 2016-11-12 MED ORDER — BACITRACIN-NEOMYCIN-POLYMYXIN OINTMENT TUBE
TOPICAL_OINTMENT | Freq: Every day | CUTANEOUS | Status: DC
  Administered 2016-11-12 – 2016-11-14 (×3): via TOPICAL
  Filled 2016-11-12: qty 1
  Filled 2016-11-12: qty 15

## 2016-11-12 MED ORDER — QUETIAPINE FUMARATE 50 MG PO TABS: 50.0000 mg | ORAL_TABLET | Freq: Every day | ORAL | Status: DC

## 2016-11-12 NOTE — Progress Notes (Signed)
Recreation Therapy Notes  Date: 11/12/16 Time: 1000 Location: 500 Hall Dayroom   Group Topic: Communication, Team Building, Problem Solving  Goal Area(s) Addresses:  Patient will effectively work with peer towards shared goal.  Patient will identify skills used to make activity successful.  Patient will identify how skills used during activity can be used to reach post d/c goals.   Behavioral Response: Engaged  Intervention: STEM Activity  Activity: Stage managerLanding Pad. In teams patients were given 12 plastic drinking straws and a length of masking tape. Using the materials provided patients were asked to build a landing pad to catch a golf ball dropped from approximately 6 feet in the air.   Education: Pharmacist, communityocial Skills, Discharge Planning   Education Outcome: Acknowledges education/In group clarification offered/Needs additional education.   Clinical Observations/Feedback: Pt stated teamwork takes cooperation and communication.  Pt worked well with her peer.  Pt was focused and on task throughout group.  Pt left early because she stated she wasn't feeling well.   Caroll RancherMarjette Elizabelle Fite, LRT/CTRS         Caroll RancherLindsay, Creta Dorame A 11/12/2016 11:46 AM

## 2016-11-12 NOTE — Tx Team (Signed)
Interdisciplinary Treatment and Diagnostic Plan Update  11/12/2016 Time of Session: 8:41 AM  Kanai Hilger MRN: [id number]  Principal Diagnosis: Bipolar disorder, current episode manic, severe with psychotic features ([place])  Secondary Diagnoses: Principal Problem:   Bipolar disorder, current episode manic, severe with psychotic features ([place]) Active Problems:   Obsessive-compulsive disorder   Current Medications:  Current Facility-Administered Medications  Medication Dose Route Frequency Provider Last Rate Last Dose  . alum & mag hydroxide-simeth (MAALOX/MYLANTA) 200-200-20 MG/5ML suspension 30 mL  30 mL Oral Q4H PRN Benjamine Mola, FNP      . docusate sodium (COLACE) capsule 100 mg  100 mg Oral TID Ursula Alert, MD   100 mg at 11/12/16 0755  . gabapentin (NEURONTIN) capsule 300 mg  300 mg Oral TID PC & HS Saramma Eappen, MD   300 mg at 11/12/16 0800  . hydrOXYzine (ATARAX/VISTARIL) tablet 25 mg  25 mg Oral Q6H PRN Ursula Alert, MD   25 mg at 11/09/16 1055  . ibuprofen (ADVIL,MOTRIN) tablet 600 mg  600 mg Oral Q8H PRN Benjamine Mola, FNP   600 mg at 11/12/16 6948  . Influenza vac split quadrivalent PF (FLUARIX) injection 0.5 mL  0.5 mL Intramuscular Tomorrow-1000 Derrill Center, NP      . lidocaine (LIDODERM) 5 % 1 patch  1 patch Transdermal Q24H Ursula Alert, MD   1 patch at 11/11/16 1026  . lithium carbonate capsule 300 mg  300 mg Oral TID WC Saramma Eappen, MD   300 mg at 11/12/16 0600  . LORazepam (ATIVAN) tablet 1 mg  1 mg Oral Q6H PRN Kerrie Buffalo, NP   1 mg at 11/11/16 2008   Or  . LORazepam (ATIVAN) injection 1 mg  1 mg Intramuscular Q6H PRN Kerrie Buffalo, NP   1 mg at 11/02/16 1451  . magnesium hydroxide (MILK OF MAGNESIA) suspension 30 mL  30 mL Oral Daily PRN Benjamine Mola, FNP   30 mL at 11/06/16 2018  . nicotine (NICODERM CQ - dosed in mg/24 hours) patch 21 mg  21 mg Transdermal Daily Benjamine Mola, FNP   21 mg at 11/12/16 0755  . OLANZapine (ZYPREXA) tablet  10 mg  10 mg Oral TID PRN Ursula Alert, MD   10 mg at 11/11/16 0946   Or  . OLANZapine (ZYPREXA) injection 10 mg  10 mg Intramuscular TID PRN Ursula Alert, MD      . ondansetron (ZOFRAN) tablet 4 mg  4 mg Oral Q8H PRN Benjamine Mola, FNP   4 mg at 11/03/16 2203  . phenylephrine-shark liver oil-mineral oil-petrolatum (PREPARATION H) rectal ointment 1 application  1 application Rectal TID Derrill Center, NP   1 application at 54/62/70 1705  . propranolol (INDERAL) tablet 10 mg  10 mg Oral TID Ursula Alert, MD   10 mg at 11/12/16 0757  . QUEtiapine (SEROQUEL) tablet 100 mg  100 mg Oral QHS Ursula Alert, MD   100 mg at 11/11/16 2051  . simethicone (MYLICON) chewable tablet 80 mg  80 mg Oral QID PRN Ursula Alert, MD      . traZODone (DESYREL) tablet 100 mg  100 mg Oral QHS Ursula Alert, MD   100 mg at 11/11/16 2051  . white petrolatum (VASELINE) gel   Topical PRN Benjamine Mola, FNP      . ziprasidone (GEODON) capsule 100 mg  100 mg Oral Q supper Ursula Alert, MD   100 mg at 11/11/16 1731  . ziprasidone (GEODON)  capsule 80 mg  80 mg Oral Q breakfast Ursula Alert, MD   80 mg at 11/12/16 0758    PTA Medications: Prescriptions Prior to Admission  Medication Sig Dispense Refill Last Dose  . ARIPiprazole (ABILIFY) 15 MG tablet Take 30 mg by mouth daily.   Past Week at Unknown time  . divalproex (DEPAKOTE) 500 MG DR tablet Take 500 mg by mouth 2 (two) times daily.   Past Week at Unknown time  . LORazepam (ATIVAN) 0.5 MG tablet Take 0.5 mg by mouth 3 (three) times daily with meals.   Past Week at Unknown time  . OLANZapine (ZYPREXA) 10 MG tablet Take 20 mg by mouth at bedtime.   Past Week at Unknown time  . OLANZapine (ZYPREXA) 5 MG tablet Take 5 mg by mouth 2 (two) times daily. 5 mg with breakfast and 5 mg with lunch   Past Week at Unknown time  . PRESCRIPTION MEDICATION Take 2-4 tablets by mouth. Entumine/Clotapine - antipsychotic not available in [place]. Patient take 2-4 tablets of an  unknown strength qHS for sleep.   Past Week at Unknown time    Treatment Modalities: Medication Management, Group therapy, Case management,  1 to 1 session with clinician, Psychoeducation, Recreational therapy.   Physician Treatment Plan for Primary Diagnosis: Bipolar disorder, current episode manic, severe with psychotic features ([place]) Long Term Goal(s): Improvement in symptoms so as ready for discharge  Short Term Goals:  Compliance with prescribed medications will improve   Medication Management: Evaluate patient's response, side effects, and tolerance of medication regimen.  Therapeutic Interventions: 1 to 1 sessions, Unit Group sessions and Medication administration.  Evaluation of Outcomes: Progressing   12/7:  Patient today seen as pressured , restless at times , but making progress. For Bipolar do : Will continue Seroquel , reduce to 25 mg po bid and 400 mg po qhs. Dose reduced due to tachycardia and tiredness. Will continue Li 300 mg po bid with meals.  Li level on 11/08/16 For Insomnia: Serqouel 400 mg po qhs . 12/12: Patient is still pressured , anxious - making some progress - but continues to need treatment. For Bipolar do : Will continue to taper off seroquel. Will continue Geodon  80 mg po bid with meals. Will continue Li 300 mg po tid with meals.  Li level on 11/08/16- subtherapeutic - 0.19. Next Li level in 5 days.  For Insomnia: Reduced Serqouel 100 mg po qhs . Trazodone 100 mg po qhs .  For anxiety sx: Will increase Gabapentin to 300 mg po tid.  Physician Treatment Plan for Secondary Diagnosis: Principal Problem:   Bipolar disorder, current episode manic, severe with psychotic features ([place]) Active Problems:   Obsessive-compulsive disorder   Long Term Goal(s): Improvement in symptoms so as ready for discharge  Short Term Goals: Ability to identify changes in lifestyle to reduce recurrence of condition will improve   Medication Management:  Evaluate patient's response, side effects, and tolerance of medication regimen.  Therapeutic Interventions: 1 to 1 sessions, Unit Group sessions and Medication administration.  Evaluation of Outcomes: Adequate for discharge   RN Treatment Plan for Primary Diagnosis: Bipolar disorder, current episode manic, severe with psychotic features ([place]) Long Term Goal(s): Knowledge of disease and therapeutic regimen to maintain health will improve  Short Term Goals: Ability to demonstrate self-control and Compliance with prescribed medications will improve  Medication Management: RN will administer medications as ordered by provider, will assess and evaluate patient's response and provide education to patient for  prescribed medication. RN will report any adverse and/or side effects to prescribing provider.  Therapeutic Interventions: 1 on 1 counseling sessions, Psychoeducation, Medication administration, Evaluate responses to treatment, Monitor vital signs and CBGs as ordered, Perform/monitor CIWA, COWS, AIMS and Fall Risk screenings as ordered, Perform wound care treatments as ordered.  Evaluation of Outcomes: Adequate for Discharge   Recreational Therapy Treatment Plan for Primary Diagnosis: Bipolar disorder, current episode manic, severe with psychotic features ([place]) Long Term Goal(s):  LTG- Patient will participate in recreation therapy tx in at least 2 group sessions without prompting from LRT.  Short Term Goals: Patient will be able to identify at least 5 coping skills for admitting dx by conclusion of recreation therapy tx.  Treatment Modalities: Group and Pet Therapy  Therapeutic Interventions: Psychoeducation  Evaluation of Outcomes: Adequate for Discharge   LCSW Treatment Plan for Primary Diagnosis: Bipolar disorder, current episode manic, severe with psychotic features ([place]) Long Term Goal(s): Safe transition to appropriate next level of care at discharge, Engage patient in therapeutic  group addressing interpersonal concerns.  Short Term Goals: Engage patient in aftercare planning with referrals and resources  Therapeutic Interventions: Assess for all discharge needs, 1 to 1 time with Social worker, Explore available resources and support systems, Assess for adequacy in community support network, Educate family and significant other(s) on suicide prevention, Complete Psychosocial Assessment, Interpersonal group therapy.  Evaluation of Outcomes: Met   Progress in Treatment: Attending groups: No Participating in groups: No Taking medication as prescribed: Yes Toleration medication: Yes, no side effects reported at this time Family/Significant other contact made: Yes Patient understands diagnosis: No  Limited insight Discussing patient identified problems/goals with staff: Yes Medical problems stabilized or resolved: Yes Denies suicidal/homicidal ideation: Yes Issues/concerns per patient self-inventory: None Other: N/A  New problem(s) identified: None identified at this time.   New Short Term/Long Term Goal(s): None identified at this time.   Discharge Plan or Barriers:  Return home, follow up outpt  Reason for Continuation of Hospitalization: Mania Anxiety Medication stabilization   Estimated Length of Stay: 1-3 days  Attendees: Patient: 11/12/2016  8:41 AM  Physician: Ursula Alert, MD 11/12/2016  8:41 AM  Nursing: Desma Paganini, RN 11/12/2016  8:41 AM  RN Care Manager: Lars Pinks, RN 11/12/2016  8:41 AM  Social Worker: Ripley Fraise 11/12/2016  8:41 AM  Recreational Therapist: Marjette  11/12/2016  8:41 AM  Other: Norberto Sorenson 11/12/2016  8:41 AM  Other:  11/12/2016  8:41 AM    Scribe for Treatment Team:  Roque Lias LCSW 11/12/2016 8:41 AM

## 2016-11-12 NOTE — [facility] Group Notes (Signed)
[facility] Group Notes:  (Counselor/Nursing/MHT/Case Management/Adjunct)  11/12/2016 1:15PM  Type of Therapy:  Group Therapy  Participation Level:  Active  Participation Quality:  Appropriate  Affect:  Flat  Cognitive:  Oriented  Insight:  Improving  Engagement in Group:  Limited  Engagement in Therapy:  Limited  Modes of Intervention:  Discussion, Exploration and Socialization  Summary of Progress/Problems: The topic for group was balance in life.  Pt participated in the discussion about when their life was in balance and out of balance and how this feels.  Pt discussed ways to get back in balance and short term goals they can work on to get where they want to be. In and out several times, per usual.  Engaged when present.  "I know Im more balanced because I am attuned to my physical sensations.  I am also working on setting better boundaries with others, and knowing my capacity for helping others.  And I am working on acceptance as well."  Pressured, tangential but redirectable.  Identified her faith and faith community as the things that help her find balance.  Tracy Russo, Tracy Russo 11/12/2016 3:52 PM

## 2016-11-12 NOTE — Progress Notes (Signed)
[facility] Group Notes:  (Nursing/MHT/Case Management/Adjunct)  Date:  11/12/2016  Time:  8:59 PM  Type of Therapy:  Psychoeducational Skills  Participation Level:  Active  Participation Quality:  Appropriate  Affect:  Appropriate  Cognitive:  Appropriate  Insight:  Improving  Engagement in Group:  Improving  Modes of Intervention:  Education  Summary of Progress/Problems: The patient stated in group that she had a good day overall ("balanced day") and that she was feeling better. She states that she interested in being discharged soon. Her goal for tomorrow is to continue to remain positive and to "be patient".   Hazle CocaGOODMAN, Ari Bernabei S 11/12/2016, 8:59 PM

## 2016-11-12 NOTE — Plan of Care (Signed)
Problem: Education: Goal: Ability to state activities that reduce stress will improve Outcome: Progressing Nurse discussed anxiety/coping skills/depression with patient.

## 2016-11-12 NOTE — Progress Notes (Signed)
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  Patient seen and chart reviewed.Discussed patient with treatment team.  Pt today continues to be anxious and restless on and off. However , she is more redirectable , and is able to cope with her anxiety better. Pt however continues to need reassurance from staff often , is seen as intrusive in milieu. Pt reports mild nausea on and off . Pt provided symptomatic Rx for her nausea, will monitor closely. Continue to treat,     Principal Problem: Bipolar disorder, current episode manic, severe with psychotic features [facility]) Diagnosis:   Patient Active Problem List   Diagnosis Date Noted  . Bipolar disorder, current episode manic, severe with psychotic features (HCC) [F31.2] 11/03/2016  . Obsessive-compulsive disorder [F42.9]    Total Time spent with patient: 25 minutes  Past Psychiatric History: Please see H&P.   Past Medical History: hx of thyroid nodule and S/P surgery while in elementary school, denies any issues now. Family History:  Family History  Problem Relation Age of Onset  . Bipolar disorder Mother   . Hypertension Father    Family Psychiatric  History: mother has Bipolar do , did well on Li and stelazine for 15 yrs , had to stop it when her renal function became abnormal. Social History: Please see H&P.  History  Alcohol Use  . Yes    Comment: occasional drinker, less than 2 drinks per week     History  Drug use: Unknown    Social History   Social History  . Marital status: Single    Spouse name: N/A  . Number of children:  N/A  . Years of education: N/A   Social History Main Topics  . Smoking status: Never Smoker  . Smokeless tobacco: Never Used  . Alcohol use Yes     Comment: occasional drinker, less than 2 drinks per week  . Drug use: Unknown  . Sexual activity: Not Asked   Other Topics Concern  . None   Social History Narrative  . None   Additional Social History:                         Sleep: Fair   Appetite:  Fair  Current Medications: Current Facility-Administered Medications  Medication Dose Route Frequency Provider Last Rate Last Dose  . alum & mag hydroxide-simeth (MAALOX/MYLANTA) 200-200-20 MG/5ML suspension 30 mL  30 mL Oral Q4H PRN Beau Fanny, FNP      . docusate sodium (COLACE) capsule 100 mg  100 mg Oral TID Jomarie Longs, MD   100 mg at 11/12/16 1207  . gabapentin (NEURONTIN) capsule 300 mg  300 mg Oral TID PC & HS Kamon Fahr, MD   300 mg at 11/12/16 1207  . hydrOXYzine (ATARAX/VISTARIL) tablet 25 mg  25 mg Oral Q6H PRN Jomarie Longs, MD   25 mg at 11/09/16 1055  . ibuprofen (ADVIL,MOTRIN) tablet 600 mg  600 mg Oral  Q8H PRN Beau Fanny, FNP   600 mg at 11/12/16 9147  . Influenza vac split quadrivalent PF (FLUARIX) injection 0.5 mL  0.5 mL Intramuscular Tomorrow-1000 Oneta Rack, NP      . lidocaine (LIDODERM) 5 % 1 patch  1 patch Transdermal Q24H Jomarie Longs, MD   1 patch at 11/12/16 1035  . lithium carbonate capsule 300 mg  300 mg Oral TID WC Jomarie Longs, MD   300 mg at 11/12/16 1208  . LORazepam (ATIVAN) tablet 1 mg  1 mg Oral Q6H PRN Adonis Brook, NP   1 mg at 11/12/16 1320   Or  . LORazepam (ATIVAN) injection 1 mg  1 mg Intramuscular Q6H PRN Adonis Brook, NP   1 mg at 11/02/16 1451  . magnesium hydroxide (MILK OF MAGNESIA) suspension 30 mL  30 mL Oral Daily PRN Beau Fanny, FNP   30 mL at 11/06/16 2018  . neomycin-bacitracin-polymyxin (NEOSPORIN) ointment   Topical Daily Ellowyn Rieves, MD      . nicotine (NICODERM CQ - dosed in mg/24  hours) patch 21 mg  21 mg Transdermal Daily Beau Fanny, FNP   21 mg at 11/12/16 0755  . OLANZapine (ZYPREXA) tablet 10 mg  10 mg Oral TID PRN Jomarie Longs, MD   10 mg at 11/12/16 1021   Or  . OLANZapine (ZYPREXA) injection 10 mg  10 mg Intramuscular TID PRN Jomarie Longs, MD      . ondansetron (ZOFRAN) tablet 4 mg  4 mg Oral Q8H PRN Beau Fanny, FNP   4 mg at 11/12/16 1023  . phenylephrine-shark liver oil-mineral oil-petrolatum (PREPARATION H) rectal ointment 1 application  1 application Rectal TID Oneta Rack, NP   1 application at 11/10/16 1705  . propranolol (INDERAL) tablet 10 mg  10 mg Oral TID Jomarie Longs, MD   10 mg at 11/12/16 1208  . simethicone (MYLICON) chewable tablet 80 mg  80 mg Oral QID PRN Jomarie Longs, MD      . traZODone (DESYREL) tablet 150 mg  150 mg Oral QHS Kejuan Bekker, MD      . white petrolatum (VASELINE) gel   Topical PRN Beau Fanny, FNP      . ziprasidone (GEODON) capsule 100 mg  100 mg Oral Q supper Jomarie Longs, MD   100 mg at 11/11/16 1731  . ziprasidone (GEODON) capsule 80 mg  80 mg Oral Q breakfast Jomarie Longs, MD   80 mg at 11/12/16 8295    Lab Results:  No results found for this or any previous visit (from the past 48 hour(s)).  Blood Alcohol level:  Lab Results  Component Value Date   ETH <5 10/30/2016    Metabolic Disorder Labs: Lab Results  Component Value Date   HGBA1C 4.8 11/02/2016   MPG 91 11/02/2016   Lab Results  Component Value Date   PROLACTIN 26.5 (H) 11/02/2016   Lab Results  Component Value Date   CHOL 108 11/02/2016   TRIG 51 11/02/2016   HDL 35 (L) 11/02/2016   CHOLHDL 3.1 11/02/2016   VLDL 10 11/02/2016   LDLCALC 63 11/02/2016    Physical Findings: AIMS: Facial and Oral Movements Muscles of Facial Expression: None, normal Lips and Perioral Area: None, normal Jaw: None, normal Tongue: None, normal,Extremity Movements Upper (arms, wrists, hands, fingers): None, normal Lower (legs, knees,  ankles, toes): None, normal, Trunk Movements Neck, shoulders, hips: None, normal, Overall Severity Severity of abnormal movements (highest score from  questions above): None, normal Incapacitation due to abnormal movements: None, normal Patient's awareness of abnormal movements (rate only patient's report): No Awareness, Dental Status Current problems with teeth and/or dentures?: No Does patient usually wear dentures?: No  CIWA:    COWS:     Musculoskeletal: Strength & Muscle Tone: within normal limits Gait & Station: normal Patient leans: N/A  Psychiatric Specialty Exam: Physical Exam  Nursing note and vitals reviewed.   Review of Systems  Gastrointestinal: Positive for nausea.  Psychiatric/Behavioral: The patient is nervous/anxious.   All other systems reviewed and are negative.   Blood pressure 118/74, pulse 75, temperature 97.4 F (36.3 C), temperature source Oral, resp. rate 18, height 5\' 7"  (1.702 m), weight 62.6 kg (138 lb), last menstrual period 10/14/2016.Body mass index is 21.61 kg/m.  General Appearance: Casual  Eye Contact:  Fair  Speech:  Pressured  Volume:  Increased  Mood:  Anxious and Dysphoric improving  Affect:  Labile and Tearful  Thought Process:  Disorganized and Descriptions of Associations: Tangential is more redirectable, is more organized  Orientation:  Full (Time, Place, and Person)  Thought Content:  Paranoid Ideation and Rumination improving  Suicidal Thoughts:  No  Homicidal Thoughts:  No  Memory:  Immediate;   Fair Recent;   Fair Remote;   Fair  Judgement:  Fair  Insight:  Fair  Psychomotor Activity:  Restlessness  Concentration:  Concentration: Poor and Attention Span: Fair  Recall:  [employer]is[employer]Fair  Fund of Knowledge:  Fair  Language:  Fair  Akathisia:  No  Handed:  Right  AIMS (if indicated):     Assets:  Desire for Improvement  ADL's:  Intact  Cognition:  WNL  Sleep:  Number of Hours: 6.5   11/09/16: Spoke to mother again - gave update  about progress.  11/05/16 : Mother contacted Clinical research associatewriter - wanted an update about patients progress. Discussed that she is showing some progress on the medications . Also she wanted to know if Clinical research associatewriter received records from [place].    11/04/16 Reviewed records obtained from [place] -  [facility] - mental health facility : As per records pt was admitted there from another facility [facility]( [facility]) on November 17 th , with a manic episode . Pt at that time presented as delusional , with sexual disinhibition , high energy ) was started on olanzapine titrated up to 30 mg per day , aripiprazol 30 mg as well as Depakine ER 500 mg , Lorazepam 5 mg po tid , etumina qhs as well as received IM Clopixol Acufase 50 mg prior to transfer to [place] with her parents on November 28 th.    Treatment plan and summary: Patient seen as restless and anxious often, continues to be pressured and hyperactive, although progressing.    Bipolar disorder, current episode manic, severe with psychotic features (HCC) unstable- improving   Will continue today 11/12/16 plan as below except where it is noted.  Daily contact with patient to assess and evaluate symptoms and progress in treatment and Medication management  Reviewed past medical records,treatment plan.   For Bipolar do : Will continue to taper off seroquel. Increased Geodon to 80 mg po daily and 100 mg po qpm with meals. Will continue Li 300 mg po tid with meals.  Li level on 11/08/16- subtherapeutic - 0.19. Next Li level on 11/13/16.  For Insomnia: Will discontinue seroquel - pt is on a higher dose of Geodon.  Will increase Trazodone to 150 mg po qhs .  For anxiety sx: Will continue Gabapentin 300 mg po qid.  For tachycardia: Propranolol 10 mg po tid.  For constipation: Increased Colace to 100 mg po tid .  For anxiety/agitation: Will continue PRN medication as per agitation protocol.  Will continue to monitor vitals ,medication  compliance and treatment side effects while patient is here.   Will monitor for medical issues as well as call consult as needed.   Reviewed labs lipid panel - wnl, hba1c- wnl, pl - slightly elevated , tsh - wnl.  I have reviewed EKG for qtc - wnl.   CSW will continue working on disposition.   Patient to participate in therapeutic milieu .      Theo Krumholz, MD 11/12/2016, 1:36 PM

## 2016-11-12 NOTE — Progress Notes (Signed)
Nursing Progress Note: 7p-7a D: Pt currently presents with a hyperactive/manic/euthymic affect and behavior. Pt states "I am feeling good today and I am getting on good. I hope to get discharged soon. I don't even know why I am still here." Pt reports great sleep with current medication regimen.   A: Pt provided with medications per providers orders. Pt's labs and vitals were monitored throughout the night. Pt supported emotionally and encouraged to express concerns and questions. Pt educated on medications.  R: Pt's safety ensured with 15 minute and environmental checks. Pt currently denies SI/HI/Self Harm and A/V hallucinations. Pt verbally contracts to seek staff if SI/HI or A/VH occurs and to consult with staff before acting on any harmful thoughts. Will continue monitor.

## 2016-11-12 NOTE — Progress Notes (Signed)
D:  Patient's self inventory sheet, patient sleeps good, no sleep medication given.  Fair appetite, high energy level, good concentration.  Denied depression and hopeless.  Rated depression 3.  Denied withdrawals.  Denied SI.  Denied physical problems.  Denied pain.  Goal is to maintain calm vibes.  Plans to do breathing exercises.  Plans to talk to MD about medications.  Does have discharge plans. A:  Medications administered per MD orders.  Emotional support and encouragement given patient. R:  Patient denied SI and HI while talking to nurse this morning, contracts for safety.  Denied A/V hallucinations.  Safety maintained with 15 minute checks.

## 2016-11-13 LAB — LITHIUM LEVEL: Lithium Lvl: 0.38 mmol/L — ABNORMAL LOW (ref 0.60–1.20)

## 2016-11-13 MED ORDER — LITHIUM CARBONATE 300 MG PO CAPS
600.0000 mg | ORAL_CAPSULE | Freq: Two times a day (BID) | ORAL | Status: DC
  Administered 2016-11-13 – 2016-11-15 (×4): 600 mg via ORAL
  Filled 2016-11-13 (×8): qty 2

## 2016-11-13 NOTE — Progress Notes (Signed)
Patient ID: Tracy Russo, female   DOB: 12/17/1990, 25 y.o.   MRN: [id number] D: Client in bed most of this shift up for medications administration and to get a snack. "I'm tired" "I want to take a shower, but I'll wait until in the morning" "do have any chocolate milk" A: Writer provided emotional support, get snacks for client. Medications reviewed, administered as ordered. Staff will monitor q1115min for safety. R: Client is safe on the unit.

## 2016-11-13 NOTE — Progress Notes (Signed)
[facility] MD Progress Note  11/13/2016 12:17 PM Yola Paradiso  MRN:  [id number] Subjective:  Pt states " I am still focussed on my bottom a lot, I think it is because I do not have any friends with me. 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  Patient seen and chart reviewed.Discussed patient with treatment team.  Pt today continues to be seen as restless , anxious , pressured , going off topic and being tangential. Pt starts out talking about how focussed she is on her "bottom" and then starts talking about other unrelated topics. Pt continues to need redirection. Pt has Li level still subtherapeutic - will readjust dose today. Pt advised that her medications will need to be readjusted and that she needs to be observed on the unit. Per staff - pt continues to be intrusive and impulsive and pressured - continues to need PRN medications on the unit like zyprexa 10 mg po .     Principal Problem: Bipolar disorder, current episode manic, severe with psychotic features (HCC) Diagnosis:   Patient Active Problem List   Diagnosis Date Noted  . Bipolar disorder, current episode manic, severe with psychotic features (HCC) [F31.2] 11/03/2016  . Obsessive-compulsive disorder [F42.9]    Total Time spent with patient: 25 minutes  Past Psychiatric History: Please see H&P.   Past Medical History: hx of thyroid nodule and S/P surgery while in elementary school, denies any issues now. Family History:  Family History  Problem Relation Age of Onset  . Bipolar disorder Mother   . Hypertension Father    Family Psychiatric  History: mother has Bipolar do , did well on Li and stelazine for 15 yrs , had to stop it when her renal function became abnormal. Social History: Please see H&P.  History  Alcohol Use  . Yes    Comment: occasional drinker,  less than 2 drinks per week     History  Drug use: Unknown    Social History   Social History  . Marital status: Single    Spouse name: N/A  . Number of children: N/A  . Years of education: N/A   Social History Main Topics  . Smoking status: Never Smoker  . Smokeless tobacco: Never Used  . Alcohol use Yes     Comment: occasional drinker, less than 2 drinks per week  . Drug use: Unknown  . Sexual activity: Not Asked   Other Topics Concern  . None   Social History Narrative  . None   Additional Social History:                         Sleep: Fair   Appetite:  Fair  Current Medications: Current Facility-Administered Medications  Medication Dose Route Frequency Provider Last Rate Last Dose  . alum & mag hydroxide-simeth (MAALOX/MYLANTA) 200-200-20 MG/5ML suspension 30 mL  30 mL Oral Q4H PRN Beau Fanny, FNP      . docusate sodium (COLACE) capsule 100 mg  100 mg Oral TID Jomarie Longs, MD   100 mg at 11/13/16 1202  . gabapentin (NEURONTIN) capsule 300 mg  300 mg Oral TID PC & HS Dominiqua Cooner, MD   300 mg at 11/13/16 1201  . hydrOXYzine (ATARAX/VISTARIL) tablet 25 mg  25 mg Oral Q6H PRN Jomarie LongsSaramma Areej Tayler, MD   25 mg at 11/09/16 1055  . ibuprofen (ADVIL,MOTRIN) tablet 600 mg  600 mg Oral Q8H PRN Beau FannyJohn C Withrow, FNP   600 mg at 11/12/16 [id number]  . Influenza vac split quadrivalent PF (FLUARIX) injection 0.5 mL  0.5 mL Intramuscular Tomorrow-1000 Oneta Rackanika N Lewis, NP      . lidocaine (LIDODERM) 5 % 1 patch  1 patch Transdermal Q24H Jomarie LongsSaramma Keniesha Adderly, MD   1 patch at 11/13/16 1100  . lithium carbonate capsule 600 mg  600 mg Oral BID WC Merwyn Hodapp, MD      . LORazepam (ATIVAN) tablet 1 mg  1 mg Oral Q6H PRN Adonis BrookSheila Agustin, NP   1 mg at 11/12/16 1320   Or  . LORazepam (ATIVAN) injection 1 mg  1 mg Intramuscular Q6H PRN Adonis BrookSheila Agustin, NP   1 mg at 11/02/16 1451  . magnesium hydroxide (MILK OF MAGNESIA) suspension 30 mL  30 mL Oral Daily PRN Beau FannyJohn C Withrow, FNP   30 mL at  11/06/16 2018  . neomycin-bacitracin-polymyxin (NEOSPORIN) ointment   Topical Daily Jeroline Wolbert, MD      . nicotine (NICODERM CQ - dosed in mg/24 hours) patch 21 mg  21 mg Transdermal Daily Beau FannyJohn C Withrow, FNP   21 mg at 11/12/16 0755  . OLANZapine (ZYPREXA) tablet 10 mg  10 mg Oral TID PRN Jomarie LongsSaramma Serenidy Waltz, MD   10 mg at 11/12/16 1021   Or  . OLANZapine (ZYPREXA) injection 10 mg  10 mg Intramuscular TID PRN Jomarie LongsSaramma Lottie Siska, MD      . ondansetron (ZOFRAN) tablet 4 mg  4 mg Oral Q8H PRN Beau FannyJohn C Withrow, FNP   4 mg at 11/12/16 1023  . phenylephrine-shark liver oil-mineral oil-petrolatum (PREPARATION H) rectal ointment 1 application  1 application Rectal TID Oneta Rackanika N Lewis, NP   1 application at 11/13/16 0805  . propranolol (INDERAL) tablet 10 mg  10 mg Oral TID Jomarie LongsSaramma Capri Veals, MD   10 mg at 11/13/16 1201  . simethicone (MYLICON) chewable tablet 80 mg  80 mg Oral QID PRN Jomarie LongsSaramma Xyler Terpening, MD      . traZODone (DESYREL) tablet 150 mg  150 mg Oral QHS Jomarie LongsSaramma Analilia Geddis, MD   150 mg at 11/12/16 2046  . white petrolatum (VASELINE) gel   Topical PRN Beau FannyJohn C Withrow, FNP      . ziprasidone (GEODON) capsule 100 mg  100 mg Oral Q supper Jomarie LongsSaramma Shandreka Dante, MD   100 mg at 11/12/16 1702  . ziprasidone (GEODON) capsule 80 mg  80 mg Oral Q breakfast Jomarie LongsSaramma Vercie Pokorny, MD   80 mg at 11/13/16 [id number]    Lab Results:  Results for orders placed or performed during the hospital encounter of 10/31/16 (from the past 48 hour(s))  Lithium level     Status: Abnormal   Collection Time: 11/13/16  6:26 AM  Result Value Ref Range   Lithium Lvl 0.38 (L) 0.60 - 1.20 mmol/L    Comment: Performed at [facility]    Blood Alcohol level:  Lab Results  Component Value Date   [facility]    Metabolic Disorder Labs: Lab Results  Component Value Date   HGBA1C 4.8 11/02/2016   MPG 91 11/02/2016   Lab Results  Component Value Date   PROLACTIN 26.5 (H) 11/02/2016   Lab Results  Component Value Date   CHOL 108  11/02/2016   TRIG 51 11/02/2016   HDL 35 (L) 11/02/2016  CHOLHDL 3.1 11/02/2016   VLDL 10 11/02/2016   LDLCALC 63 11/02/2016    Physical Findings: AIMS: Facial and Oral Movements Muscles of Facial Expression: None, normal Lips and Perioral Area: None, normal Jaw: None, normal Tongue: None, normal,Extremity Movements Upper (arms, wrists, hands, fingers): None, normal Lower (legs, knees, ankles, toes): None, normal, Trunk Movements Neck, shoulders, hips: None, normal, Overall Severity Severity of abnormal movements (highest score from questions above): None, normal Incapacitation due to abnormal movements: None, normal Patient's awareness of abnormal movements (rate only patient's report): No Awareness, Dental Status Current problems with teeth and/or dentures?: No Does patient usually wear dentures?: No  CIWA:  CIWA-Ar Total: 2 COWS:  COWS Total Score: 2  Musculoskeletal: Strength & Muscle Tone: within normal limits Gait & Station: normal Patient leans: N/A  Psychiatric Specialty Exam: Physical Exam  Nursing note and vitals reviewed.   Review of Systems  Psychiatric/Behavioral: The patient is nervous/anxious.   All other systems reviewed and are negative.   Blood pressure 115/78, pulse 81, temperature 98.2 F (36.8 C), temperature source Oral, resp. rate 16, height 5\' 7"  (1.702 m), weight 62.6 kg (138 lb), last menstrual period 10/14/2016.Body mass index is 21.61 kg/m.  General Appearance: Casual  Eye Contact:  Fair  Speech:  Pressured  Volume:  Increased  Mood:  Anxious and Dysphoric improving  Affect:  Labile and Tearful  Thought Process:  Disorganized and Descriptions of Associations: Tangential is more redirectable, is more organized  Orientation:  Full (Time, Place, and Person)  Thought Content:  Paranoid Ideation and Rumination improving  Suicidal Thoughts:  No  Homicidal Thoughts:  No  Memory:  Immediate;   Fair Recent;   Fair Remote;   Fair  Judgement:   Fair  Insight:  Fair  Psychomotor Activity:  Restlessness  Concentration:  Concentration: Poor and Attention Span: Fair  Recall:  [employer] of Knowledge:  Fair  Language:  Fair  Akathisia:  No  Handed:  Right  AIMS (if indicated):     Assets:  Desire for Improvement  ADL's:  Intact  Cognition:  WNL  Sleep:  Number of Hours: 6.5   11/09/16: Spoke to mother again - gave update about progress.  11/05/16 : Mother contacted Clinical research associate - wanted an update about patients progress. Discussed that she is showing some progress on the medications . Also she wanted to know if Clinical research associate received records from [place].    11/04/16 Reviewed records obtained from [place] -  [facility] - mental health facility : As per records pt was admitted there from another facility [facility]) on November 17 th , with a manic episode . Pt at that time presented as delusional , with sexual disinhibition , high energy ) was started on olanzapine titrated up to 30 mg per day , aripiprazol 30 mg as well as Depakine ER 500 mg , Lorazepam 5 mg po tid , etumina qhs as well as received IM Clopixol Acufase 50 mg prior to transfer to [place] with her parents on November 28 th.    Treatment plan and summary: Patient seen as restless and pressured , will continue treatment. Li level is subtherapeutic - will increase dose today.    Bipolar disorder, current episode manic, severe with psychotic features (HCC) unstable- improving   Will continue today 11/13/16 plan as below except where it is noted.  Daily contact with patient to assess and evaluate symptoms and progress in treatment and Medication management  Reviewed past medical records,treatment  plan.   For Bipolar do : Will continue to taper off seroquel. Increased Geodon to 80 mg po daily and 100 mg po qpm with meals.Seroquel discontinued last night. Will increase Li to  600 mg po bid with meals.  Li level on 11/08/16- subtherapeutic - 0.19. Reviewed  Li level today  11/13/16.- subtherapeutic - 0.38  For Insomnia: Will continue Trazodone 150 mg po qhs .  For anxiety sx: Will continue Gabapentin 300 mg po qid.  For tachycardia: Propranolol 10 mg po tid.  For constipation: Increased Colace to 100 mg po tid .  For anxiety/agitation: Will continue PRN medication as per agitation protocol.  Will continue to monitor vitals ,medication compliance and treatment side effects while patient is here.   Will monitor for medical issues as well as call consult as needed.   Reviewed labs lipid panel - wnl, hba1c- wnl, pl - slightly elevated , tsh - wnl.  I have reviewed EKG for qtc - wnl.   CSW will continue working on disposition.   Patient to participate in therapeutic milieu .      Tamarion Haymond, MD 11/13/2016, 12:17 PM

## 2016-11-13 NOTE — [facility] Group Notes (Signed)
[facility] LCSW Group Therapy  11/13/2016  1:05 PM  Type of Therapy:  Group therapy  Participation Level:  Active  Participation Quality:  Attentive  Affect:  Flat  Cognitive:  Oriented  Insight:  Limited  Engagement in Therapy:  Limited  Modes of Intervention:  Discussion, Socialization  Summary of Progress/Problems:  Chaplain was here to lead a group on themes of hope and courage.  Invited.  In bed.  Stated she need to rest.  Did not join us.  [name], Nickolas Chalfin B 11/13/2016 1:12 PM

## 2016-11-13 NOTE — Progress Notes (Signed)
Recreation Therapy Notes  Date: 11/13/16 Time: 1000 Location: 500 Hall Dayroom  Group Topic: Stress Management  Goal Area(s) Addresses:  Patient will verbalize importance of using healthy stress management.  Patient will identify positive emotions associated with healthy stress management.   Behavioral Response: Engaged  Intervention: Stress Management  Activity :  Progressive Muscle Relaxation, Self Forgiveness Meditation.  LRT introduced the stress management techniques of progressive muscle relaxation and meditation.  LRT read a script to allow patients to engage in progressive muscle relaxation and played a meditation from the Calm app to allow patients the opportunity to engage in self forgiveness meditation.  Education:  Stress Management, Discharge Planning.   Education Outcome: Acknowledges edcuation/In group clarification offered/Needs additional education  Clinical Observations/Feedback: Pt stated she still felt stress in her neck after doing the progressive muscle relaxation.  Pt left during the self forgiveness meditation because she stated she struggles with self forgiveness.    Caroll RancherMarjette Kashae Carstens, LRT/CTRS      Caroll RancherLindsay, Rydan Gulyas A 11/13/2016 11:37 AM

## 2016-11-13 NOTE — [facility] Suicide Risk Assessment (Signed)
[facility] INPATIENT:  Family/Significant Other Suicide Prevention Education  Suicide Prevention Education:  Education Completed; No one has been identified by the patient as the family member/significant other with whom the patient will be residing, and identified as the person(s) who will aid the patient in the event of a mental health crisis (suicidal ideations/suicide attempt).  With written consent from the patient, the family member/significant other has been provided the following suicide prevention education, prior to the and/or following the discharge of the patient.  The suicide prevention education provided includes the following:  Suicide risk factors  Suicide prevention and interventions  National Suicide Hotline telephone number  [facility] assessment telephone number  [facility] Emergency Assistance 911  [facility] and/or Residential Mobile Crisis Unit telephone number  Request made of family/significant other to:  Remove weapons (e.g., guns, rifles, knives), all items previously/currently identified as safety concern.    Remove drugs/medications (over-the-counter, prescriptions, illicit drugs), all items previously/currently identified as a safety concern.  The family member/significant other verbalizes understanding of the suicide prevention education information provided.  The family member/significant other agrees to remove the items of safety concern listed above. The patient did not endorse SI at the time of admission, nor did the patient c/o SI during the stay here.  SPE not required.  Tracy RogueRodney Russo Tracy Russo 11/13/2016, 3:06 PM

## 2016-11-13 NOTE — Progress Notes (Signed)
DAR NOTE: Patient presents with anxious affect and  mood.  Denies pain, auditory and visual hallucinations.  Described energy level as normal and concentration as good.  Rates depression at 0, hopelessness at 3, and anxiety at 5.  Maintained on routine safety checks.  Medications given as prescribed.  Support and encouragement offered as needed.  Attended group and participated.  States goal for today is "to maintain my chill."  Patient observed socializing with peers in the dayroom.  Offered no complaint.

## 2016-11-14 MED ORDER — GABAPENTIN 400 MG PO CAPS
400.0000 mg | ORAL_CAPSULE | Freq: Three times a day (TID) | ORAL | Status: DC
  Administered 2016-11-14 – 2016-11-15 (×3): 400 mg via ORAL
  Filled 2016-11-14 (×11): qty 1

## 2016-11-14 NOTE — Progress Notes (Signed)
[facility] MD Progress Note  11/14/2016 12:57 PM Tracy Russo  MRN:  [id number] Subjective:  Pt states " I still have this energy that I am not sure what to do with, But I feel better."      Objective: Tracy Russo is a 23 y old CF , who is single , who was recently diagnosed with Bipolar do , was doing a teaching job in [place] , was brought in for behavioral changes to [facility].  Patient seen and chart reviewed.Discussed patient with treatment team.  Pt today seen as less manic, more redirectable. Pt continues to have high energy or restlessness often, but is able to cope with it better. Pt tolerating medications well. Per staff - pt is more calm, no disruptive issues noted. She continues to make use of PRN medications on and off.     Principal Problem: Bipolar disorder, current episode manic, severe with psychotic features (HCC) Diagnosis:   Patient Active Problem List   Diagnosis Date Noted  . Bipolar disorder, current episode manic, severe with psychotic features (HCC) [F31.2] 11/03/2016  . Obsessive-compulsive disorder [F42.9]    Total Time spent with patient: 25 minutes  Past Psychiatric History: Please see H&P.   Past Medical History: hx of thyroid nodule and S/P surgery while in elementary school, denies any issues now. Family History:  Family History  Problem Relation Age of Onset  . Bipolar disorder Mother   . Hypertension Father    Family Psychiatric  History: mother has Bipolar do , did well on Li and stelazine for 15 yrs , had to stop it when her renal function became abnormal. Social History: Please see H&P.  History  Alcohol Use  . Yes    Comment: occasional drinker, less than 2 drinks per week     History  Drug use: Unknown    Social History   Social History  . Marital status: Single    Spouse name: N/A  . Number of children: N/A  . Years of education: N/A   Social History Main Topics  . Smoking status: Never Smoker  . Smokeless tobacco: Never Used   . Alcohol use Yes     Comment: occasional drinker, less than 2 drinks per week  . Drug use: Unknown  . Sexual activity: Not Asked   Other Topics Concern  . None   Social History Narrative  . None   Additional Social History:                         Sleep: Fair   Appetite:  Fair  Current Medications: Current Facility-Administered Medications  Medication Dose Route Frequency Provider Last Rate Last Dose  . alum & mag hydroxide-simeth (MAALOX/MYLANTA) 200-200-20 MG/5ML suspension 30 mL  30 mL Oral Q4H PRN Beau Fanny, FNP      . docusate sodium (COLACE) capsule 100 mg  100 mg Oral TID Jomarie Longs, MD   100 mg at 11/14/16 1208  . gabapentin (NEURONTIN) capsule 300 mg  300 mg Oral TID PC & HS Jomarie Longs, MD   300 mg at 11/14/16 1208  . hydrOXYzine (ATARAX/VISTARIL) tablet 25 mg  25 mg Oral Q6H PRN Jomarie Longs, MD   25 mg at 11/14/16 0239  . ibuprofen (ADVIL,MOTRIN) tablet 600 mg  600 mg Oral Q8H PRN Beau Fanny, FNP   600 mg at 11/12/16 4098  . Influenza vac split quadrivalent PF (FLUARIX) injection 0.5 mL  0.5 mL Intramuscular Tomorrow-1000 [name]  Charma IgoN Lewis, NP      . lidocaine (LIDODERM) 5 % 1 patch  1 patch Transdermal Q24H Jomarie LongsSaramma Mical Kicklighter, MD   1 patch at 11/13/16 1100  . lithium carbonate capsule 600 mg  600 mg Oral BID WC Jomarie LongsSaramma Chalene Treu, MD   600 mg at 11/14/16 0749  . LORazepam (ATIVAN) tablet 1 mg  1 mg Oral Q6H PRN Adonis BrookSheila Agustin, NP   1 mg at 11/12/16 1320   Or  . LORazepam (ATIVAN) injection 1 mg  1 mg Intramuscular Q6H PRN Adonis BrookSheila Agustin, NP   1 mg at 11/02/16 1451  . magnesium hydroxide (MILK OF MAGNESIA) suspension 30 mL  30 mL Oral Daily PRN Beau FannyJohn C Withrow, FNP   30 mL at 11/06/16 2018  . neomycin-bacitracin-polymyxin (NEOSPORIN) ointment   Topical Daily Priscille Shadduck, MD      . nicotine (NICODERM CQ - dosed in mg/24 hours) patch 21 mg  21 mg Transdermal Daily Beau FannyJohn C Withrow, FNP   21 mg at 11/12/16 0755  . OLANZapine (ZYPREXA) tablet 10 mg  10  mg Oral TID PRN Jomarie LongsSaramma Shaylan Tutton, MD   10 mg at 11/12/16 1021   Or  . OLANZapine (ZYPREXA) injection 10 mg  10 mg Intramuscular TID PRN Jomarie LongsSaramma Yazir Koerber, MD      . ondansetron (ZOFRAN) tablet 4 mg  4 mg Oral Q8H PRN Beau FannyJohn C Withrow, FNP   4 mg at 11/12/16 1023  . phenylephrine-shark liver oil-mineral oil-petrolatum (PREPARATION H) rectal ointment 1 application  1 application Rectal TID Oneta Rackanika N Lewis, NP   1 application at 11/13/16 0805  . propranolol (INDERAL) tablet 10 mg  10 mg Oral TID Jomarie LongsSaramma Rorey Bisson, MD   10 mg at 11/14/16 1208  . simethicone (MYLICON) chewable tablet 80 mg  80 mg Oral QID PRN Jomarie LongsSaramma Orva Gwaltney, MD   80 mg at 11/14/16 0749  . traZODone (DESYREL) tablet 150 mg  150 mg Oral QHS Jomarie LongsSaramma Khoen Genet, MD   150 mg at 11/13/16 2136  . white petrolatum (VASELINE) gel   Topical PRN Beau FannyJohn C Withrow, FNP      . ziprasidone (GEODON) capsule 100 mg  100 mg Oral Q supper Jomarie LongsSaramma Duke Weisensel, MD   100 mg at 11/13/16 1728  . ziprasidone (GEODON) capsule 80 mg  80 mg Oral Q breakfast Jomarie LongsSaramma Javius Sylla, MD   80 mg at 11/14/16 [id number]    Lab Results:  Results for orders placed or performed during the hospital encounter of 10/31/16 (from the past 48 hour(s))  Lithium level     Status: Abnormal   Collection Time: 11/13/16  6:26 AM  Result Value Ref Range   Lithium Lvl 0.38 (L) 0.60 - 1.20 mmol/L    Comment: [facility]    Blood Alcohol level:  Lab Results  Component Value Date   [facility] <5 10/30/2016    Metabolic Disorder Labs: Lab Results  Component Value Date   HGBA1C 4.8 11/02/2016   MPG 91 11/02/2016   Lab Results  Component Value Date   PROLACTIN 26.5 (H) 11/02/2016   Lab Results  Component Value Date   CHOL 108 11/02/2016   TRIG 51 11/02/2016   HDL 35 (L) 11/02/2016   CHOLHDL 3.1 11/02/2016   VLDL 10 11/02/2016   LDLCALC 63 11/02/2016    Physical Findings: AIMS: Facial and Oral Movements Muscles of Facial Expression: None, normal Lips and Perioral Area:  None, normal Jaw: None, normal Tongue: None, normal,Extremity Movements Upper (arms, wrists, hands, fingers): None, normal  Lower (legs, knees, ankles, toes): None, normal, Trunk Movements Neck, shoulders, hips: None, normal, Overall Severity Severity of abnormal movements (highest score from questions above): None, normal Incapacitation due to abnormal movements: None, normal Patient's awareness of abnormal movements (rate only patient's report): No Awareness, Dental Status Current problems with teeth and/or dentures?: No Does patient usually wear dentures?: No  CIWA:  CIWA-Ar Total: 2 COWS:  COWS Total Score: 2  Musculoskeletal: Strength & Muscle Tone: within normal limits Gait & Station: normal Patient leans: N/A  Psychiatric Specialty Exam: Physical Exam  Nursing note and vitals reviewed.   Review of Systems  Psychiatric/Behavioral: The patient is nervous/anxious.   All other systems reviewed and are negative.   Blood pressure 106/60, pulse 75, temperature 98.2 F (36.8 C), resp. rate 16, height 5\' 7"  (1.702 m), weight 62.6 kg (138 lb), last menstrual period 10/14/2016.Body mass index is 21.61 kg/m.  General Appearance: Casual  Eye Contact:  Fair  Speech:  Pressured improving  Volume:  Increased  Mood:  Anxious and Dysphoric improving  Affect:  Labile  Thought Process:  Goal Directed and Descriptions of Associations: Circumstantial is more redirectable, is more organized  Orientation:  Full (Time, Place, and Person)  Thought Content:  Rumination improving  Suicidal Thoughts:  No  Homicidal Thoughts:  No  Memory:  Immediate;   Fair Recent;   Fair Remote;   Fair  Judgement:  Fair  Insight:  Fair  Psychomotor Activity:  Restlessness  Concentration:  Concentration: Fair and Attention Span: Fair  Recall:  [employer] of Knowledge:  Fair  Language:  Fair  Akathisia:  No  Handed:  Right  AIMS (if indicated):     Assets:  Desire for Improvement  ADL's:  Intact   Cognition:  WNL  Sleep:  Number of Hours: 2.5   11/09/16: Spoke to mother again - gave update about progress.  11/05/16 : Mother contacted Clinical research associate - wanted an update about patients progress. Discussed that she is showing some progress on the medications . Also she wanted to know if Clinical research associate received records from [place].    11/04/16 Reviewed records obtained from [place] -  [facility] - mental health facility : As per records pt was admitted there from another facility [facility] on November 17 th , with a manic episode . Pt at that time presented as delusional , with sexual disinhibition , high energy ) was started on olanzapine titrated up to 30 mg per day , aripiprazol 30 mg as well as Depakine ER 500 mg , Lorazepam 5 mg po tid , etumina qhs as well as received IM Clopixol Acufase 50 mg prior to transfer to [place] with her parents on November 28 th.    Treatment plan and summary: Patient today seen as less restless and less manic , requires PRN medications for anxiety on and off - continue to support.    Bipolar disorder, current episode manic, severe with psychotic features (HCC) unstable- improving   Will continue today 11/14/16 plan as below except where it is noted.  Daily contact with patient to assess and evaluate symptoms and progress in treatment and Medication management  Reviewed past medical records,treatment plan.   For Bipolar do : Will continue to taper off seroquel. Increased Geodon to 80 mg po daily and 100 mg po qpm with meals.Seroquel discontinued last night. Will increase Li to  600 mg po bid with meals.  Li level on 11/08/16- subtherapeutic - 0.19. Reviewed Li level  today  11/13/16.- subtherapeutic - 0.38 Next Li level on 11/17/16.  For Insomnia: Will continue Trazodone 150 mg po qhs .  For anxiety sx: Will increase Gabapentin 400 mg po qid.  For tachycardia: Propranolol 10 mg po tid.  For constipation: Increased Colace to 100 mg  po tid .  For anxiety/agitation: Will continue PRN medication as per agitation protocol.  Will continue to monitor vitals ,medication compliance and treatment side effects while patient is here.   Will monitor for medical issues as well as call consult as needed.   Reviewed labs lipid panel - wnl, hba1c- wnl, pl - slightly elevated , tsh - wnl.  I have reviewed EKG for qtc - wnl.   CSW will continue working on disposition.   Patient to participate in therapeutic milieu .      Avanish Cerullo, MD 11/14/2016, 12:57 PM

## 2016-11-14 NOTE — Progress Notes (Signed)
Adult Psychoeducational Group Note  Date:  11/14/2016 Time:  8:40 PM  Group Topic/Focus:  Wrap-Up Group:   The focus of this group is to help patients review their daily goal of treatment and discuss progress on daily workbooks.   Participation Level:  Active  Participation Quality:  Appropriate  Affect:  Appropriate  Cognitive:  Appropriate  Insight: Appropriate  Engagement in Group:  Engaged  Modes of Intervention:  Discussion  Additional Comments: The patient expressed that she attend groups about taking care of yourself.The patient also said she rates today a 8. Octavio Mannshigpen, [name] 11/14/2016, 8:40 PM

## 2016-11-14 NOTE — [facility] Group Notes (Signed)
[facility] Group Notes:  (Nursing--Healthy Coping Skills)  Date:  11/14/2016  Time:  0930 Type of Therapy:  Nurse Education  Participation Level:  Active  Participation Quality:  Appropriate, Attentive, Sharing and Supportive  Affect:  Anxious  Cognitive:  Alert and Oriented  Insight:  Improving  Engagement in Group:  Engaged  Modes of Intervention:  Discussion, Orientation and Support  Summary of Progress/Problems:  Tracy Russo, Tracy Russo 11/14/2016, 0930

## 2016-11-14 NOTE — Progress Notes (Signed)
D: Pt visible in milieu for majority of this shift. Denies SI, HI, AVH and pain when assessed. Endorsed anxiety related to d/c "I'm happy I'm getting d/c tomorrow but I'm just anxious about living at home with my parents, I have not live home in a while but I know that's where I need to be". Reports her depression 0/10, hopelessness 0/10 and anxiety 2/10. Pt's goal this shift "to stay more appropriate on the hall". Pt does appear more logical, less anxious and intrusive this shift.  A: All medications administered as prescribed with mouth checks to ensure compliance. Emotional support and availability provided to pt. Encouraged pt to voice needs, attend groups and comply with current medication regimen. Q 15 minutes checks continues on and off unit for safety.  R: Pt receptive to care. Cooperative with unit routines. Took her medications without issues. Denies adverse drug reactions. Attended unit groups. Verbally redirectable this shift. Denies concerns at this time. POC continues for safety and mood stability.

## 2016-11-14 NOTE — [facility] Group Notes (Signed)
Adult Therapy Group Note  Date:  11/14/2016  Time: 11:15AM-12:00PM  Group Topic/Focus: Unhealthy vs Healthy Coping Techniques  Building Self Esteem:    The focus of this group was to determine what unhealthy coping techniques typically are used or and what healthy coping techniques would be helpful in coping with fears related to the upcoming holidays.  An exercise was used to identify patients' fears about the upcoming holidays.   Patients were guided in becoming aware of the differences between healthy and unhealthy coping techniques.  Participation Level:  Active  Participation Quality:  Attentive, Sharing and Supportive  Affect:  Blunted  Cognitive:  Appropriate  Insight: Good  Engagement in Group:  Engaged  Modes of Intervention:  Exercise, Discussion and Support  Additional Comments:  The patient expressed a fear he/she has about the upcoming holidays is spending a lot of time with family.  She intends to spend the holidays with her parents watching TV.  Sometimes she is lonely during the holidays, which worries her somewhat.  Ambrose MantleMareida Grossman-Orr, LCSW 11/14/2016   1:40 PM

## 2016-11-15 MED ORDER — ZIPRASIDONE HCL 80 MG PO CAPS: 80.0000 mg | ORAL_CAPSULE | Freq: Every day | ORAL | 0 refills | Status: DC

## 2016-11-15 MED ORDER — NICOTINE 21 MG/24HR TD PT24: 21.0000 mg | MEDICATED_PATCH | Freq: Every day | TRANSDERMAL | 0 refills | Status: DC

## 2016-11-15 MED ORDER — PROPRANOLOL HCL 10 MG PO TABS: 10.0000 mg | ORAL_TABLET | Freq: Three times a day (TID) | ORAL | 0 refills | Status: DC

## 2016-11-15 MED ORDER — HYDROXYZINE HCL 25 MG PO TABS: 25.0000 mg | ORAL_TABLET | Freq: Four times a day (QID) | ORAL | 0 refills | Status: DC | PRN

## 2016-11-15 MED ORDER — ZIPRASIDONE HCL 20 MG PO CAPS: 100.0000 mg | ORAL_CAPSULE | Freq: Every day | ORAL | 0 refills | Status: DC

## 2016-11-15 MED ORDER — GABAPENTIN 400 MG PO CAPS: 400.0000 mg | ORAL_CAPSULE | Freq: Three times a day (TID) | ORAL | 0 refills | Status: DC

## 2016-11-15 MED ORDER — LITHIUM CARBONATE 600 MG PO CAPS: 600.0000 mg | ORAL_CAPSULE | Freq: Two times a day (BID) | ORAL | 0 refills | Status: DC

## 2016-11-15 MED ORDER — TRAZODONE HCL 150 MG PO TABS: 150.0000 mg | ORAL_TABLET | Freq: Every day | ORAL | 0 refills | Status: DC

## 2016-11-15 NOTE — [facility] Group Notes (Signed)
[facility] Group Notes:  (Nursing/MHT/Case Management/Adjunct)  Date:  11/15/2016  Time:  11:14 AM  Type of Therapy:  patient self inventory group  Participation Level:  Active  Participation Quality:  Appropriate  Affect:  Appropriate  Cognitive:  Appropriate  Insight:  Appropriate  Engagement in Group:  Engaged  Modes of Intervention:  Discussion  Summary of Progress/Problems:  Claudette Wermuth M Tanav Orsak 11/15/2016, 11:14 AM 

## 2016-11-15 NOTE — Progress Notes (Signed)
  [facility] :  Will you be returning to the same living situation after discharge:  Yes,  home with family At discharge, do you have transportation home?: Yes,  parents Do you have the ability to pay for your medications: Yes,  no barriers  Release of information consent forms completed and in the chart;  Patient's signature needed at discharge.  Patient to Follow up at: Follow-up Information    [facility] Follow up on 11/27/2016.   Specialty:  Behavioral Health Why:  Friday at 10:00 Contact information: [address] [place] [address] [phone]        [facility] Follow up on 11/16/2016.   Why:  Monday @ 3:00 with Fleet Contrasachel. Contact information: [address]  [address]  [place]  [phone]          Next level of care provider has access to [facility] Link:no  Safety Planning and Suicide Prevention discussed: No.  Not applicable  Have you used any form of tobacco in the last 30 days? (Cigarettes, Smokeless Tobacco, Cigars, and/or Pipes): No  Has patient been referred to the Quitline?: N/A patient is not a smoker  Patient has been referred for addiction treatment: N/A  Lynnell ChadMareida J Grossman-Orr 11/15/2016, 1:49 PM

## 2016-11-15 NOTE — [facility] Group Notes (Signed)
[facility] Group Notes: (Clinical Social Work)   11/15/2016      Type of Therapy:  Group Therapy   Participation Level:  Did Not Attend despite MHT prompting   Ambrose MantleMareida Grossman-Orr, LCSW 11/15/2016, 12:43 PM

## 2016-11-15 NOTE — Progress Notes (Signed)
D: Pt denies SI/HI/AVH. Pt is pleasant and cooperative. Pt goal for today is to work on her discharge.  A: Pt was offered support and encouragement. Pt was given scheduled medications. Pt was encourage to attend groups. Q 15 minute checks were done for safety.  R:Pt attends groups and interacts well with peers and staff. Pt is taking medication. Pt has no complaints.Pt receptive to treatment and safety maintained on unit.

## 2016-11-15 NOTE — Progress Notes (Signed)
Patient ID: Tracy Russo, female   DOB: 03/01/1991, 25 y.o.   MRN: [id number]   Pt was discharged home with her parents, all discharge instructions were given to patient and parents as her parents are her support system. This writer went over all followup appointments, medications, and appointment for lab work. Pt's mother wanted to know if she could drive this Clinical research associatewriter informed family that patient should wait to drive and let her doctor in the community make that decision. Pt reported that her depression was a 0, her hopelessness was a 0, and her anxiety was a 3. Pt reported that her goal for today was to get discharged.

## 2016-11-15 NOTE — Discharge Summary (Signed)
Physician Discharge Summary Note  Patient:  Tracy Russo is an 25 y.o., female MRN:  [id number] DOB:  02/27/1991 Patient phone:  [phone] (home)  Patient address:   [address] [place] [address],  Total Time spent with patient: 30 minutes  Date of Admission:  10/31/2016 Date of Discharge: 11/15/2016  Reason for Admission:  Bizarre behavior  Principal Problem: Bipolar disorder, current episode manic, severe with psychotic features [facility](HCC) Discharge Diagnoses: Patient Active Problem List   Diagnosis Date Noted  . Bipolar disorder, current episode manic, severe with psychotic features (HCC) [F31.2] 11/03/2016    Past Psychiatric History: see HPI  Past Medical History: History reviewed. No pertinent past medical history. History reviewed. No pertinent surgical history. Family History:  Family History  Problem Relation Age of Onset  . Bipolar disorder Mother   . Hypertension Father    Family Psychiatric  History: see HPI Social History:  History  Alcohol Use  . Yes    Comment: occasional drinker, less than 2 drinks per week     History  Drug use: Unknown    Social History   Social History  . Marital status: Single    Spouse name: N/A  . Number of children: N/A  . Years of education: N/A   Social History Main Topics  . Smoking status: Never Smoker  . Smokeless tobacco: Never Used  . Alcohol use Yes     Comment: occasional drinker, less than 2 drinks per week  . Drug use: Unknown  . Sexual activity: Not Asked   Other Topics Concern  . None   Social History Narrative  . None    Hospital Course:  Tracy Russo is an 25 y.o. female who reported being brought into the ED by parents (mother and father) because of changes in behaviors.  Patient reported recently returning from [place] where she experienced a behavior change and diagnosed with Bipolar disorder by a doctor.    Tracy Russo was admitted for Bipolar disorder, current episode  manic, severe with psychotic features [facility](HCC) and crisis management.  Patient was treated with medications with their indications listed below in detail under Medication List.  Medical problems were identified and treated as needed.  Home medications were restarted as appropriate.  Improvement was monitored by observation and Tracy Russo daily report of symptom reduction.  Emotional and mental status was monitored by daily self inventory reports completed by Tracy Russo and clinical staff.  Patient reported continued improvement, denied any new concerns.  Patient had been compliant on medications and denied side effects.  Support and encouragement was provided.         Tracy Russo was evaluated by the treatment team for stability and plans for continued recovery upon discharge.  Patient was offered further treatment options upon discharge including Residential, Intensive Outpatient and Outpatient treatment. Patient will follow up with agency listed below for medication management and counseling.  Encouraged patient to maintain satisfactory support network and home environment.  Advised to adhere to medication compliance and outpatient treatment follow up.  Prescriptions provided.       Tracy Russo motivation was an integral factor for scheduling further treatment.  Employment, transportation, bed availability, health status, family support, and any pending legal issues were also considered during patient's hospital stay.  Upon completion of this admission the patient was both mentally and medically stable for discharge denying suicidal/homicidal ideation, auditory/visual/tactile hallucinations, delusional thoughts and paranoia.      Physical  Findings: AIMS: Facial and Oral Movements Muscles of Facial Expression: None, normal Lips and Perioral Area: None, normal Jaw: None, normal Tongue: None, normal,Extremity Movements Upper (arms, wrists, hands, fingers): None, normal Lower  (legs, knees, ankles, toes): None, normal, Trunk Movements Neck, shoulders, hips: None, normal, Overall Severity Severity of abnormal movements (highest score from questions above): None, normal Incapacitation due to abnormal movements: None, normal Patient's awareness of abnormal movements (rate only patient's report): No Awareness, Dental Status Current problems with teeth and/or dentures?: No Does patient usually wear dentures?: No  CIWA:  CIWA-Ar Total: 2 COWS:  COWS Total Score: 2  Musculoskeletal: Strength & Muscle Tone: within normal limits Gait & Station: normal Patient leans: N/A  Psychiatric Specialty Exam:  SEE MD SRA Physical Exam  ROS  Blood pressure 112/76, pulse (!) 102, temperature 98.2 F (36.8 C), resp. rate 18, height 5\' 7"  (1.702 m), weight 62.6 kg (138 lb), last menstrual period 10/14/2016.Body mass index is 21.61 kg/m.   Have you used any form of tobacco in the last 30 days? (Cigarettes, Smokeless Tobacco, Cigars, and/or Pipes): No  Has this patient used any form of tobacco in the last 30 days? (Cigarettes, Smokeless Tobacco, Cigars, and/or Pipes) Yes, N/A  Blood Alcohol level:  Lab Results  Component Value Date   ETH <5 10/30/2016    Metabolic Disorder Labs:  Lab Results  Component Value Date   HGBA1C 4.8 11/02/2016   MPG 91 11/02/2016   Lab Results  Component Value Date   PROLACTIN 26.5 (H) 11/02/2016   Lab Results  Component Value Date   CHOL 108 11/02/2016   TRIG 51 11/02/2016   HDL 35 (L) 11/02/2016   CHOLHDL 3.1 11/02/2016   VLDL 10 11/02/2016   LDLCALC 63 11/02/2016    See Psychiatric Specialty Exam and Suicide Risk Assessment completed by Attending Physician prior to discharge.  Discharge destination:  Home  Is patient on multiple antipsychotic therapies at discharge:  No   Has Patient had three or more failed trials of antipsychotic monotherapy by history:  No  Recommended Plan for Multiple Antipsychotic  Therapies: NA   Allergies as of 11/15/2016   No Known Allergies     Medication List    STOP taking these medications   ARIPiprazole 15 MG tablet Commonly known as:  ABILIFY   divalproex 500 MG DR tablet Commonly known as:  DEPAKOTE   LORazepam 0.5 MG tablet Commonly known as:  ATIVAN   OLANZapine 10 MG tablet Commonly known as:  ZYPREXA   OLANZapine 5 MG tablet Commonly known as:  ZYPREXA   PRESCRIPTION MEDICATION     TAKE these medications     Indication  gabapentin 400 MG capsule Commonly known as:  NEURONTIN Take 1 capsule (400 mg total) by mouth 4 (four) times daily - after meals and at bedtime.  Indication:  Agitation   hydrOXYzine 25 MG tablet Commonly known as:  ATARAX/VISTARIL Take 1 tablet (25 mg total) by mouth every 6 (six) hours as needed for anxiety.  Indication:  Anxiety Neurosis   lithium 600 MG capsule Take 1 capsule (600 mg total) by mouth 2 (two) times daily with a meal. Patient needs to have Lithium level drawn tomorrow morning 12/18 before the first dose is taken.  Indication:  mood stabilization   nicotine 21 mg/24hr patch Commonly known as:  NICODERM CQ - dosed in mg/24 hours Place 1 patch (21 mg total) onto the skin daily. Start taking on:  11/16/2016  Indication:  Nicotine  Addiction   propranolol 10 MG tablet Commonly known as:  INDERAL Take 1 tablet (10 mg total) by mouth 3 (three) times daily.  Indication:  High Blood Pressure Disorder   traZODone 150 MG tablet Commonly known as:  DESYREL Take 1 tablet (150 mg total) by mouth at bedtime.  Indication:  Trouble Sleeping   ziprasidone 20 MG capsule Commonly known as:  GEODON Take 5 capsules (100 mg total) by mouth daily with supper.  Indication:  Major Depressive Disorder   ziprasidone 80 MG capsule Commonly known as:  GEODON Take 1 capsule (80 mg total) by mouth daily with breakfast. Start taking on:  11/16/2016  Indication:  Schizophrenia      Follow-up Information     [facility] Follow up on 11/27/2016.   Specialty:  Behavioral Health Why:  Friday at 10:00 Contact information: [address] [place] [address] [phone]        [facility] Follow up on 11/16/2016.   Why:  Monday @ 3:00 with Fleet Contrasachel. Contact information: [address]  [address]  [place]  [phone]          Follow-up recommendations:  Activity:  as tol Diet:  as tol  Comments:  1.  Take all your medications as prescribed.   2.  Report any adverse side effects to outpatient provider.  Obtain Lithium level before taking dose in the morning.  12/18 3.  Patient instructed to not use alcohol or illegal drugs while on prescription medicines. 4.  In the event of worsening symptoms, instructed patient to call 911, the crisis hotline or go to nearest emergency room for evaluation of symptoms.  Signed: Lindwood QuaSheila May Crystina Borrayo, NP [facility] 11/15/2016, 10:16 AM

## 2016-11-15 NOTE — [facility] Suicide Risk Assessment (Signed)
[facility]   Principal Problem: Bipolar disorder, current episode manic, severe with psychotic features [facility](HCC) Discharge Diagnoses:  Patient Active Problem List   Diagnosis Date Noted  . Bipolar disorder, current episode manic, severe with psychotic features (HCC) [F31.2] 11/03/2016    Total Time spent with patient: 30 minutes  Musculoskeletal: Strength & Muscle Tone: within normal limits Gait & Station: normal Patient leans: N/A  Psychiatric Specialty Exam: Review of Systems  Psychiatric/Behavioral: Negative for depression, hallucinations and substance abuse.  All other systems reviewed and are negative.   Blood pressure 112/76, pulse (!) 102, temperature 98.2 F (36.8 C), resp. rate 18, height 5\' 7"  (1.702 m), weight 62.6 kg (138 lb), last menstrual period 10/14/2016.Body mass index is 21.61 kg/m.  General Appearance: Casual  Eye Contact::  Fair  Speech:  Clear and Coherent409  Volume:  Normal  Mood:  Euthymic  Affect:  Appropriate  Thought Process:  Goal Directed and Descriptions of Associations: Intact  Orientation:  Full (Time, Place, and Person)  Thought Content:  Logical  Suicidal Thoughts:  No  Homicidal Thoughts:  No  Memory:  Immediate;   Fair Recent;   Fair Remote;   Fair  Judgement:  Fair  Insight:  Fair  Psychomotor Activity:  Normal  Concentration:  Fair  Recall:  FiservFair  Fund of Knowledge:Fair  Language: Fair  Akathisia:  No  Handed:  Right  AIMS (if indicated):     Assets:  Desire for Improvement  Sleep:  Number of Hours: 6  Cognition: WNL  ADL's:  Intact   Mental Status Per Nursing Assessment::   On Admission:  NA  Demographic Factors:  Caucasian  Loss Factors: NA  Historical Factors: Impulsivity  Risk Reduction Factors:   Religious beliefs about death, Living with another person, especially a relative, Positive social support, Positive therapeutic relationship and Positive coping skills or problem solving  skills  Continued Clinical Symptoms:  Bipolar Disorder: continue treatment on an out patient basis  Cognitive Features That Contribute To Risk:  None    Suicide Risk:  Minimal: No identifiable suicidal ideation.  Patients presenting with no risk factors but with morbid ruminations; may be classified as minimal risk based on the severity of the depressive symptoms  Follow-up Information    [facility] Follow up on 11/27/2016.   Specialty:  Behavioral Health Why:  Friday at 10:00 Contact information: [address] [place] [address] [phone]        [facility] Follow up on 11/16/2016.   Why:  Monday @ 3:00 with Fleet Contrasachel. Contact information: [address]  [address]  [place]  [phone]          Plan Of Care/Follow-up recommendations:  Activity:  no restrictions Diet:  regular Tests:  Dierdre SearlesLi level to be drawn on 11/17/16 prior to morning dose of Lithium that day. Other:  none  Lakeisha Waldrop, MD 11/15/2016, 9:19 AM

## 2016-11-15 NOTE — Discharge Instructions (Signed)
Please obtain Lithium level at your outpatient provider's office before you take your first dose of Lithium in the morning.

## 2016-12-30 LAB — HM PAP SMEAR: HM Pap smear: NORMAL

## 2018-11-02 ENCOUNTER — Encounter: Payer: Self-pay | Admitting: Emergency Medicine

## 2018-11-02 DIAGNOSIS — F3174 Bipolar disorder, in full remission, most recent episode manic: Secondary | ICD-10-CM

## 2018-11-16 ENCOUNTER — Encounter: Payer: Self-pay | Admitting: Psychiatry

## 2018-11-16 ENCOUNTER — Ambulatory Visit: Payer: BLUE CROSS/BLUE SHIELD | Admitting: Psychiatry

## 2018-11-16 VITALS — BP 124/80 | HR 66

## 2018-11-16 DIAGNOSIS — F411 Generalized anxiety disorder: Secondary | ICD-10-CM | POA: Diagnosis not present

## 2018-11-16 DIAGNOSIS — F319 Bipolar disorder, unspecified: Secondary | ICD-10-CM | POA: Diagnosis not present

## 2018-11-16 DIAGNOSIS — Z79899 Other long term (current) drug therapy: Secondary | ICD-10-CM | POA: Diagnosis not present

## 2018-11-16 MED ORDER — ZIPRASIDONE HCL 60 MG PO CAPS: 60.0000 mg | ORAL_CAPSULE | Freq: Every day | ORAL | 1 refills | Status: DC

## 2018-11-16 MED ORDER — PROPRANOLOL HCL 10 MG PO TABS: 10.0000 mg | ORAL_TABLET | Freq: Two times a day (BID) | ORAL | 1 refills | Status: DC

## 2018-11-16 MED ORDER — ZIPRASIDONE HCL 20 MG PO CAPS: 20.0000 mg | ORAL_CAPSULE | Freq: Every morning | ORAL | 1 refills | Status: DC

## 2018-11-16 MED ORDER — LITHIUM CARBONATE 600 MG PO CAPS: 1200.0000 mg | ORAL_CAPSULE | Freq: Every day | ORAL | 1 refills | Status: DC

## 2018-11-16 MED ORDER — LITHIUM CARBONATE 300 MG PO CAPS: 300.0000 mg | ORAL_CAPSULE | ORAL | 1 refills | Status: DC

## 2018-11-16 MED ORDER — GABAPENTIN 400 MG PO CAPS: 400.0000 mg | ORAL_CAPSULE | Freq: Two times a day (BID) | ORAL | 1 refills | Status: DC

## 2018-11-16 MED ORDER — HYDROXYZINE HCL 25 MG PO TABS: 25.0000 mg | ORAL_TABLET | Freq: Four times a day (QID) | ORAL | 5 refills | Status: AC | PRN

## 2018-11-16 NOTE — Progress Notes (Signed)
Tracy Russo [id number] 07/16/1991 27 y.o.  Subjective:   Patient ID:  Tracy Russo is a 27 y.o. (DOB 11/09/1991) female.  Chief Complaint:  Chief Complaint  Patient presents with  . Follow-up    Anxiety, Mood Instability    HPI Tracy Russo presents to the office today for follow-up of mood and anxiety. She reports that she has been doing well overall. Reports that school has gone well and made all "A's." Reports new relationship is going well. Reports that she had some anxiety with meeting deadline for paper after procrastinating. Reports that she has been taking hydroxyzine prn anxiety. Reports that she wants to do some organizational tasks over her winter break. "I feel like I am not very good at decompressing." Reports that she gets distracted at times and will frequently check her phone. Has set restrictions for herself on her phone. She reports that her mood has been "pretty good" and reports that she has been "happier." Denies irritability. She reports excessive spending one month and then stopped. Denies any other impulsive behaviors and most activities are planned out. She reports adequate sleep. Reports that she has been having more difficulty waking up in the mornings. Has been trying to eat healthier. She reports that her energy fluctuates. Reports adequate motivation. Denies SI.   Past Psychiatric Medication Trials: Abilify-ineffective Zyprexa-ineffective Geodon-effective Depakote Lithium Gabapentin Hydroxyzine Propanolol Trazodone   Review of Systems:  Review of Systems  Musculoskeletal: Negative for gait problem.  Skin:       Reports eczema on her scalp.   Neurological: Negative for tremors.  Psychiatric/Behavioral:       Please refer to HPI    Medications: I have reviewed the patient's current medications.  Current Outpatient Medications  Medication Sig Dispense Refill  . gabapentin (NEURONTIN) 400 MG capsule Take 1 capsule (400 mg total) by  mouth 2 (two) times daily. 180 capsule 1  . hydrOXYzine (ATARAX/VISTARIL) 25 MG tablet Take 1 tablet (25 mg total) by mouth every 6 (six) hours as needed for anxiety. 120 tablet 5  . lithium 600 MG capsule Take 2 capsules (1,200 mg total) by mouth at bedtime. Patient needs to have Lithium level drawn tomorrow morning 12/18 before the first dose is taken. 180 capsule 1  . lithium carbonate 300 MG capsule Take 1 capsule (300 mg total) by mouth every morning. 90 capsule 1  . propranolol (INDERAL) 10 MG tablet Take 1 tablet (10 mg total) by mouth 2 (two) times daily. 180 tablet 1  . ziprasidone (GEODON) 20 MG capsule Take 1 capsule (20 mg total) by mouth every morning. 90 capsule 1  . ziprasidone (GEODON) 60 MG capsule Take 1 capsule (60 mg total) by mouth at bedtime. 90 capsule 1   No current facility-administered medications for this visit.     Medication Side Effects: None  Allergies: No Known Allergies  No past medical history on file.  Family History  Problem Relation Age of Onset  . Bipolar disorder Mother   . Skin cancer Mother   . Hypertension Father     Social History   Socioeconomic History  . Marital status: Single    Spouse name: Not on file  . Number of children: Not on file  . Years of education: Not on file  . Highest education level: Not on file  Occupational History  . Not on file  Social Needs  . Financial resource strain: Not on file  . Food insecurity:    Worry: Not  on file    Inability: Not on file  . Transportation needs:    Medical: Not on file    Non-medical: Not on file  Tobacco Use  . Smoking status: Never Smoker  . Smokeless tobacco: Never Used  Substance and Sexual Activity  . Alcohol use: Yes    Comment: occasional drinker, less than 2 drinks per week  . Drug use: Not on file  . Sexual activity: Not on file  Lifestyle  . Physical activity:    Days per week: Not on file    Minutes per session: Not on file  . Stress: Not on file   Relationships  . Social connections:    Talks on phone: Not on file    Gets together: Not on file    Attends religious service: Not on file    Active member of club or organization: Not on file    Attends meetings of clubs or organizations: Not on file    Relationship status: Not on file  . Intimate partner violence:    Fear of current or ex partner: Not on file    Emotionally abused: Not on file    Physically abused: Not on file    Forced sexual activity: Not on file  Other Topics Concern  . Not on file  Social History Narrative  . Not on file    Past Medical History, Surgical history, Social history, and Family history were reviewed and updated as appropriate.   Please see review of systems for further details on the patient's review from today.   Objective:   Physical Exam:  BP 124/80   Pulse 66   Physical Exam Constitutional:      General: She is not in acute distress.    Appearance: She is well-developed.  Musculoskeletal:        General: No deformity.  Neurological:     Mental Status: She is alert and oriented to person, place, and time.     Coordination: Coordination normal.  Psychiatric:        Mood and Affect: Mood is not anxious or depressed. Affect is not labile, blunt, angry or inappropriate.        Speech: Speech is not delayed or slurred.        Behavior: Behavior normal.        Thought Content: Thought content normal. Thought content does not include homicidal or suicidal ideation. Thought content does not include homicidal or suicidal plan.        Judgment: Judgment normal.     Comments: Insight intact. No auditory or visual hallucinations. No delusions.  Speech increased rate (consistent with baseline). Volume WNL.     Lab Review:     Component Value Date/Time   NA 137 10/30/2016 2144   K 3.5 10/30/2016 2144   CL 103 10/30/2016 2144   CO2 27 10/30/2016 2144   GLUCOSE 110 (H) 10/30/2016 2144   BUN 11 10/30/2016 2144   CREATININE 0.60  10/30/2016 2144   CALCIUM 8.6 (L) 10/30/2016 2144   PROT 6.5 10/30/2016 2144   ALBUMIN 3.8 10/30/2016 2144   AST 23 10/30/2016 2144   ALT 17 10/30/2016 2144   ALKPHOS 44 10/30/2016 2144   BILITOT 0.4 10/30/2016 2144   GFRNONAA >60 10/30/2016 2144   GFRAA >60 10/30/2016 2144       Component Value Date/Time   WBC 5.2 10/30/2016 2144   RBC 4.24 10/30/2016 2144   HGB 12.9 10/30/2016 2144   HCT 37.1 10/30/2016  2144   PLT 179 10/30/2016 2144   MCV 87.5 10/30/2016 2144   MCH 30.4 10/30/2016 2144   MCHC 34.8 10/30/2016 2144   RDW 12.9 10/30/2016 2144    Lithium Lvl  Date Value Ref Range Status  11/13/2016 0.38 (L) 0.60 - 1.20 mmol/L Final    Comment:    Performed at [facility]     No results found for: PHENYTOIN, PHENOBARB, VALPROATE, CBMZ   .res Assessment: Plan:   Continue Lithium, Geodon, and Gabapentin for mood stabilization.  Continue Hydroxyzine prn anxiety.  Continue Propranolol for anxiety and tremor.  Will order Lithium level and Creatinine levels.  Bipolar I disorder (HCC) - Chronic, stable - Plan: gabapentin (NEURONTIN) 400 MG capsule, lithium carbonate 300 MG capsule, lithium 600 MG capsule, ziprasidone (GEODON) 20 MG capsule  Anxiety state - Chronic, stable - Plan: hydrOXYzine (ATARAX/VISTARIL) 25 MG tablet, propranolol (INDERAL) 10 MG tablet  High risk medication use - Plan: Lithium level, Creatinine, serum  Please see After Visit Summary for patient specific instructions.  Future Appointments  Date Time Provider Department Center  05/11/2019  9:00 AM Corie Chiquito, PMHNP [facility] None    Orders Placed This Encounter  Procedures  . Lithium level  . Creatinine, serum      -------------------------------

## 2018-11-18 LAB — LITHIUM LEVEL: Lithium Lvl: 0.8 mmol/L (ref 0.6–1.2)

## 2018-11-18 LAB — CREATININE, SERUM: Creat: 0.76 mg/dL (ref 0.50–1.10)

## 2019-04-25 ENCOUNTER — Telehealth: Payer: Self-pay | Admitting: Psychiatry

## 2019-04-25 DIAGNOSIS — Z79899 Other long term (current) drug therapy: Secondary | ICD-10-CM

## 2019-04-25 NOTE — Telephone Encounter (Signed)
Pt left v-mail asking if she needs labs before next appt. and if so she would also like vitamin B checked as well.

## 2019-04-25 NOTE — Telephone Encounter (Signed)
Left pt. A detailed vm and asked to call back if she had any questions.

## 2019-05-04 ENCOUNTER — Telehealth: Payer: Self-pay

## 2019-05-04 NOTE — Telephone Encounter (Signed)
-----   Message from Corie Chiquito, PMHNP sent at 05/04/2019  2:55 PM EDT ----- Please let patient know that we received the results from some of her lab work.  Metabolic panel was within normal range.  Lithium level was 0.8.  Vitamin B12 results are still pending.

## 2019-05-06 LAB — BASIC METABOLIC PANEL
BUN: 9 mg/dL (ref 7–25)
CO2: 24 mmol/L (ref 20–32)
Calcium: 9.7 mg/dL (ref 8.6–10.2)
Chloride: 108 mmol/L (ref 98–110)
Creat: 0.77 mg/dL (ref 0.50–1.10)
Glucose, Bld: 88 mg/dL (ref 65–99)
Potassium: 3.6 mmol/L (ref 3.5–5.3)
Sodium: 140 mmol/L (ref 135–146)

## 2019-05-06 LAB — VITAMIN B12: Vitamin B-12: 443 pg/mL (ref 200–1100)

## 2019-05-06 LAB — LITHIUM LEVEL: Lithium Lvl: 0.8 mmol/L (ref 0.6–1.2)

## 2019-05-11 ENCOUNTER — Ambulatory Visit (INDEPENDENT_AMBULATORY_CARE_PROVIDER_SITE_OTHER): Payer: BC Managed Care – PPO | Admitting: Psychiatry

## 2019-05-11 ENCOUNTER — Other Ambulatory Visit: Payer: Self-pay

## 2019-05-11 ENCOUNTER — Encounter: Payer: Self-pay | Admitting: Psychiatry

## 2019-05-11 DIAGNOSIS — F3170 Bipolar disorder, currently in remission, most recent episode unspecified: Secondary | ICD-10-CM | POA: Diagnosis not present

## 2019-05-11 DIAGNOSIS — F411 Generalized anxiety disorder: Secondary | ICD-10-CM | POA: Diagnosis not present

## 2019-05-11 DIAGNOSIS — F319 Bipolar disorder, unspecified: Secondary | ICD-10-CM

## 2019-05-11 MED ORDER — GABAPENTIN 300 MG PO CAPS: 300.0000 mg | ORAL_CAPSULE | Freq: Two times a day (BID) | ORAL | 5 refills | Status: DC

## 2019-05-11 MED ORDER — ZIPRASIDONE HCL 60 MG PO CAPS: 60.0000 mg | ORAL_CAPSULE | Freq: Every day | ORAL | 1 refills | Status: DC

## 2019-05-11 MED ORDER — PROPRANOLOL HCL 10 MG PO TABS: 10.0000 mg | ORAL_TABLET | Freq: Two times a day (BID) | ORAL | 1 refills | Status: DC

## 2019-05-11 MED ORDER — ZIPRASIDONE HCL 20 MG PO CAPS: 20.0000 mg | ORAL_CAPSULE | Freq: Every morning | ORAL | 1 refills | Status: DC

## 2019-05-11 MED ORDER — LITHIUM CARBONATE 300 MG PO CAPS: 300.0000 mg | ORAL_CAPSULE | ORAL | 1 refills | Status: DC

## 2019-05-11 MED ORDER — LITHIUM CARBONATE 600 MG PO CAPS: 1200.0000 mg | ORAL_CAPSULE | Freq: Every day | ORAL | 1 refills | Status: DC

## 2019-05-11 NOTE — Progress Notes (Signed)
Tracy Russo [id number] 08/12/1991 28 y.o.  Subjective:   Patient ID:  Tracy Russo is a 28 y.o. (DOB 03/05/1991) female.  Chief Complaint:  Chief Complaint  Patient presents with  . Follow-up    h/o Bipolar D/O and Anxiety  . Other    low energy    HPI Tracy Russo presents to the office today for follow-up of mood and anxiety. She reports that most of her classes were online before the pandemic and that it initially did not affect her and then seemed to affect her more. She reports "I think my mood is fine." Denies depressed mood. She notices occ impatience at work. Has had some mild anxiety with house sitting and being at that house alone, especially after seeing some workers in the neighborhood. She reports that caffeine may have also contributed to some increased anxiety and racing thoughts. Reports oc catastrophic thinking. She reports that her energy continues to be lower than she would like. She reports that she feels tired throughout the day. Energy seems to correlate with activity level. She reports adequate sleep aside from at least one awakening a night to urinate. Estimates sleeping 8-9 hours of sleep on average. She reports that her appetite has been good. Reports that she was trying to lose weight and had gone to [employer]. Reports that she is trying to work on healthy lifestyle changes. Reports infrequent racing thoughts and that she occ notices some anxious thoughts. She reports some occ difficulty with concentration and distractibility. Denies SI.   Denies any recent manic s/s to include excessive energy, decreased sleep, or elevated mood. Denies any impulsive or risky behaviors.   Currently taking a summer class. 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 Temporarily working at [employer]. Continues to date the same person and reports that relationship seems to be going well.   Continues to see therapist  regularly.   Review of Systems:  Review of Systems  Musculoskeletal: Negative for gait problem.  Skin:       Recent skin infection  Neurological: Negative for tremors.  Psychiatric/Behavioral:       Please refer to HPI    Medications: I have reviewed the patient's current medications.  Current Outpatient Medications  Medication Sig Dispense Refill  . hydrOXYzine (VISTARIL) 25 MG capsule Take 25 mg by mouth 3 (three) times daily as needed.    . gabapentin (NEURONTIN) 300 MG capsule Take 1 capsule (300 mg total) by mouth 2 (two) times daily for 30 days. 60 capsule 5  . lithium 600 MG capsule Take 2 capsules (1,200 mg total) by mouth at bedtime. 180 capsule 1  . lithium carbonate 300 MG capsule Take 1 capsule (300 mg total) by mouth every morning. 90 capsule 1  . propranolol (INDERAL) 10 MG tablet Take 1 tablet (10 mg total) by mouth 2 (two) times daily. 180 tablet 1  . ziprasidone (GEODON) 20 MG capsule Take 1 capsule (20 mg total) by mouth every morning. 90 capsule 1  . ziprasidone (GEODON) 60 MG capsule Take 1 capsule (60 mg total) by mouth at bedtime. 90 capsule 1   No current facility-administered medications for this visit.     Medication Side Effects: None  Allergies: No Known Allergies  History reviewed. No pertinent past medical history.  Family History  Problem Relation Age of Onset  . Bipolar disorder Mother   . Skin cancer Mother   . Hypertension Father  Social History   Socioeconomic History  . Marital status: Single    Spouse name: Not on file  . Number of children: Not on file  . Years of education: Not on file  . Highest education level: Not on file  Occupational History  . Not on file  Social Needs  . Financial resource strain: Not on file  . Food insecurity    Worry: Not on file    Inability: Not on file  . Transportation needs    Medical: Not on file    Non-medical: Not on file  Tobacco Use  . Smoking status: Never Smoker  . Smokeless  tobacco: Never Used  Substance and Sexual Activity  . Alcohol use: Yes    Comment: occasional drinker, less than 2 drinks per week  . Drug use: Not on file  . Sexual activity: Not on file  Lifestyle  . Physical activity    Days per week: Not on file    Minutes per session: Not on file  . Stress: Not on file  Relationships  . Social Herbalist on phone: Not on file    Gets together: Not on file    Attends religious service: Not on file    Active member of club or organization: Not on file    Attends meetings of clubs or organizations: Not on file    Relationship status: Not on file  . Intimate partner violence    Fear of current or ex partner: Not on file    Emotionally abused: Not on file    Physically abused: Not on file    Forced sexual activity: Not on file  Other Topics Concern  . Not on file  Social History Narrative  . Not on file    Past Medical History, Surgical history, Social history, and Family history were reviewed and updated as appropriate.   Please see review of systems for further details on the patient's review from today.   Objective:   Physical Exam:  There were no vitals taken for this visit.  Physical Exam Constitutional:      General: She is not in acute distress.    Appearance: She is well-developed.  Musculoskeletal:        General: No deformity.  Neurological:     Mental Status: She is alert and oriented to person, place, and time.     Coordination: Coordination normal.  Psychiatric:        Attention and Perception: Attention and perception normal. She does not perceive auditory or visual hallucinations.        Mood and Affect: Mood normal. Mood is not anxious or depressed. Affect is not labile, blunt, angry or inappropriate.        Behavior: Behavior normal.        Thought Content: Thought content normal. Thought content does not include homicidal or suicidal ideation. Thought content does not include homicidal or suicidal plan.         Cognition and Memory: Cognition and memory normal.        Judgment: Judgment normal.     Comments: Insight intact. No delusions.  Speech: Rapid. Not pressured. Talkative.     Lab Review:     Component Value Date/Time   NA 140 05/02/2019 0708   K 3.6 05/02/2019 0708   CL 108 05/02/2019 0708   CO2 24 05/02/2019 0708   GLUCOSE 88 05/02/2019 0708   BUN 9 05/02/2019 0708   CREATININE 0.77 05/02/2019 0708  CALCIUM 9.7 05/02/2019 0708   PROT 6.5 10/30/2016 2144   ALBUMIN 3.8 10/30/2016 2144   AST 23 10/30/2016 2144   ALT 17 10/30/2016 2144   ALKPHOS 44 10/30/2016 2144   BILITOT 0.4 10/30/2016 2144   GFRNONAA >60 10/30/2016 2144   GFRAA >60 10/30/2016 2144       Component Value Date/Time   WBC 5.2 10/30/2016 2144   RBC 4.24 10/30/2016 2144   HGB 12.9 10/30/2016 2144   HCT 37.1 10/30/2016 2144   PLT 179 10/30/2016 2144   MCV 87.5 10/30/2016 2144   MCH 30.4 10/30/2016 2144   MCHC 34.8 10/30/2016 2144   RDW 12.9 10/30/2016 2144    Lithium Lvl  Date Value Ref Range Status  05/02/2019 0.8 0.6 - 1.2 mmol/L Final     No results found for: PHENYTOIN, PHENOBARB, VALPROATE, CBMZ   .res Assessment: Plan:   Discussed patient's concern regarding low energy.  Discussed that decrease in gabapentin may potentially improve low energy and would likely have minimal effect on her mood or anxiety since she has not had any worsening in signs and symptoms with dose decreases in the past.  Patient agrees to decrease in gabapentin. Reviewed most recent lab results with patient. Will decrease gabapentin to 300 mg twice daily to improve low energy. Continue lithium 300 mg in the morning and 1200 mg at bedtime for mood stabilization. Continue Geodon 20 mg p.o. every morning and 60 mg p.o. nightly for mood stabilization. Continue propanolol 10 mg twice daily for anxiety and tremor. Continue hydroxyzine as needed for anxiety.  Patient reports that she does not need a prescription at this  time. Recommend Vit D supplementation to possibly improve chronic low energy. Recommend continuing to see therapist. Patient to follow-up with this provider in 6 months or sooner if clinically indicated. Patient advised to contact office with any questions, adverse effects, or acute worsening in signs and symptoms.  DX: Bipolar I, currently in remission; Anxiety D/O NOS  Please see After Visit Summary for patient specific instructions.  Future Appointments  Date Time Provider Department Center  11/06/2019  9:30 AM Corie Chiquitoarter, Zahlia Deshazer, PMHNP [facility] None    No orders of the defined types were placed in this encounter.     -------------------------------

## 2019-05-11 NOTE — Assessment & Plan Note (Signed)
Continue lithium 300 mg in the morning and 1200 mg at bedtime for mood stabilization. Continue Geodon 20 mg p.o. every morning and 60 mg p.o. nightly for mood stabilization.

## 2019-09-02 ENCOUNTER — Other Ambulatory Visit: Payer: Self-pay | Admitting: Psychiatry

## 2019-09-02 DIAGNOSIS — F319 Bipolar disorder, unspecified: Secondary | ICD-10-CM

## 2019-11-06 ENCOUNTER — Other Ambulatory Visit: Payer: Self-pay

## 2019-11-06 ENCOUNTER — Encounter: Payer: Self-pay | Admitting: Psychiatry

## 2019-11-06 ENCOUNTER — Ambulatory Visit (INDEPENDENT_AMBULATORY_CARE_PROVIDER_SITE_OTHER): Payer: BC Managed Care – PPO | Admitting: Psychiatry

## 2019-11-06 VITALS — Wt 178.0 lb

## 2019-11-06 DIAGNOSIS — F319 Bipolar disorder, unspecified: Secondary | ICD-10-CM

## 2019-11-06 DIAGNOSIS — F411 Generalized anxiety disorder: Secondary | ICD-10-CM

## 2019-11-06 DIAGNOSIS — Z79899 Other long term (current) drug therapy: Secondary | ICD-10-CM

## 2019-11-06 MED ORDER — PROPRANOLOL HCL 10 MG PO TABS: 10.0000 mg | ORAL_TABLET | Freq: Two times a day (BID) | ORAL | 1 refills | Status: DC

## 2019-11-06 MED ORDER — ZIPRASIDONE HCL 20 MG PO CAPS: 20.0000 mg | ORAL_CAPSULE | Freq: Every morning | ORAL | 1 refills | Status: DC

## 2019-11-06 MED ORDER — GABAPENTIN 300 MG PO CAPS: 300.0000 mg | ORAL_CAPSULE | Freq: Two times a day (BID) | ORAL | 1 refills | Status: DC

## 2019-11-06 MED ORDER — LITHIUM CARBONATE 600 MG PO CAPS: 1200.0000 mg | ORAL_CAPSULE | Freq: Every day | ORAL | 1 refills | Status: DC

## 2019-11-06 MED ORDER — ZIPRASIDONE HCL 60 MG PO CAPS: 60.0000 mg | ORAL_CAPSULE | Freq: Every day | ORAL | 1 refills | Status: DC

## 2019-11-06 MED ORDER — LITHIUM CARBONATE 300 MG PO CAPS: 300.0000 mg | ORAL_CAPSULE | ORAL | 1 refills | Status: DC

## 2019-11-06 MED ORDER — HYDROXYZINE PAMOATE 25 MG PO CAPS: 25.0000 mg | ORAL_CAPSULE | Freq: Three times a day (TID) | ORAL | 2 refills | Status: DC | PRN

## 2019-11-06 NOTE — Progress Notes (Signed)
Geneal Huebert [id number] 08-31-1991 28 y.o.  Virtual Visit via Telephone Note  I connected with pt on 11/06/19 at  9:30 AM EST by telephone and verified that I am speaking with the correct person using two identifiers.   I discussed the limitations, risks, security and privacy concerns of performing an evaluation and management service by telephone and the availability of in person appointments. I also discussed with the patient that there may be a patient responsible charge related to this service. The patient expressed understanding and agreed to proceed.   I discussed the assessment and treatment plan with the patient. The patient was provided an opportunity to ask questions and all were answered. The patient agreed with the plan and demonstrated an understanding of the instructions.   The patient was advised to call back or seek an in-person evaluation if the symptoms worsen or if the condition fails to improve as anticipated.  I provided 25 minutes of non-face-to-face time during this encounter.  The patient was located at home.  The provider was located at [facility].   Corie Chiquito, PMHNP   Subjective:   Patient ID:  Tracy Russo is a 28 y.o. (DOB 11-06-1991) female.  Chief Complaint:  Chief Complaint  Patient presents with  . Anxiety  . Follow-up    Mood disturbance    HPI Tracy Russo presents for follow-up of mood, anxiety, and insomnia. "I feel like I have been doing really well." Just completed 3rd semester of graduate school and reports that she has made mostly A's. Has been spending time with her boyfriend. 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   Reports that she has had some anxiety with house sitting, particularly when house sitting for someone new. House sat for someone a week ago without any difficulties. Reports that she has used hydroxyzine prn for situational anxiety. Occ worry about money. Denies panic attacks.    She reports that her mood has been ok overall. She reports that her boyfriend has commented that she is occasionally "grumpy," typically in response to an identifiable trigger.  Denies significant depression. She reports that it can take some time to wake up and get going. Energy and motivation have been "normal." She reports that she is easily distracted at times, like watching TV or during 2 hour online classes. Able to focus on projects and assignments. She reports that her sleep has been adequate overall. Has been trying to eat breakfast. Reports that she has been doing some online shopping and it has not been excessive. Denies SI.   Reports losing wt prior to the pandemic and gained it back.   Continues to live at home. No longer working at [employer]. 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 Has been taking classes online.   Continues to see therapist 1-2 times a month.   Past Psychiatric Medication Trials: Abilify-ineffective Zyprexa-ineffective Geodon-effective Depakote Lithium Gabapentin Hydroxyzine Propanolol Trazodone   Review of Systems:  Review of Systems  Gastrointestinal: Negative.   Musculoskeletal: Negative for gait problem.  Neurological: Negative for tremors and headaches.       Denies TD  Psychiatric/Behavioral:       Please refer to HPI    Medications: I have reviewed the patient's current medications.  Current Outpatient Medications  Medication Sig Dispense Refill  . hydrOXYzine (VISTARIL) 25 MG capsule Take 1 capsule (25 mg total) by mouth 3 (three) times daily as needed. 90 capsule 2  .  gabapentin (NEURONTIN) 300 MG capsule Take 1 capsule (300 mg total) by mouth 2 (two) times daily. 180 capsule 1  . lithium 600 MG capsule Take 2 capsules (1,200 mg total) by mouth at bedtime. 180 capsule 1  . lithium carbonate 300 MG capsule Take 1 capsule (300 mg total) by mouth every morning. 90 capsule 1  . propranolol (INDERAL) 10 MG  tablet Take 1 tablet (10 mg total) by mouth 2 (two) times daily. 180 tablet 1  . ziprasidone (GEODON) 20 MG capsule Take 1 capsule (20 mg total) by mouth every morning. 90 capsule 1  . ziprasidone (GEODON) 60 MG capsule Take 1 capsule (60 mg total) by mouth at bedtime. 90 capsule 1   No current facility-administered medications for this visit.     Medication Side Effects: Other: Reports worsening memory (cannot recall details of TV shows, what happened the day before)  Allergies: No Known Allergies  History reviewed. No pertinent past medical history.  Family History  Problem Relation Age of Onset  . Bipolar disorder Mother   . Skin cancer Mother   . Hypertension Father     Social History   Socioeconomic History  . Marital status: Single    Spouse name: Not on file  . Number of children: Not on file  . Years of education: Not on file  . Highest education level: Not on file  Occupational History  . Not on file  Social Needs  . Financial resource strain: Not on file  . Food insecurity    Worry: Not on file    Inability: Not on file  . Transportation needs    Medical: Not on file    Non-medical: Not on file  Tobacco Use  . Smoking status: Never Smoker  . Smokeless tobacco: Never Used  Substance and Sexual Activity  . Alcohol use: Yes    Comment: occasional drinker, less than 2 drinks per week  . Drug use: Not on file  . Sexual activity: Not on file  Lifestyle  . Physical activity    Days per week: Not on file    Minutes per session: Not on file  . Stress: Not on file  Relationships  . Social Musician on phone: Not on file    Gets together: Not on file    Attends religious service: Not on file    Active member of club or organization: Not on file    Attends meetings of clubs or organizations: Not on file    Relationship status: Not on file  . Intimate partner violence    Fear of current or ex partner: Not on file    Emotionally abused: Not on file     Physically abused: Not on file    Forced sexual activity: Not on file  Other Topics Concern  . Not on file  Social History Narrative  . Not on file    Past Medical History, Surgical history, Social history, and Family history were reviewed and updated as appropriate.   Please see review of systems for further details on the patient's review from today.   Objective:   Physical Exam:  Wt 178 lb (80.7 kg)   BMI 27.88 kg/m   Physical Exam Neurological:     Mental Status: She is alert and oriented to person, place, and time.     Cranial Nerves: No dysarthria.  Psychiatric:        Attention and Perception: Attention normal.  Mood and Affect: Mood normal.        Speech: Speech normal.        Behavior: Behavior is cooperative.        Thought Content: Thought content normal. Thought content is not paranoid or delusional. Thought content does not include homicidal or suicidal ideation. Thought content does not include homicidal or suicidal plan.        Cognition and Memory: Cognition and memory normal.        Judgment: Judgment normal.     Comments: Insight intact     Lab Review:     Component Value Date/Time   NA 140 05/02/2019 0708   K 3.6 05/02/2019 0708   CL 108 05/02/2019 0708   CO2 24 05/02/2019 0708   GLUCOSE 88 05/02/2019 0708   BUN 9 05/02/2019 0708   CREATININE 0.77 05/02/2019 0708   CALCIUM 9.7 05/02/2019 0708   PROT 6.5 10/30/2016 2144   ALBUMIN 3.8 10/30/2016 2144   AST 23 10/30/2016 2144   ALT 17 10/30/2016 2144   ALKPHOS 44 10/30/2016 2144   BILITOT 0.4 10/30/2016 2144   GFRNONAA >60 10/30/2016 2144   GFRAA >60 10/30/2016 2144       Component Value Date/Time   WBC 5.2 10/30/2016 2144   RBC 4.24 10/30/2016 2144   HGB 12.9 10/30/2016 2144   HCT 37.1 10/30/2016 2144   PLT 179 10/30/2016 2144   MCV 87.5 10/30/2016 2144   MCH 30.4 10/30/2016 2144   MCHC 34.8 10/30/2016 2144   RDW 12.9 10/30/2016 2144    Lithium Lvl  Date Value Ref Range  Status  05/02/2019 0.8 0.6 - 1.2 mmol/L Final     No results found for: PHENYTOIN, PHENOBARB, VALPROATE, CBMZ   .res Assessment: Plan:   Patient seen for 25 minutes and greater than 50% of visit spent counseling patient to include discussing recheck of lithium level and metabolic panel to assess for any adverse effects of lithium.  Will send lab requisition electronically to [facility]. Patient reports that mood and anxiety signs and symptoms have been well controlled overall and would prefer to continue current plan of care. Recommend continuing to see therapist. Patient to follow-up in 6 months or sooner if clinically indicated. Patient advised to contact office with any questions, adverse effects, or acute worsening in signs and symptoms.  [name] was seen today for anxiety and follow-up.  Diagnoses and all orders for this visit:  High risk medication use -     Lithium level -     Basic metabolic panel  Bipolar I disorder (HCC) -     lithium 600 MG capsule; Take 2 capsules (1,200 mg total) by mouth at bedtime. -     lithium carbonate 300 MG capsule; Take 1 capsule (300 mg total) by mouth every morning. -     ziprasidone (GEODON) 20 MG capsule; Take 1 capsule (20 mg total) by mouth every morning. -     ziprasidone (GEODON) 60 MG capsule; Take 1 capsule (60 mg total) by mouth at bedtime.  Anxiety state Comments: Chronic, stable Orders: -     propranolol (INDERAL) 10 MG tablet; Take 1 tablet (10 mg total) by mouth 2 (two) times daily. -     gabapentin (NEURONTIN) 300 MG capsule; Take 1 capsule (300 mg total) by mouth 2 (two) times daily. -     Lithium level -     hydrOXYzine (VISTARIL) 25 MG capsule; Take 1 capsule (25 mg total) by mouth 3 (  three) times daily as needed.    Please see After Visit Summary for patient specific instructions.  Future Appointments  Date Time Provider [place]  05/06/2020  9:00 AM Thayer Headings, PMHNP CP-[facility] None    Orders Placed This  Encounter  Procedures  . Lithium level  . Basic metabolic panel      -------------------------------

## 2019-11-11 LAB — BASIC METABOLIC PANEL WITH GFR
BUN: 8 mg/dL (ref 7–25)
CO2: 25 mmol/L (ref 20–32)
Calcium: 9.6 mg/dL (ref 8.6–10.2)
Chloride: 106 mmol/L (ref 98–110)
Creat: 0.82 mg/dL (ref 0.50–1.10)
Glucose, Bld: 97 mg/dL (ref 65–99)
Potassium: 4 mmol/L (ref 3.5–5.3)
Sodium: 139 mmol/L (ref 135–146)

## 2019-11-11 LAB — LITHIUM LEVEL: Lithium Lvl: 0.8 mmol/L (ref 0.6–1.2)

## 2019-11-21 NOTE — Progress Notes (Signed)
Left detailed voicemail her labs were normal and her lithium level, to call back with questions.

## 2020-02-10 ENCOUNTER — Ambulatory Visit: Payer: Self-pay | Attending: Internal Medicine

## 2020-02-10 DIAGNOSIS — Z23 Encounter for immunization: Secondary | ICD-10-CM

## 2020-02-10 NOTE — Progress Notes (Signed)
   Covid-19 Vaccination Clinic  Name:  Idaliz Tinkle    MRN: [id number] DOB: 04-21-1991  02/10/2020  Ms. Collins was observed post Covid-19 immunization for 15 minutes without incident. She was provided with Vaccine Information Sheet and instruction to access the V-Safe system.   Ms. Thomasena Edis was instructed to call 911 with any severe reactions post vaccine: Marland Kitchen Difficulty breathing  . Swelling of face and throat  . A fast heartbeat  . A bad rash all over body  . Dizziness and weakness   Immunizations Administered    Name Date Dose VIS Date Route   Pfizer COVID-19 Vaccine 02/10/2020  4:08 PM 0.3 mL 11/10/2019 Intramuscular   Manufacturer: [employer], [employer]   Lot: [id number]   NDC: [address]-6

## 2020-03-06 ENCOUNTER — Ambulatory Visit: Payer: Self-pay | Attending: Internal Medicine

## 2020-03-06 DIAGNOSIS — Z23 Encounter for immunization: Secondary | ICD-10-CM

## 2020-03-06 NOTE — Progress Notes (Signed)
   Covid-19 Vaccination Clinic  Name:  Tracy Russo    MRN: [id number] DOB: 1991-06-17  03/06/2020  Tracy Russo was observed post Covid-19 immunization for 15 minutes without incident. She was provided with Vaccine Information Sheet and instruction to access the V-Safe system.   Tracy Russo was instructed to call 911 with any severe reactions post vaccine: Marland Kitchen Difficulty breathing  . Swelling of face and throat  . A fast heartbeat  . A bad rash all over body  . Dizziness and weakness   Immunizations Administered    Name Date Dose VIS Date Route   Pfizer COVID-19 Vaccine 03/06/2020  4:23 PM 0.3 mL 11/10/2019 Intramuscular   Manufacturer: [employer], [employer]   Lot: [id number]   NDC: [address]-4

## 2020-05-06 ENCOUNTER — Other Ambulatory Visit: Payer: Self-pay

## 2020-05-06 ENCOUNTER — Ambulatory Visit (INDEPENDENT_AMBULATORY_CARE_PROVIDER_SITE_OTHER): Payer: BC Managed Care – PPO | Admitting: Psychiatry

## 2020-05-06 ENCOUNTER — Encounter: Payer: Self-pay | Admitting: Psychiatry

## 2020-05-06 VITALS — BP 123/83 | HR 52

## 2020-05-06 DIAGNOSIS — Z79899 Other long term (current) drug therapy: Secondary | ICD-10-CM | POA: Diagnosis not present

## 2020-05-06 DIAGNOSIS — F319 Bipolar disorder, unspecified: Secondary | ICD-10-CM

## 2020-05-06 DIAGNOSIS — F411 Generalized anxiety disorder: Secondary | ICD-10-CM | POA: Diagnosis not present

## 2020-05-06 MED ORDER — PROPRANOLOL HCL 10 MG PO TABS: 10.0000 mg | ORAL_TABLET | Freq: Two times a day (BID) | ORAL | 1 refills | Status: DC

## 2020-05-06 MED ORDER — LITHIUM CARBONATE 600 MG PO CAPS: 1200.0000 mg | ORAL_CAPSULE | Freq: Every day | ORAL | 1 refills | Status: DC

## 2020-05-06 MED ORDER — GABAPENTIN 300 MG PO CAPS: 300.0000 mg | ORAL_CAPSULE | Freq: Two times a day (BID) | ORAL | 1 refills | Status: DC

## 2020-05-06 MED ORDER — ZIPRASIDONE HCL 60 MG PO CAPS: 60.0000 mg | ORAL_CAPSULE | Freq: Every day | ORAL | 1 refills | Status: DC

## 2020-05-06 MED ORDER — ZIPRASIDONE HCL 20 MG PO CAPS
20.0000 mg | ORAL_CAPSULE | Freq: Every morning | ORAL | 1 refills | Status: DC
Start: 2020-05-06 — End: 2020-11-04

## 2020-05-06 MED ORDER — LITHIUM CARBONATE 300 MG PO CAPS: 300.0000 mg | ORAL_CAPSULE | ORAL | 1 refills | Status: DC

## 2020-05-06 NOTE — Progress Notes (Signed)
Tracy Russo [id number] [birth year] 29 y.o.  Subjective:   Patient ID:  Tracy Russo is a 29 y.o. (DOB 1990/12/31) female.  Chief Complaint:  Chief Complaint  Patient presents with  . Follow-up    h/o Bipolar D/O and anxiety    HPI Tracy Russo presents to the office today for follow-up of mood and anxiety. She has graduated and got engaged soon after. Planning wedding for August 7th. Now applying for [employer] jobs and planning her wedding. 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   She reports that she has been "a little emotional sometimes... 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 Has some anxiety with dog sitting, such as making sure she gets there in time and if she has secured everything when she left. She reports that she is usually able to talk herself through re-assuring herself that she did what she needed to.   Denies any manic s/s. Denies depressed mood. Denies any significant irritability. She reports that she has been somewhat more talkative and attributes this to being able to see more people after pandemic improving. She reports that there have been a couple of nights that she had difficulty falling asleep and has read until she goes to sleep. Usually will go to bed around 9 pm and awakens 7-9 am. Appetite has been good. Reports that she is overeating less. Energy and motivation have been ok. Has been reading consistently. Concentration is ok when she is engaged in a task. She reports that her concentration is interrupted by wanting to look at her phone. Denies impulsive or risky behavior. Denies SI.   Going through pre-marital counseling at their church. Has been dog sitting frequently.    Past Psychiatric Medication  Trials: Abilify-ineffective Zyprexa-ineffective Geodon-effective Depakote Lithium Gabapentin Hydroxyzine Propanolol Trazodone  AIMS     Office Visit from 05/11/2019 in [facility]  AIMS Total Score  0       Review of Systems:  Review of Systems  Gastrointestinal: Negative.   Musculoskeletal: Negative for gait problem.  Neurological: Positive for headaches. Negative for dizziness and tremors.  Psychiatric/Behavioral:       Please refer to HPI    Medications: I have reviewed the patient's current medications.  Current Outpatient Medications  Medication Sig Dispense Refill  . levonorgestrel (KYLEENA) 19.5 MG IUD by Intrauterine route once.    . gabapentin (NEURONTIN) 300 MG capsule Take 1 capsule (300 mg total) by mouth 2 (two) times daily. 180 capsule 1  . lithium 600 MG capsule Take 2 capsules (1,200 mg total) by mouth at bedtime. 180 capsule 1  . lithium carbonate 300 MG capsule Take 1 capsule (300 mg total) by mouth every morning. 90 capsule 1  . propranolol (INDERAL) 10 MG tablet Take 1 tablet (10 mg total) by mouth 2 (two) times daily. 180 tablet 1  . ziprasidone (GEODON) 20 MG capsule Take 1 capsule (20 mg total) by mouth every morning. 90 capsule 1  . ziprasidone (GEODON) 60 MG capsule Take 1 capsule (60 mg total) by mouth at bedtime. 90 capsule 1   No current facility-administered medications for this visit.    Medication Side Effects: Other: Possible memory issues (could not remember words to songs)  Will sometimes not recall parts of books that she has read.   Allergies: No Known Allergies  History reviewed. No pertinent past medical history.  Family History  Problem Relation Age of Onset  . Bipolar disorder Mother   . Skin cancer Mother   . Hypertension Father     Social History   Socioeconomic History  . Marital status: Single    Spouse name: Not on file  . Number of children: Not on file  . Years of education: Not on file  .  Highest education level: Not on file  Occupational History  . Not on file  Tobacco Use  . Smoking status: Never Smoker  . Smokeless tobacco: Never Used  Substance and Sexual Activity  . Alcohol use: Yes    Comment: occasional drinker, less than 2 drinks per week  . Drug use: Not on file  . Sexual activity: Not on file  Other Topics Concern  . Not on file  Social History Narrative  . Not on file   Social Determinants of Health   Financial Resource Strain:   . Difficulty of Paying Living Expenses:   Food Insecurity:   . Worried About Charity fundraiser in the Last Year:   . Arboriculturist in the Last Year:   Transportation Needs:   . Film/video editor (Medical):   Marland Kitchen Lack of Transportation (Non-Medical):   Physical Activity:   . Days of Exercise per Week:   . Minutes of Exercise per Session:   Stress:   . Feeling of Stress :   Social Connections:   . Frequency of Communication with Friends and Family:   . Frequency of Social Gatherings with Friends and Family:   . Attends Religious Services:   . Active Member of Clubs or Organizations:   . Attends Archivist Meetings:   Marland Kitchen Marital Status:   Intimate Partner Violence:   . Fear of Current or Ex-Partner:   . Emotionally Abused:   Marland Kitchen Physically Abused:   . Sexually Abused:     Past Medical History, Surgical history, Social history, and Family history were reviewed and updated as appropriate.   Please see review of systems for further details on the patient's review from today.   Objective:   Physical Exam:  BP 123/83   Pulse (!) 52   Physical Exam Constitutional:      General: She is not in acute distress. Musculoskeletal:        General: No deformity.  Neurological:     Mental Status: She is alert and oriented to person, place, and time.     Coordination: Coordination normal.  Psychiatric:        Attention and Perception: Attention and perception normal. She does not perceive auditory or visual  hallucinations.        Mood and Affect: Mood normal. Mood is not anxious or depressed. Affect is not labile, blunt, angry or inappropriate.        Speech: Speech normal.        Behavior: Behavior normal.        Thought Content: Thought content normal. Thought content is not paranoid or delusional. Thought content does not include homicidal or suicidal ideation. Thought content does not include homicidal or suicidal plan.        Cognition and Memory: Cognition and memory normal.        Judgment: Judgment normal.     Comments: Insight intact     Lab Review:     Component Value Date/Time   NA 139 11/10/2019 0819   K 4.0 11/10/2019 0819   CL 106 11/10/2019 0819  CO2 25 11/10/2019 0819   GLUCOSE 97 11/10/2019 0819   BUN 8 11/10/2019 0819   CREATININE 0.82 11/10/2019 0819   CALCIUM 9.6 11/10/2019 0819   PROT 6.5 10/30/2016 2144   ALBUMIN 3.8 10/30/2016 2144   AST 23 10/30/2016 2144   ALT 17 10/30/2016 2144   ALKPHOS 44 10/30/2016 2144   BILITOT 0.4 10/30/2016 2144   GFRNONAA >60 10/30/2016 2144   GFRAA >60 10/30/2016 2144       Component Value Date/Time   WBC 5.2 10/30/2016 2144   RBC 4.24 10/30/2016 2144   HGB 12.9 10/30/2016 2144   HCT 37.1 10/30/2016 2144   PLT 179 10/30/2016 2144   MCV 87.5 10/30/2016 2144   MCH 30.4 10/30/2016 2144   MCHC 34.8 10/30/2016 2144   RDW 12.9 10/30/2016 2144    Lithium Lvl  Date Value Ref Range Status  11/10/2019 0.8 0.6 - 1.2 mmol/L Final     No results found for: PHENYTOIN, PHENOBARB, VALPROATE, CBMZ   .res Assessment: Plan:   Will continue current plan of care since target signs and symptoms are well controlled without any tolerability issues. Will continue lithium 300 mg in the morning and 1200 mg at bedtime for mood stabilization. Will continue Geodon 20 mg in the morning and 60 mg at bedtime for mood stabilization. Continue gabapentin 300 mg twice daily for anxiety. Continue propanolol 10 mg twice daily for anxiety. Will  order lithium level, BMP, and TSH to evaluate for any possible adverse effects with lithium. Patient to follow-up in 6 months or sooner if clinically indicated. Patient advised to contact office with any questions, adverse effects, or acute worsening in signs and symptoms.  Ettel was seen today for follow-up.  Diagnoses and all orders for this visit:  Bipolar I disorder (HCC) -     lithium 600 MG capsule; Take 2 capsules (1,200 mg total) by mouth at bedtime. -     lithium carbonate 300 MG capsule; Take 1 capsule (300 mg total) by mouth every morning. -     ziprasidone (GEODON) 20 MG capsule; Take 1 capsule (20 mg total) by mouth every morning. -     ziprasidone (GEODON) 60 MG capsule; Take 1 capsule (60 mg total) by mouth at bedtime.  Anxiety state Comments: Chronic, stable Orders: -     gabapentin (NEURONTIN) 300 MG capsule; Take 1 capsule (300 mg total) by mouth 2 (two) times daily. -     propranolol (INDERAL) 10 MG tablet; Take 1 tablet (10 mg total) by mouth 2 (two) times daily.  High risk medication use -     Lithium level -     TSH -     Basic metabolic panel     Please see After Visit Summary for patient specific instructions.  Future Appointments  Date Time Provider Department Center  11/04/2020  8:00 AM Corie Chiquito, PMHNP [facility] None    Orders Placed This Encounter  Procedures  . Lithium level  . TSH  . Basic metabolic panel    -------------------------------

## 2020-05-14 LAB — LITHIUM LEVEL: Lithium Lvl: 1 mmol/L (ref 0.6–1.2)

## 2020-05-14 LAB — BASIC METABOLIC PANEL
BUN/Creatinine Ratio: 8 (calc) (ref 6–22)
BUN: 6 mg/dL — ABNORMAL LOW (ref 7–25)
CO2: 29 mmol/L (ref 20–32)
Calcium: 9.5 mg/dL (ref 8.6–10.2)
Chloride: 107 mmol/L (ref 98–110)
Creat: 0.72 mg/dL (ref 0.50–1.10)
Glucose, Bld: 93 mg/dL (ref 65–99)
Potassium: 4 mmol/L (ref 3.5–5.3)
Sodium: 141 mmol/L (ref 135–146)

## 2020-05-14 LAB — TSH: TSH: 3.3 mIU/L

## 2020-11-04 ENCOUNTER — Other Ambulatory Visit: Payer: Self-pay

## 2020-11-04 ENCOUNTER — Encounter: Payer: Self-pay | Admitting: Psychiatry

## 2020-11-04 ENCOUNTER — Ambulatory Visit (INDEPENDENT_AMBULATORY_CARE_PROVIDER_SITE_OTHER): Payer: 59 | Admitting: Psychiatry

## 2020-11-04 DIAGNOSIS — F319 Bipolar disorder, unspecified: Secondary | ICD-10-CM

## 2020-11-04 DIAGNOSIS — F411 Generalized anxiety disorder: Secondary | ICD-10-CM

## 2020-11-04 DIAGNOSIS — Z79899 Other long term (current) drug therapy: Secondary | ICD-10-CM

## 2020-11-04 MED ORDER — LITHIUM CARBONATE 300 MG PO CAPS: 300.0000 mg | ORAL_CAPSULE | ORAL | 1 refills | Status: DC

## 2020-11-04 MED ORDER — LITHIUM CARBONATE 600 MG PO CAPS: 1200.0000 mg | ORAL_CAPSULE | Freq: Every day | ORAL | 1 refills | Status: DC

## 2020-11-04 MED ORDER — ZIPRASIDONE HCL 20 MG PO CAPS: 20.0000 mg | ORAL_CAPSULE | Freq: Every morning | ORAL | 1 refills | Status: DC

## 2020-11-04 MED ORDER — GABAPENTIN 300 MG PO CAPS: 300.0000 mg | ORAL_CAPSULE | Freq: Two times a day (BID) | ORAL | 1 refills | Status: DC

## 2020-11-04 MED ORDER — PROPRANOLOL HCL 10 MG PO TABS: 10.0000 mg | ORAL_TABLET | Freq: Two times a day (BID) | ORAL | 1 refills | Status: DC

## 2020-11-04 MED ORDER — ZIPRASIDONE HCL 60 MG PO CAPS: 60.0000 mg | ORAL_CAPSULE | Freq: Every day | ORAL | 1 refills | Status: DC

## 2020-11-04 NOTE — Progress Notes (Signed)
   11/04/20 0901  Facial and Oral Movements  Muscles of Facial Expression 0  Lips and Perioral Area 0  Jaw 0  Tongue 0  Extremity Movements  Upper (arms, wrists, hands, fingers) 0  Lower (legs, knees, ankles, toes) 0  Trunk Movements  Neck, shoulders, hips 0  Overall Severity  Severity of abnormal movements (highest score from questions above) 0  Incapacitation due to abnormal movements 0  Patient's awareness of abnormal movements (rate only patient's report) 0  AIMS Total Score  AIMS Total Score 0

## 2020-11-04 NOTE — Progress Notes (Signed)
Tracy Russo [id number] 1991/07/26 29 y.o.  Subjective:   Patient ID:  Tracy Russo is a 29 y.o. (DOB [birth year]) female.  Chief Complaint:  Chief Complaint  Patient presents with  . Follow-up    Bipolar D/O, Anxiety    HPI Tracy Russo presents to the office today for follow-up of Bipolar D/O, Anxiety, and h/o sleep disturbance. Started new job a Writer. She notices anxiety the day after she has alcohol, particularly with driving. Has been limiting ETOH use since this seems to trigger anxiety.  Had some anxiety with driving [employer] for her work. She reports that she had some increased anxiety leading up to her wedding in response to stress. Mood has been "ok." Denies depressed mood. She reports that she has been spending more than usual and this may be related to Christmas shopping. Denies any other impulsive or risky behaviors. Notices occasional frustration with certain tasks. She reports that she sleeps a lot and does not feel as well rested as she would expect. Sleeping about 9 hours a night. She reports that she feels tired for the first few hours in the morning and then around 7-8 pm. Energy is otherwise ok. She reports that her motivation is ok. Concentration is ok. Appetite has been ok. Denies SI.   Recently got married on 07/06/20 and will be changing her name to Tracy Russo. Moved to [place] into her husband's house. Has been exercising less with shorter days.   Past Psychiatric Medication Trials: Abilify-ineffective Zyprexa-ineffective Geodon-effective Depakote Lithium Gabapentin Hydroxyzine Propanolol Trazodone   AIMS     Office Visit from 11/04/2020 in [facility] Office Visit from 05/11/2019 in [facility]  AIMS Total Score 0 0       Review of Systems:  Review of Systems  Gastrointestinal: Negative.   Musculoskeletal: Negative for gait problem.  Neurological: Negative for tremors.        Reports that she had tremors one day and attributes this to increased caffeine intake.  Psychiatric/Behavioral:       Please refer to HPI    Medications: I have reviewed the patient's current medications.  Current Outpatient Medications  Medication Sig Dispense Refill  . levonorgestrel (KYLEENA) 19.5 MG IUD by Intrauterine route once.    . ziprasidone (GEODON) 60 MG capsule Take 1 capsule (60 mg total) by mouth at bedtime. 90 capsule 1  . gabapentin (NEURONTIN) 300 MG capsule Take 1 capsule (300 mg total) by mouth 2 (two) times daily. 180 capsule 1  . lithium 600 MG capsule Take 2 capsules (1,200 mg total) by mouth at bedtime. 180 capsule 1  . lithium carbonate 300 MG capsule Take 1 capsule (300 mg total) by mouth every morning. 90 capsule 1  . propranolol (INDERAL) 10 MG tablet Take 1 tablet (10 mg total) by mouth 2 (two) times daily. 180 tablet 1  . ziprasidone (GEODON) 20 MG capsule Take 1 capsule (20 mg total) by mouth every morning. 90 capsule 1   No current facility-administered medications for this visit.    Medication Side Effects: Fatigue and Other: Some difficulty with recalling certain events  Allergies: No Known Allergies  History reviewed. No pertinent past medical history.  Family History  Problem Relation Age of Onset  . Bipolar disorder Mother   . Skin cancer Mother   . Hypertension Father     Social History   Socioeconomic History  . Marital status: Single    Spouse name: Not on  file  . Number of children: Not on file  . Years of education: Not on file  . Highest education level: Not on file  Occupational History  . Not on file  Tobacco Use  . Smoking status: Never Smoker  . Smokeless tobacco: Never Used  Substance and Sexual Activity  . Alcohol use: Yes    Comment: occasional drinker, less than 2 drinks per week  . Drug use: Not on file  . Sexual activity: Not on file  Other Topics Concern  . Not on file  Social History Narrative  . Not on file    Social Determinants of Health   Financial Resource Strain:   . Difficulty of Paying Living Expenses: Not on file  Food Insecurity:   . Worried About Programme researcher, broadcasting/film/video in the Last Year: Not on file  . Ran Out of Food in the Last Year: Not on file  Transportation Needs:   . Lack of Transportation (Medical): Not on file  . Lack of Transportation (Non-Medical): Not on file  Physical Activity:   . Days of Exercise per Week: Not on file  . Minutes of Exercise per Session: Not on file  Stress:   . Feeling of Stress : Not on file  Social Connections:   . Frequency of Communication with Friends and Family: Not on file  . Frequency of Social Gatherings with Friends and Family: Not on file  . Attends Religious Services: Not on file  . Active Member of Clubs or Organizations: Not on file  . Attends Banker Meetings: Not on file  . Marital Status: Not on file  Intimate Partner Violence:   . Fear of Current or Ex-Partner: Not on file  . Emotionally Abused: Not on file  . Physically Abused: Not on file  . Sexually Abused: Not on file    Past Medical History, Surgical history, Social history, and Family history were reviewed and updated as appropriate.   Please see review of systems for further details on the patient's review from today.   Objective:   Physical Exam:  There were no vitals taken for this visit.  Physical Exam Constitutional:      General: She is not in acute distress. Musculoskeletal:        General: No deformity.  Neurological:     Mental Status: She is alert and oriented to person, place, and time.     Coordination: Coordination normal.  Psychiatric:        Attention and Perception: Attention and perception normal. She does not perceive auditory or visual hallucinations.        Mood and Affect: Mood normal. Mood is not anxious or depressed. Affect is not labile, blunt, angry or inappropriate.        Speech: Speech normal.        Behavior:  Behavior normal.        Thought Content: Thought content normal. Thought content is not paranoid or delusional. Thought content does not include homicidal or suicidal ideation. Thought content does not include homicidal or suicidal plan.        Cognition and Memory: Cognition and memory normal.        Judgment: Judgment normal.     Comments: Insight intact     Lab Review:     Component Value Date/Time   NA 141 05/13/2020 0926   K 4.0 05/13/2020 0926   CL 107 05/13/2020 0926   CO2 29 05/13/2020 0926   GLUCOSE 93 05/13/2020  0926   BUN 6 (L) 05/13/2020 0926   CREATININE 0.72 05/13/2020 0926   CALCIUM 9.5 05/13/2020 0926   PROT 6.5 10/30/2016 2144   ALBUMIN 3.8 10/30/2016 2144   AST 23 10/30/2016 2144   ALT 17 10/30/2016 2144   ALKPHOS 44 10/30/2016 2144   BILITOT 0.4 10/30/2016 2144   GFRNONAA >60 10/30/2016 2144   GFRAA >60 10/30/2016 2144       Component Value Date/Time   WBC 5.2 10/30/2016 2144   RBC 4.24 10/30/2016 2144   HGB 12.9 10/30/2016 2144   HCT 37.1 10/30/2016 2144   PLT 179 10/30/2016 2144   MCV 87.5 10/30/2016 2144   MCH 30.4 10/30/2016 2144   MCHC 34.8 10/30/2016 2144   RDW 12.9 10/30/2016 2144    Lithium Lvl  Date Value Ref Range Status  05/13/2020 1.0 0.6 - 1.2 mmol/L Final     No results found for: PHENYTOIN, PHENOBARB, VALPROATE, CBMZ   .res Assessment: Plan:   Consider therapy referrals to either Tracy Shutter, LCSW or Tracy Russo, [facility] since she reports that her previous therapist is now out of network and it is cost-prohibitive to see her.  Discussed considering dose reduction in the future to possibly minimize daytime fatigue. Discussed not decreasing dose at this time since medications that may be causing some drowsiness may be improving anxiety and she has recently started a new job. Will consider dose reduction at next visit.  Will also order Lithium level and BMP at this time.  Continue Gabapentin 300 mg po BID for anxiety.  Continue  Lithium 300 mg po q am and 1200 mg po QHS for mood stabilization.  Continue Geodon 20 mg po q am and 60 mg po QHS for mood stabilization.  Continue Propranolol 10 mg po BID for anxiety.  Pt to follow-up in 6 months or sooner if clinically indicated.  Patient advised to contact office with any questions, adverse effects, or acute worsening in signs and symptoms.   Avalene was seen today for follow-up.  Diagnoses and all orders for this visit:  High risk medication use -     Lithium level -     Basic metabolic panel  Anxiety state Comments: Chronic, stable Orders: -     gabapentin (NEURONTIN) 300 MG capsule; Take 1 capsule (300 mg total) by mouth 2 (two) times daily. -     propranolol (INDERAL) 10 MG tablet; Take 1 tablet (10 mg total) by mouth 2 (two) times daily.  Bipolar I disorder (HCC) -     lithium 600 MG capsule; Take 2 capsules (1,200 mg total) by mouth at bedtime. -     lithium carbonate 300 MG capsule; Take 1 capsule (300 mg total) by mouth every morning. -     ziprasidone (GEODON) 20 MG capsule; Take 1 capsule (20 mg total) by mouth every morning. -     ziprasidone (GEODON) 60 MG capsule; Take 1 capsule (60 mg total) by mouth at bedtime.     Please see After Visit Summary for patient specific instructions.  Future Appointments  Date Time Provider Department Center  05/06/2021  9:00 AM Tracy Russo, PMHNP [facility] None    Orders Placed This Encounter  Procedures  . Lithium level  . Basic metabolic panel    -------------------------------

## 2021-01-29 LAB — BASIC METABOLIC PANEL
BUN: 8 mg/dL (ref 7–25)
CO2: 29 mmol/L (ref 20–32)
Calcium: 9.7 mg/dL (ref 8.6–10.2)
Chloride: 104 mmol/L (ref 98–110)
Creat: 0.77 mg/dL (ref 0.50–1.10)
Glucose, Bld: 91 mg/dL (ref 65–99)
Potassium: 3.9 mmol/L (ref 3.5–5.3)
Sodium: 138 mmol/L (ref 135–146)

## 2021-01-29 LAB — LITHIUM LEVEL: Lithium Lvl: 0.8 mmol/L (ref 0.6–1.2)

## 2021-05-05 ENCOUNTER — Other Ambulatory Visit: Payer: Self-pay | Admitting: Psychiatry

## 2021-05-05 DIAGNOSIS — F319 Bipolar disorder, unspecified: Secondary | ICD-10-CM

## 2021-05-05 DIAGNOSIS — F411 Generalized anxiety disorder: Secondary | ICD-10-CM

## 2021-05-05 NOTE — Telephone Encounter (Signed)
noted 

## 2021-05-05 NOTE — Telephone Encounter (Signed)
Please review

## 2021-05-06 ENCOUNTER — Encounter: Payer: Self-pay | Admitting: Psychiatry

## 2021-05-06 ENCOUNTER — Other Ambulatory Visit: Payer: Self-pay

## 2021-05-06 ENCOUNTER — Ambulatory Visit (INDEPENDENT_AMBULATORY_CARE_PROVIDER_SITE_OTHER): Payer: 59 | Admitting: Psychiatry

## 2021-05-06 DIAGNOSIS — F319 Bipolar disorder, unspecified: Secondary | ICD-10-CM | POA: Diagnosis not present

## 2021-05-06 DIAGNOSIS — F411 Generalized anxiety disorder: Secondary | ICD-10-CM

## 2021-05-06 MED ORDER — PROPRANOLOL HCL 10 MG PO TABS: 10.0000 mg | ORAL_TABLET | Freq: Two times a day (BID) | ORAL | 1 refills | Status: DC

## 2021-05-06 MED ORDER — GABAPENTIN 300 MG PO CAPS: 300.0000 mg | ORAL_CAPSULE | Freq: Every day | ORAL | 1 refills | Status: DC

## 2021-05-06 MED ORDER — LITHIUM CARBONATE 600 MG PO CAPS: 1200.0000 mg | ORAL_CAPSULE | Freq: Every day | ORAL | 1 refills | Status: DC

## 2021-05-06 MED ORDER — ZIPRASIDONE HCL 60 MG PO CAPS: 60.0000 mg | ORAL_CAPSULE | Freq: Every day | ORAL | 1 refills | Status: DC

## 2021-05-06 MED ORDER — ZIPRASIDONE HCL 20 MG PO CAPS: 20.0000 mg | ORAL_CAPSULE | Freq: Every morning | ORAL | 1 refills | Status: DC

## 2021-05-06 MED ORDER — LITHIUM CARBONATE 300 MG PO CAPS: 300.0000 mg | ORAL_CAPSULE | ORAL | 1 refills | Status: DC

## 2021-05-06 NOTE — Progress Notes (Signed)
Tracy Russo [id number] 02-Feb-1991 30 y.o.  Subjective:   Patient ID:  Tracy Russo is a 30 y.o. (DOB Apr 27, 1991) female.  Chief Complaint:  Chief Complaint  Patient presents with  . Follow-up    Bipolar D/O and Anxiety    HPI Tracy Russo presents to the office today for follow-up of Bipolar D/O and anxiety. 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 She reports that anxiety has been "better." She reports that her work contract ends soon and she has not been very anxious about this. She reports that her mood has been "stable." Typically takes about 30 minutes to fall asleep and then has difficulty getting up in the morning. She tries to get up by 7:30 every morning regardless of start time for work. She has been trying to read at night to wind down before bed. She is also trying to walk more. She forgot her medication one night and was not able to sleep. She feels that medication helps with her sleep. She tries to take Geodon around 7 pm when she has dinner and some nights is taking later. Takes morning dose 7:30-8:30 am. She reports that she has occasional tiredness and low energy, particularly in the afternoon and could be related to boredom. She is trying to limit caffeine later in the day and not using it to help with low energy. She is trying to eat healthier and packing her lunch. Denies impulsive behavior. She reports that her motivation is "up and down." She reports that her concentration is ok and occasionally will not be as focused and will jump between different tasks. Has been able to complete work assignments in a reasonable amount of time. Denies SI.   Past Psychiatric Medication Trials: Abilify-ineffective Zyprexa-ineffective Geodon-  effective Gabapentin Depakote Lithium Gabapentin Hydroxyzine Propanolol Trazodone  AIMS   Flowsheet Row Office Visit from 05/06/2021 in [facility] Office Visit from 11/04/2020 in [facility] Office Visit from 05/11/2019 in [facility]  AIMS Total Score 0 0 0       Review of Systems:  Review of Systems  Musculoskeletal: Negative for gait problem.  Neurological: Positive for headaches. Negative for tremors.  Psychiatric/Behavioral:       Please refer to HPI    PCP changed to PA at [facility].   Medications: I have reviewed the patient's current medications.  Current Outpatient Medications  Medication Sig Dispense Refill  . levonorgestrel (KYLEENA) 19.5 MG IUD by Intrauterine route once.    . ziprasidone (GEODON) 60 MG capsule Take 1 capsule (60 mg total) by mouth at bedtime. 90 capsule 1  . gabapentin (NEURONTIN) 300 MG capsule Take 1 capsule (300 mg total) by mouth 2 (two) times daily. 180 capsule 1  . lithium 600 MG capsule Take 2 capsules (1,200 mg total) by mouth at bedtime. 180 capsule 1  . lithium carbonate 300 MG capsule Take 1 capsule (300 mg total) by mouth every morning. 90 capsule 1  . propranolol (INDERAL) 10 MG tablet Take 1 tablet (10 mg total) by mouth 2 (two) times daily. 180 tablet 1  . ziprasidone (GEODON) 20 MG capsule Take 1 capsule (20 mg total) by mouth every morning. 90 capsule 1   No current facility-administered medications for this visit.    Medication Side  Effects: Other: Possible cognitive side effects, ?headaches  Allergies: No Known Allergies  History reviewed. No pertinent past medical history.  Past Medical History, Surgical history, Social history, and Family history were reviewed and updated as appropriate.   Please see review of systems for further details on the patient's review from today.   Objective:   Physical Exam:  There were no vitals taken for this visit.  Physical  Exam Constitutional:      General: She is not in acute distress. Musculoskeletal:        General: No deformity.  Neurological:     Mental Status: She is alert and oriented to person, place, and time.     Coordination: Coordination normal.  Psychiatric:        Attention and Perception: Attention and perception normal. She does not perceive auditory or visual hallucinations.        Mood and Affect: Mood normal. Mood is not anxious or depressed. Affect is not labile, blunt, angry or inappropriate.        Speech: Speech normal.        Behavior: Behavior normal.        Thought Content: Thought content normal. Thought content is not paranoid or delusional. Thought content does not include homicidal or suicidal ideation. Thought content does not include homicidal or suicidal plan.        Cognition and Memory: Cognition and memory normal.        Judgment: Judgment normal.     Comments: Insight intact     Lab Review:     Component Value Date/Time   NA 138 01/28/2021 1018   K 3.9 01/28/2021 1018   CL 104 01/28/2021 1018   CO2 29 01/28/2021 1018   GLUCOSE 91 01/28/2021 1018   BUN 8 01/28/2021 1018   CREATININE 0.77 01/28/2021 1018   CALCIUM 9.7 01/28/2021 1018   PROT 6.5 10/30/2016 2144   ALBUMIN 3.8 10/30/2016 2144   AST 23 10/30/2016 2144   ALT 17 10/30/2016 2144   ALKPHOS 44 10/30/2016 2144   BILITOT 0.4 10/30/2016 2144   GFRNONAA >60 10/30/2016 2144   GFRAA >60 10/30/2016 2144       Component Value Date/Time   WBC 5.2 10/30/2016 2144   RBC 4.24 10/30/2016 2144   HGB 12.9 10/30/2016 2144   HCT 37.1 10/30/2016 2144   PLT 179 10/30/2016 2144   MCV 87.5 10/30/2016 2144   MCH 30.4 10/30/2016 2144   MCHC 34.8 10/30/2016 2144   RDW 12.9 10/30/2016 2144    Lithium Lvl  Date Value Ref Range Status  01/28/2021 0.8 0.6 - 1.2 mmol/L Final     No results found for: PHENYTOIN, PHENOBARB, VALPROATE, CBMZ   .res Assessment: Plan:   Patient seen for 30 minutes and time spent  counseling patient regarding treatment plan. Discussed trying to take meds with dinner to limit somnolence upon awakening and also to increase absorption of Geodon. Will decrease Gabapentin to HS only to improve fatigue and sedation during the daytime since dose reductions in gabapentin have improved drowsiness in the past. Continue lithium 300 mg in the morning and 1200 mg at bedtime for mood stabilization. Continue propranolol 10 mg twice daily for anxiety. Continue Geodon 20 mg in the morning and 60 mg at bedtime for mood stabilization. Will obtain labs in 3 months which will be 6 months from last collected labs.  Patient to follow-up in 6 months or sooner if clinically indicated. Patient advised to contact office with any  questions, adverse effects, or acute worsening in signs and symptoms.    Sally was seen today for follow-up.  Diagnoses and all orders for this visit:  Anxiety state Comments: Chronic, stable  Bipolar I disorder (HCC)     Please see After Visit Summary for patient specific instructions.  No future appointments.  No orders of the defined types were placed in this encounter.   -------------------------------

## 2021-05-06 NOTE — Progress Notes (Signed)
   05/06/21 0928  Facial and Oral Movements  Muscles of Facial Expression 0  Lips and Perioral Area 0  Jaw 0  Tongue 0  Extremity Movements  Upper (arms, wrists, hands, fingers) 0  Lower (legs, knees, ankles, toes) 0  Trunk Movements  Neck, shoulders, hips 0  Overall Severity  Severity of abnormal movements (highest score from questions above) 0  Incapacitation due to abnormal movements 0  Patient's awareness of abnormal movements (rate only patient's report) 0  AIMS Total Score  AIMS Total Score 0

## 2021-08-05 ENCOUNTER — Telehealth: Payer: Self-pay | Admitting: Psychiatry

## 2021-08-05 DIAGNOSIS — Z79899 Other long term (current) drug therapy: Secondary | ICD-10-CM

## 2021-08-05 NOTE — Telephone Encounter (Signed)
Pt saw PCP provider today and was diagnosed with High Blood Pressure.  Starting to take Lucinapril  PCP suggests she have a lithium test.    Next appt 12/6

## 2021-08-05 NOTE — Telephone Encounter (Signed)
FYI

## 2021-08-05 NOTE — Telephone Encounter (Signed)
Please let pt know that order was sent to [facility] for Lithium level and BMP.

## 2021-08-06 NOTE — Telephone Encounter (Signed)
LVM with info

## 2021-08-20 ENCOUNTER — Telehealth: Payer: Self-pay | Admitting: Psychiatry

## 2021-08-20 LAB — BASIC METABOLIC PANEL
BUN/Creatinine Ratio: 11 (calc) (ref 6–22)
BUN: 11 mg/dL (ref 7–25)
CO2: 27 mmol/L (ref 20–32)
Calcium: 10.3 mg/dL — ABNORMAL HIGH (ref 8.6–10.2)
Chloride: 103 mmol/L (ref 98–110)
Creat: 1.02 mg/dL — ABNORMAL HIGH (ref 0.50–0.97)
Glucose, Bld: 87 mg/dL (ref 65–99)
Potassium: 4.2 mmol/L (ref 3.5–5.3)
Sodium: 137 mmol/L (ref 135–146)

## 2021-08-20 LAB — LITHIUM LEVEL: Lithium Lvl: 1.3 mmol/L — ABNORMAL HIGH (ref 0.6–1.2)

## 2021-08-20 NOTE — Telephone Encounter (Signed)
Left message for patient and advised that case had been staff with Dr. Jennelle Human and plan is to re-check Lithium level and metabolic panel in 1-2 weeks as long as she is not having any s/s of elevated Lithium level/toxicity to include severe tremor, n/v, diarrhea, confusion, slurred speech, etc. Discussed that level may have appeared higher if she was dehydrated at time of blood draw after eating something that upset her stomach the night before. Advised lab orders would be sent to [facility] and she would be notified when lab results become available. Advised she call back with any questions or if she is having any s/s of Lithium toxicity.

## 2021-08-20 NOTE — Telephone Encounter (Signed)
Pt called back and gave information about Tracy Russo's message. I also contacted her, she is taking Lithium 300 mg in the am and 1200 mg in the pm.

## 2021-08-20 NOTE — Telephone Encounter (Signed)
Pt called reporting she had taken Lithium @ 7-7:30 pm night before labs taken @ 10:00 am. Side effects: stomach was a bit more upset than normal, but had also gone to ball game and thought it may be the food she ate there.  Contact # [phone]

## 2021-08-21 NOTE — Telephone Encounter (Signed)
Reviewed

## 2021-08-28 ENCOUNTER — Telehealth: Payer: Self-pay | Admitting: Psychiatry

## 2021-08-28 DIAGNOSIS — Z79899 Other long term (current) drug therapy: Secondary | ICD-10-CM

## 2021-08-28 NOTE — Telephone Encounter (Signed)
Pt informed

## 2021-08-28 NOTE — Telephone Encounter (Signed)
Pt LVM stating she was trying to get her labs, but the lab says they don't have the request.  Pls resend the lab request and call her back to let her know she can call back to schedule the appt with them.Also she wanted to advise that she had a slight tremor in her hand yesterday.

## 2021-08-28 NOTE — Telephone Encounter (Signed)
Please let her know orders were just sent in to [facility].

## 2021-08-28 NOTE — Telephone Encounter (Signed)
Please review

## 2021-08-30 LAB — BASIC METABOLIC PANEL
BUN/Creatinine Ratio: 12 (calc) (ref 6–22)
BUN: 13 mg/dL (ref 7–25)
CO2: 25 mmol/L (ref 20–32)
Calcium: 9.8 mg/dL (ref 8.6–10.2)
Chloride: 103 mmol/L (ref 98–110)
Creat: 1.12 mg/dL — ABNORMAL HIGH (ref 0.50–0.97)
Glucose, Bld: 89 mg/dL (ref 65–99)
Potassium: 4 mmol/L (ref 3.5–5.3)
Sodium: 134 mmol/L — ABNORMAL LOW (ref 135–146)

## 2021-08-30 LAB — LITHIUM LEVEL: Lithium Lvl: 1.6 mmol/L — ABNORMAL HIGH (ref 0.6–1.2)

## 2021-08-31 NOTE — Telephone Encounter (Signed)
Received a fax on 10/02 showing pt's elevated Lithium level of 1.6 drawn on 08/29/21. After reviewing notes contacted Dr. Jennelle Human who was aware of her situation from last week, he advised pt hold her PM dose of Lithium tonight and in the AM, then resume her nighttime dose of 1200 mg on Monday 09/01/21. Tried reaching pt but went to vm, I did leave specific instructions and to call back on my # with any questions.  Also mentioned a repeat of her labs probably in 7-10 days but would confirm with Shanda Bumps after reviewing her labs on Monday.

## 2021-09-01 NOTE — Telephone Encounter (Signed)
Called pt. She held Lithium last night and this morning.   She reports that she had a slight tremor one day last week and hands feel slightly "shaky." Appetite has been slightly decreased. Denies n/v or diarrhea. She reports that she has been more tired in the evenings. Drinking less water. She reports that she is continuing to drink throughout the day.   Started Lisinopril 10 mg. Confirmed it is not Lisinopril HCTZ.   She reports some slightly irritability this weekend and that this coincided with her menstrual cycle.   PLAN: Stop Lithium in the morning. Continue Lithium 1200 mg at bedtime.  Re-check Lithium and BMP in one week.  Discussed monitoring for s/s of Lithium toxicity and calling clinic if these emerge.

## 2021-09-01 NOTE — Telephone Encounter (Signed)
Reviewed

## 2021-09-11 LAB — BASIC METABOLIC PANEL
BUN/Creatinine Ratio: 13 (calc) (ref 6–22)
BUN: 14 mg/dL (ref 7–25)
CO2: 26 mmol/L (ref 20–32)
Calcium: 9.9 mg/dL (ref 8.6–10.2)
Chloride: 106 mmol/L (ref 98–110)
Creat: 1.04 mg/dL — ABNORMAL HIGH (ref 0.50–0.97)
Glucose, Bld: 96 mg/dL (ref 65–99)
Potassium: 4.1 mmol/L (ref 3.5–5.3)
Sodium: 139 mmol/L (ref 135–146)

## 2021-09-11 LAB — LITHIUM LEVEL: Lithium Lvl: 1.3 mmol/L — ABNORMAL HIGH (ref 0.6–1.2)

## 2021-09-15 NOTE — Telephone Encounter (Signed)
Attempted to contact [place] physicians on 09/12/21. Phones turned off at lunch. Called [facility] again and left message for Joellen [place] nurse regarding pt having had elevated Lithium and Creatinine levels since Lisinopril was initiated. Dose of Lithium was decreased and Cr and Li+ levels remain elevated and now pt is having breakthrough Bipolar s/s. Pt and this provider were wondering if it would be possible to switch Lisinopril to another anti-hypertensive since pt's Bipolar s/s, Cr, and Li+ levels were stable prior to initiation of Lisinopril. Left C/B number if PCP or nurse has questions.

## 2021-09-16 ENCOUNTER — Telehealth: Payer: Self-pay | Admitting: Psychiatry

## 2021-09-16 NOTE — Telephone Encounter (Signed)
Please let pt know that PCP's office was contacted about possibly changing Lisinopril as she and I discussed since Tracy Russo has had increases in Lithium and creatinine levels since Lisinopril was added, even with reducing Lithium. Recommend she see PCP at her earliest convenience and that we re-check labs 2-4 weeks after change in Lisinopril. Please advise her to call us if she experiences any worsening mood s/s or s/s of possible Lithium toxicity.

## 2021-09-16 NOTE — Telephone Encounter (Signed)
Al Decant at [facility] LVM for Shanda Bumps to tell her they can change the pt's Lisinopril, but they need to see the pt in office.  They wanted Shanda Bumps to know that they will call the patient to schedule her to come in to their office.

## 2021-09-16 NOTE — Telephone Encounter (Signed)
This may be more information than anything. Do I need to contact anyone for you?

## 2021-09-18 NOTE — Telephone Encounter (Signed)
Contacted pt and she is already set up to see her PCP next week.

## 2021-09-23 ENCOUNTER — Telehealth: Payer: Self-pay | Admitting: Psychiatry

## 2021-09-23 ENCOUNTER — Other Ambulatory Visit: Payer: Self-pay | Admitting: Psychiatry

## 2021-09-23 DIAGNOSIS — F319 Bipolar disorder, unspecified: Secondary | ICD-10-CM

## 2021-09-23 NOTE — Telephone Encounter (Signed)
I called her back to find out the name of the BP med.  Had to LVM.  Asked her to call back to the office and advise someone at the front desk and they can update the message.

## 2021-09-23 NOTE — Telephone Encounter (Signed)
Thank you lois

## 2021-09-23 NOTE — Telephone Encounter (Signed)
There is not adequate information in this message to be able to answer the question.  The name of the blood pressure medicine is not available.  What is the name of the blood pressure medicine that she is taking?

## 2021-09-23 NOTE — Telephone Encounter (Signed)
Please review

## 2021-09-23 NOTE — Telephone Encounter (Signed)
Pt called to advise that she went to her PCP and they do not want her to stop taking her BP med because her BP was so high, she could have had a stroke.  The PCP doesn't have a solution of something to take because other meds also have cautions for Lithium.  She  would like to know if you can tell her something else to do between now and her appt.  Appt is 12/6

## 2021-09-24 NOTE — Telephone Encounter (Signed)
It's Lisinopril 10mg 

## 2021-09-24 NOTE — Telephone Encounter (Signed)
She is taking lisinopril 10 mg

## 2021-09-24 NOTE — Telephone Encounter (Signed)
Lisinopril as long as it is not used in combination with HCTZ is not a contraindication for lithium.  It may increase the lithium levels by as much as 30% but that can be adjusted for by reducing the lithium by 25 to 30% if necessary and should be guided by blood levels of the lithium.  Her blood levels have been running in the high normal range recently.  If she has been on lisinopril during that period of time then the lithium dosage could be reduced as indicated.  If she is just starting lisinopril now and her last blood level was 1.3 then I would reduce the lithium by 25 to 30%.

## 2021-09-25 MED ORDER — LITHIUM CARBONATE 600 MG PO CAPS: 600.0000 mg | ORAL_CAPSULE | Freq: Every day | ORAL | 1 refills | Status: DC

## 2021-09-25 MED ORDER — LITHIUM CARBONATE 300 MG PO CAPS: 300.0000 mg | ORAL_CAPSULE | Freq: Every day | ORAL | 1 refills | Status: DC

## 2021-09-25 NOTE — Addendum Note (Signed)
Addended by: Derenda Mis on: 09/25/2021 10:21 AM   Modules accepted: Orders

## 2021-09-25 NOTE — Telephone Encounter (Signed)
Called pt to discuss medication and PCP's recommendation to continue with Lisinopril. She is currently taking Lithium 1200 mg QHS only. She reports that she has not had any manic s/s in the last few weeks. Discussed Dr. Alwyn Ren recommendation to further reduce Lithium. Recommend decreasing Lithium to 900 mg at HS and to contact office if she experiences any manic s/s. Pt verbalized understanding. She reports that she has 300 mg capsules remaining and will combine with 600 mg capsules.

## 2021-10-09 ENCOUNTER — Telehealth: Payer: Self-pay | Admitting: Psychiatry

## 2021-10-09 DIAGNOSIS — Z79899 Other long term (current) drug therapy: Secondary | ICD-10-CM

## 2021-10-09 NOTE — Telephone Encounter (Signed)
Please call pt to follow-up on how she is doing after decrease in Lithium.

## 2021-10-09 NOTE — Telephone Encounter (Signed)
Thanks for calling her. I would like to get a repeat Lithium level and BMP/Creatinine level at the lower dose at her convenience. Please let her know I sent an order to [facility].

## 2021-10-09 NOTE — Telephone Encounter (Signed)
LVM with information.

## 2021-10-09 NOTE — Telephone Encounter (Signed)
Patient states she feels like she is doing well, really hasn't noticed any difference. She said she has not had any manic symptoms.

## 2021-10-29 LAB — BASIC METABOLIC PANEL
BUN/Creatinine Ratio: 11 (calc) (ref 6–22)
BUN: 12 mg/dL (ref 7–25)
CO2: 26 mmol/L (ref 20–32)
Calcium: 9.8 mg/dL (ref 8.6–10.2)
Chloride: 106 mmol/L (ref 98–110)
Creat: 1.09 mg/dL — ABNORMAL HIGH (ref 0.50–0.97)
Glucose, Bld: 94 mg/dL (ref 65–139)
Potassium: 4.4 mmol/L (ref 3.5–5.3)
Sodium: 138 mmol/L (ref 135–146)

## 2021-10-29 LAB — LITHIUM LEVEL: Lithium Lvl: 0.9 mmol/L (ref 0.6–1.2)

## 2021-10-29 NOTE — Telephone Encounter (Signed)
Patient notified

## 2021-10-29 NOTE — Telephone Encounter (Signed)
Please let her know that Lithium level is now 0.9 and back in the range where it had been previously. Creatinine is slightly high but less than it was after start of Lisinopril, so we will continue to monitor it closely.

## 2021-11-02 ENCOUNTER — Other Ambulatory Visit: Payer: Self-pay | Admitting: Psychiatry

## 2021-11-02 DIAGNOSIS — F411 Generalized anxiety disorder: Secondary | ICD-10-CM

## 2021-11-02 DIAGNOSIS — F319 Bipolar disorder, unspecified: Secondary | ICD-10-CM

## 2021-11-04 ENCOUNTER — Other Ambulatory Visit: Payer: Self-pay | Admitting: Psychiatry

## 2021-11-04 ENCOUNTER — Other Ambulatory Visit: Payer: Self-pay

## 2021-11-04 ENCOUNTER — Encounter: Payer: Self-pay | Admitting: Psychiatry

## 2021-11-04 ENCOUNTER — Ambulatory Visit: Payer: 59 | Admitting: Psychiatry

## 2021-11-04 VITALS — BP 99/67 | HR 75

## 2021-11-04 DIAGNOSIS — F411 Generalized anxiety disorder: Secondary | ICD-10-CM

## 2021-11-04 DIAGNOSIS — F319 Bipolar disorder, unspecified: Secondary | ICD-10-CM | POA: Diagnosis not present

## 2021-11-04 MED ORDER — PROPRANOLOL HCL 10 MG PO TABS: 10.0000 mg | ORAL_TABLET | Freq: Two times a day (BID) | ORAL | 1 refills | Status: DC

## 2021-11-04 MED ORDER — LAMOTRIGINE 100 MG PO TABS: ORAL_TABLET | ORAL | 0 refills | Status: DC

## 2021-11-04 MED ORDER — ZIPRASIDONE HCL 20 MG PO CAPS: 20.0000 mg | ORAL_CAPSULE | Freq: Every morning | ORAL | 1 refills | Status: DC

## 2021-11-04 MED ORDER — GABAPENTIN 300 MG PO CAPS: 300.0000 mg | ORAL_CAPSULE | Freq: Every day | ORAL | 1 refills | Status: DC

## 2021-11-04 MED ORDER — ZIPRASIDONE HCL 60 MG PO CAPS: 60.0000 mg | ORAL_CAPSULE | Freq: Every day | ORAL | 1 refills | Status: DC

## 2021-11-04 MED ORDER — LITHIUM CARBONATE 300 MG PO CAPS: 900.0000 mg | ORAL_CAPSULE | Freq: Every day | ORAL | 0 refills | Status: DC

## 2021-11-04 MED ORDER — LAMOTRIGINE 25 MG PO TABS: ORAL_TABLET | ORAL | 0 refills | Status: DC

## 2021-11-04 NOTE — Telephone Encounter (Signed)
She has apt this morning  ?

## 2021-11-04 NOTE — Progress Notes (Signed)
Tracy Russo [id number] [birth year] 30 y.o.  Subjective:   Patient ID:  Tracy Russo is a 30 y.o. (DOB 1991-06-17) female.  Chief Complaint:  Chief Complaint  Patient presents with   Depression   Anxiety     HPI Tracy Russo presents to the office today for follow-up of Bipolar D/O. Reports some possible mild depression. Reports that she has been "less emotionally responsive at home." Has not been enjoying job as much recently with scheduling issues. Has been working longer hours to make up for holiday time. Energy and motivation have been the same. Has been "getting back into reading again." Distracted by phone at times. Has occ anxiety when driving the book mobile after not driving it for awhile. Some anxiety in response to schedule changes and no longer having a designated person communicating her schedule to her. Has noticed some OCD tendencies to include checking the appliances and is worried about them being on after she leaves home. Also checking locks/doors. Awakening several times a night to urinate. Sleep is adequate overall. Appetite has been less than usual. Denies any manic s/s.   Was taking a pottery class and then did not want to do it since she felt like she was not progressing and not enjoying the instructor. Continues to enjoy time with family and friends. Looking forward to some holiday gatherings.   Denies SI.   Does not plan on trying to conceive.   Past Psychiatric Medication Trials: Abilify-ineffective Zyprexa-ineffective Geodon- effective Gabapentin Depakote Lithium Gabapentin Hydroxyzine Propanolol Trazodone    AIMS    Flowsheet Row Office Visit from 11/04/2021 in [facility] Office Visit from 05/06/2021 in [facility] Office Visit from 11/04/2020 in [facility] Office Visit from 05/11/2019 in [facility]  AIMS Total Score 0 0 0 0        Review of Systems:  Review of Systems   Genitourinary:  Positive for frequency.  Musculoskeletal:  Negative for gait problem.  Neurological:  Negative for dizziness, tremors and light-headedness.  Psychiatric/Behavioral:         Please refer to HPI   Medications: I have reviewed the patient's current medications.  Current Outpatient Medications  Medication Sig Dispense Refill   etonogestrel (NEXPLANON) 68 MG IMPL implant See admin instructions.     [START ON 12/02/2021] lamoTRIgine (LAMICTAL) 100 MG tablet Take 1 tab po qd x 2 weeks, then increase to 1.5 tabs po qd 45 tablet 0   lamoTRIgine (LAMICTAL) 25 MG tablet Take 1 tablet (25 mg total) by mouth daily for 14 days, THEN 2 tablets (50 mg total) daily for 14 days. 45 tablet 0   lisinopril (ZESTRIL) 10 MG tablet 1 tablet     gabapentin (NEURONTIN) 300 MG capsule Take 1 capsule (300 mg total) by mouth at bedtime. 90 capsule 1   lithium carbonate 300 MG capsule Take 3 capsules (900 mg total) by mouth at bedtime. Take with a 600 mg capsule to equal 900 mg 270 capsule 0   propranolol (INDERAL) 10 MG tablet Take 1 tablet (10 mg total) by mouth 2 (two) times daily. 180 tablet 1   ziprasidone (GEODON) 20 MG capsule Take 1 capsule (20 mg total) by mouth every morning. 90 capsule 1   ziprasidone (GEODON) 60 MG capsule Take 1 capsule (60 mg total) by mouth at bedtime. 90 capsule 1   No current facility-administered medications for this visit.    Medication Side Effects: None  Allergies: No Known Allergies  Past Medical History:  Diagnosis Date   HTN (hypertension)     Past Medical History, Surgical history, Social history, and Family history were reviewed and updated as appropriate.   Please see review of systems for further details on the patient's review from today.   Objective:   Physical Exam:  BP 99/67   Pulse 75   Physical Exam Constitutional:      General: She is not in acute distress. Musculoskeletal:        General: No deformity.  Neurological:     Mental  Status: She is alert and oriented to person, place, and time.     Coordination: Coordination normal.  Psychiatric:        Attention and Perception: Attention and perception normal. She does not perceive auditory or visual hallucinations.        Mood and Affect: Mood is anxious and depressed. Affect is not labile, blunt, angry or inappropriate.        Speech: Speech normal.        Behavior: Behavior normal.        Thought Content: Thought content normal. Thought content is not paranoid or delusional. Thought content does not include homicidal or suicidal ideation. Thought content does not include homicidal or suicidal plan.        Cognition and Memory: Cognition and memory normal.        Judgment: Judgment normal.     Comments: Insight intact    Lab Review:     Component Value Date/Time   NA 138 10/28/2021 0946   K 4.4 10/28/2021 0946   CL 106 10/28/2021 0946   CO2 26 10/28/2021 0946   GLUCOSE 94 10/28/2021 0946   BUN 12 10/28/2021 0946   CREATININE 1.09 (H) 10/28/2021 0946   CALCIUM 9.8 10/28/2021 0946   PROT 6.5 10/30/2016 2144   ALBUMIN 3.8 10/30/2016 2144   AST 23 10/30/2016 2144   ALT 17 10/30/2016 2144   ALKPHOS 44 10/30/2016 2144   BILITOT 0.4 10/30/2016 2144   GFRNONAA >60 10/30/2016 2144   GFRAA >60 10/30/2016 2144       Component Value Date/Time   WBC 5.2 10/30/2016 2144   RBC 4.24 10/30/2016 2144   HGB 12.9 10/30/2016 2144   HCT 37.1 10/30/2016 2144   PLT 179 10/30/2016 2144   MCV 87.5 10/30/2016 2144   MCH 30.4 10/30/2016 2144   MCHC 34.8 10/30/2016 2144   RDW 12.9 10/30/2016 2144    Lithium Lvl  Date Value Ref Range Status  10/28/2021 0.9 0.6 - 1.2 mmol/L Final     No results found for: PHENYTOIN, PHENOBARB, VALPROATE, CBMZ   .res Assessment: Plan:    Pt seen for 30 minutes and time spent discussing possible treatment options since she has noticed some increase in mood s/s since decrease in Lithium dosage after increase in Creatinine and Lithium  level following initiation of Lisinopril. Discussed option of increasing Geodon, however she is concerned about increased drowsiness since she may be experiencing some mild fatigue/drowsiness in response to current dose.  Counseled patient regarding potential benefits, risks, and side effects of Lamictal to include potential risk of Stevens-Johnson syndrome. Advised patient to stop taking Lamictal and contact office immediately if rash develops and to seek urgent medical attention if rash is severe and/or spreading quickly. Discussed that Lamictal is indicated for Bipolar depression and off label for OCD. Will start Lamictal 25 mg daily for 2 weeks, then increase to 50 mg daily for 2 weeks, then  100 mg daily for 2 weeks, then 150 mg daily for mood symptoms.  Continue Lithium 900 mg po QHS for mood stabilization.  Continue Propranolol 10 mg po BID for tremor and anxiety.  Continue Geodon 20 mg po q am and 60 mg po QHS for mood stabilization.  Pt to follow-up in 2 months or sooner if clinically indicated.  Patient advised to contact office with any questions, adverse effects, or acute worsening in signs and symptoms.    Tracy Russo was seen today for depression and anxiety.  Diagnoses and all orders for this visit:  Bipolar I disorder (HCC) -     lamoTRIgine (LAMICTAL) 25 MG tablet; Take 1 tablet (25 mg total) by mouth daily for 14 days, THEN 2 tablets (50 mg total) daily for 14 days. -     lamoTRIgine (LAMICTAL) 100 MG tablet; Take 1 tab po qd x 2 weeks, then increase to 1.5 tabs po qd -     lithium carbonate 300 MG capsule; Take 3 capsules (900 mg total) by mouth at bedtime. Take with a 600 mg capsule to equal 900 mg -     ziprasidone (GEODON) 20 MG capsule; Take 1 capsule (20 mg total) by mouth every morning. -     ziprasidone (GEODON) 60 MG capsule; Take 1 capsule (60 mg total) by mouth at bedtime.  Anxiety state -     propranolol (INDERAL) 10 MG tablet; Take 1 tablet (10 mg total) by mouth 2 (two)  times daily. -     gabapentin (NEURONTIN) 300 MG capsule; Take 1 capsule (300 mg total) by mouth at bedtime.    Please see After Visit Summary for patient specific instructions.  Future Appointments  Date Time Provider Department Center  01/05/2022  9:15 AM Corie Chiquito, PMHNP [facility] None    No orders of the defined types were placed in this encounter.   -------------------------------

## 2021-11-06 ENCOUNTER — Other Ambulatory Visit: Payer: Self-pay

## 2021-11-06 DIAGNOSIS — F319 Bipolar disorder, unspecified: Secondary | ICD-10-CM

## 2021-11-06 MED ORDER — LITHIUM CARBONATE 300 MG PO CAPS: 900.0000 mg | ORAL_CAPSULE | Freq: Every day | ORAL | 0 refills | Status: DC

## 2021-12-12 ENCOUNTER — Telehealth: Payer: Self-pay | Admitting: Psychiatry

## 2021-12-12 NOTE — Telephone Encounter (Signed)
Pt stated these problems started on Wednesday last week.She is not sure if it's related to the increase in Lamictal,currently taking 100 mg daily.

## 2021-12-12 NOTE — Telephone Encounter (Signed)
Tracy Russo is having digestive problems, diarrhea and is nauseous and wonders if it could be one of the medications she is taking? Her phone number is [phone].

## 2021-12-12 NOTE — Telephone Encounter (Signed)
Please let her know that these are not typical side effects of Lamictal. Recommend continuing 100 mg until GI s/s resolve before increasing to 150 mg po qd.

## 2021-12-15 NOTE — Telephone Encounter (Signed)
PT informed

## 2022-01-05 ENCOUNTER — Other Ambulatory Visit: Payer: Self-pay | Admitting: Psychiatry

## 2022-01-05 ENCOUNTER — Telehealth: Payer: Self-pay | Admitting: Psychiatry

## 2022-01-05 ENCOUNTER — Ambulatory Visit: Payer: 59 | Admitting: Psychiatry

## 2022-01-05 DIAGNOSIS — F319 Bipolar disorder, unspecified: Secondary | ICD-10-CM

## 2022-01-05 NOTE — Telephone Encounter (Signed)
Left pt detailed message ok to change to the morning to see if that helps with sleep. Instructed to call back with an update or worsening

## 2022-01-05 NOTE — Telephone Encounter (Signed)
Had apt today. RS-provider out. Next apt 3/1. Pt having issues sleeping since increase dose of Lamotrigine to 150 mg. Requesting to take in the morning to see if may help with sleep. Contact # [phone].

## 2022-01-09 ENCOUNTER — Other Ambulatory Visit: Payer: Self-pay

## 2022-01-09 ENCOUNTER — Emergency Department
Admission: EM | Admit: 2022-01-09 | Discharge: 2022-01-09 | Disposition: A | Discharge status: Home / Self Care | Payer: Managed Care, Other (non HMO) | Source: Home / Self Care

## 2022-01-09 ENCOUNTER — Encounter: Payer: Self-pay | Admitting: Emergency Medicine

## 2022-01-09 ENCOUNTER — Emergency Department (INDEPENDENT_AMBULATORY_CARE_PROVIDER_SITE_OTHER): Payer: Managed Care, Other (non HMO)

## 2022-01-09 DIAGNOSIS — J3089 Other allergic rhinitis: Secondary | ICD-10-CM

## 2022-01-09 DIAGNOSIS — R059 Cough, unspecified: Secondary | ICD-10-CM

## 2022-01-09 MED ORDER — FEXOFENADINE HCL 180 MG PO TABS: 180.0000 mg | ORAL_TABLET | Freq: Every day | ORAL | 0 refills | Status: DC

## 2022-01-09 MED ORDER — BENZONATATE 200 MG PO CAPS: 200.0000 mg | ORAL_CAPSULE | Freq: Three times a day (TID) | ORAL | 0 refills | Status: AC | PRN

## 2022-01-09 MED ORDER — DOXYCYCLINE HYCLATE 100 MG PO CAPS: 100.0000 mg | ORAL_CAPSULE | Freq: Two times a day (BID) | ORAL | 0 refills | Status: AC

## 2022-01-09 NOTE — ED Triage Notes (Signed)
Patient presents to Urgent Care with complaints of cough since 6-8 weeks. Patient reports productive cough and some lightheaded. She has not been sleeping well due to the cough at night. Has noticed some shortness of breath or winded. Did try Delsym. Denies any fever or chills

## 2022-01-09 NOTE — Discharge Instructions (Addendum)
Advised patient chest x-ray was clear with no acute cardiopulmonary process.  Advised patient to take medication as directed with food to completion.  Advised patient to take Allegra with first dose of doxycycline for the next 5 of 7 days.  Advised may use Allegra afterwards as needed for concurrent postnasal drainage/drip.  Advised patient may take Tessalon Perles daily or as needed for cough.  Encouraged patient to increase daily water intake while taking these medications.

## 2022-01-09 NOTE — ED Provider Notes (Signed)
[facility]    CSN: [id number] Arrival date & time: 01/09/22  1413      History   Chief Complaint Chief Complaint  Patient presents with   Cough    HPI Tracy Russo is a 31 y.o. female.   HPI 31 year old female presents with cough for 6 to 8 weeks.  Reports productive at times causing her to be lightheaded.  Patient reports not sleeping well at night due to cough..  Reports has noticed some shortness of breath and being winded.  PMH significant for HTN and bipolar disorder.  Past Medical History:  Diagnosis Date   HTN (hypertension)     Patient Active Problem List   Diagnosis Date Noted   Bipolar disorder (HCC) 11/03/2016    Past Surgical History:  Procedure Laterality Date   THYROID CYST EXCISION      OB History   No obstetric history on file.      Home Medications    Prior to Admission medications   Medication Sig Start Date End Date Taking? Authorizing Provider  benzonatate (TESSALON) 200 MG capsule Take 1 capsule (200 mg total) by mouth 3 (three) times daily as needed for up to 7 days for cough. 01/09/22 01/16/22 Yes Trevor Iha, FNP  doxycycline (VIBRAMYCIN) 100 MG capsule Take 1 capsule (100 mg total) by mouth 2 (two) times daily for 7 days. 01/09/22 01/16/22 Yes Trevor Iha, FNP  etonogestrel (NEXPLANON) 68 MG IMPL implant See admin instructions.   Yes [provider]  fexofenadine (ALLEGRA ALLERGY) 180 MG tablet Take 1 tablet (180 mg total) by mouth daily for 15 days. 01/09/22 01/24/22 Yes Trevor Iha, FNP  gabapentin (NEURONTIN) 300 MG capsule Take 1 capsule (300 mg total) by mouth at bedtime. 11/04/21 02/02/22 Yes Corie Chiquito, PMHNP  lamoTRIgine (LAMICTAL) 100 MG tablet TAKE 1.5 TABLETS DAILY 01/06/22  Yes Corie Chiquito, PMHNP  lisinopril (ZESTRIL) 10 MG tablet 1 tablet 08/05/21  Yes [provider]  lithium carbonate 300 MG capsule Take 3 capsules (900 mg total) by mouth at bedtime. Take with a 600 mg capsule to equal  900 mg 11/06/21 02/04/22 Yes Corie Chiquito, PMHNP  propranolol (INDERAL) 10 MG tablet Take 1 tablet (10 mg total) by mouth 2 (two) times daily. 11/04/21 02/02/22 Yes Corie Chiquito, PMHNP  ziprasidone (GEODON) 20 MG capsule Take 1 capsule (20 mg total) by mouth every morning. 11/04/21 02/02/22 Yes Corie Chiquito, PMHNP  ziprasidone (GEODON) 60 MG capsule Take 1 capsule (60 mg total) by mouth at bedtime. 11/04/21  Yes Corie Chiquito, PMHNP  lamoTRIgine (LAMICTAL) 25 MG tablet Take 1 tablet (25 mg total) by mouth daily for 14 days, THEN 2 tablets (50 mg total) daily for 14 days. 11/04/21 12/02/21  Corie Chiquito, PMHNP    Family History Family History  Problem Relation Age of Onset   Bipolar disorder Mother    Skin cancer Mother    Hypertension Father     Social History Social History   Tobacco Use   Smoking status: Never   Smokeless tobacco: Never  Substance Use Topics   Alcohol use: Yes    Comment: occasional drinker, less than 2 drinks per week   Drug use: Never     Allergies   Patient has no known allergies.   Review of Systems Review of Systems  Respiratory:  Positive for cough.     Physical Exam Triage Vital Signs ED Triage Vitals  Enc Vitals Group     BP 01/09/22 1428 115/77  Pulse Rate 01/09/22 1428 86     Resp 01/09/22 1428 16     Temp 01/09/22 1428 98.3 F (36.8 C)     Temp Source 01/09/22 1428 Oral     SpO2 01/09/22 1428 97 %     Weight 01/09/22 1426 195 lb (88.5 kg)     Height --      Head Circumference --      Peak Flow --      Pain Score 01/09/22 1426 0     Pain Loc --      Pain Edu? --      Excl. in GC? --    No data found.  Updated Vital Signs BP 115/77 (BP Location: Right Arm)    Pulse 86    Temp 98.3 F (36.8 C) (Oral)    Resp 16    Wt 195 lb (88.5 kg)    SpO2 97%    BMI 30.54 kg/m    Physical Exam Vitals and nursing note reviewed.  Constitutional:      General: She is not in acute distress.    Appearance: Normal appearance. She is  obese. She is not ill-appearing.  HENT:     Head: Normocephalic and atraumatic.     Right Ear: Tympanic membrane, ear canal and external ear normal.     Left Ear: Tympanic membrane, ear canal and external ear normal.     Mouth/Throat:     Mouth: Mucous membranes are moist.     Pharynx: Oropharynx is clear.     Comments: Moderate amount of clear drainage of posterior oropharynx noted. Eyes:     Extraocular Movements: Extraocular movements intact.     Conjunctiva/sclera: Conjunctivae normal.     Pupils: Pupils are equal, round, and reactive to light.  Cardiovascular:     Rate and Rhythm: Normal rate and regular rhythm.     Pulses: Normal pulses.     Heart sounds: Normal heart sounds.  Pulmonary:     Effort: Pulmonary effort is normal.     Breath sounds: Normal breath sounds. No wheezing, rhonchi or rales.  Musculoskeletal:     Cervical back: Normal range of motion and neck supple.  Skin:    General: Skin is warm and dry.  Neurological:     General: No focal deficit present.     Mental Status: She is alert and oriented to person, place, and time. Mental status is at baseline.     UC Treatments / Results  Labs (all labs ordered are listed, but only abnormal results are displayed) Labs Reviewed - No data to display  EKG   Radiology DG Chest 2 View  Result Date: 01/09/2022 CLINICAL DATA:  Cough EXAM: CHEST - 2 VIEW COMPARISON:  None. FINDINGS: The heart size and mediastinal contours are within normal limits. Both lungs are clear. The visualized skeletal structures are unremarkable. IMPRESSION: No active cardiopulmonary disease. Electronically Signed   By: Allegra Lai M.D.   On: 01/09/2022 14:51    Procedures Procedures (including critical care time)  Medications Ordered in UC Medications - No data to display  Initial Impression / Assessment and Plan / UC Course  I have reviewed the triage vital signs and the nursing notes.  Pertinent labs & imaging results that were  available during my care of the patient were reviewed by me and considered in my medical decision making (see chart for details).     MDM: 1.  Cough-Rx'd Doxycycline and Tessalon Perles; 2.  Allergic rhinitis-Rx'd  Allegra. Advised patient chest x-ray was clear with no acute cardiopulmonary process.  Advised patient to take medication as directed with food to completion.  Advised patient to take Allegra with first dose of doxycycline for the next 5 of 7 days.  Advised may use Allegra afterwards as needed for concurrent postnasal drainage/drip.  Advised patient may take Tessalon Perles daily or as needed for cough.  Encouraged patient to increase daily water intake while taking these medications.  Patient discharged home, hemodynamically stable. Final Clinical Impressions(s) / UC Diagnoses   Final diagnoses:  Cough, unspecified type  Allergic rhinitis due to other allergic trigger, unspecified seasonality     Discharge Instructions      Advised patient chest x-ray was clear with no acute cardiopulmonary process.  Advised patient to take medication as directed with food to completion.  Advised patient to take Allegra with first dose of doxycycline for the next 5 of 7 days.  Advised may use Allegra afterwards as needed for concurrent postnasal drainage/drip.  Advised patient may take Tessalon Perles daily or as needed for cough.  Encouraged patient to increase daily water intake while taking these medications.     ED Prescriptions     Medication Sig Dispense Auth. Provider   doxycycline (VIBRAMYCIN) 100 MG capsule Take 1 capsule (100 mg total) by mouth 2 (two) times daily for 7 days. 14 capsule Trevor Iha, FNP   fexofenadine [facility] ALLERGY) 180 MG tablet Take 1 tablet (180 mg total) by mouth daily for 15 days. 15 tablet Trevor Iha, FNP   benzonatate (TESSALON) 200 MG capsule Take 1 capsule (200 mg total) by mouth 3 (three) times daily as needed for up to 7 days for cough. 40 capsule  Trevor Iha, FNP      PDMP not reviewed this encounter.   Trevor Iha, FNP 01/09/22 1537

## 2022-01-28 ENCOUNTER — Other Ambulatory Visit: Payer: Self-pay

## 2022-01-28 ENCOUNTER — Ambulatory Visit: Payer: 59 | Admitting: Psychiatry

## 2022-01-28 ENCOUNTER — Encounter: Payer: Self-pay | Admitting: Psychiatry

## 2022-01-28 VITALS — BP 97/62 | HR 68

## 2022-01-28 DIAGNOSIS — F319 Bipolar disorder, unspecified: Secondary | ICD-10-CM

## 2022-01-28 DIAGNOSIS — F411 Generalized anxiety disorder: Secondary | ICD-10-CM | POA: Diagnosis not present

## 2022-01-28 MED ORDER — LAMOTRIGINE 150 MG PO TABS: 150.0000 mg | ORAL_TABLET | Freq: Every day | ORAL | 0 refills | Status: DC

## 2022-01-28 MED ORDER — LITHIUM CARBONATE 600 MG PO CAPS: 600.0000 mg | ORAL_CAPSULE | Freq: Every day | ORAL | 0 refills | Status: DC

## 2022-01-28 NOTE — Progress Notes (Signed)
Tracy Russo ?[id number] ?1990/12/04 ?31 y.o. ? ?Subjective:  ? ?Patient ID:  Tracy Russo is a 31 y.o. (DOB [birth year]) female. ? ?Chief Complaint:  ?Chief Complaint  ?Patient presents with  ? Follow-up  ?  Bipolar D/O and anxiety  ? ? ?HPI ?Tracy Russo presents to the office today for follow-up of Bipolar D/O and anxiety.  She noticed some mild manic s/s for a few days after increase in Lamictal to 150 mg and these s/s have resolved. ? ?She reports that she has been more focused and productive at work. She reports that she continues to distracted by her phone and can set it aside at times. She reports that she has noticed that she is less bothered by conflict. She denies depressed mood and reports that depression scales at therapist's office indicated decreased depression. She reports that she no longer feels less emotionally responsive at home. She reports that her energy is "up and down." She has been walking with coworker and this helps with her energy. Motivation has been good and is working ahead at work. Appetite has been ok. Has been snacking more. Denies any change in weight. Enjoying work more now. Denies SI.  ? ?She reports that her anxiety has been improved. She reports that she has been less compulsive about checking doors. She reports that she has had some anxiety at work recently in response to increase stress. 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  ? ?She reports that she had a chronic cough that was disrupting sleep and sleep is improving now.  ? ?She reports that her job may become full-time. Currently works 30 hours a week.  ? ?Past Psychiatric Medication Trials: ?Abilify-ineffective ?Zyprexa-ineffective ?Geodon- effective ?Gabapentin ?Depakote ?Lithium ?Gabapentin ?Hydroxyzine ?Propanolol ?Trazodone ? ?AIMS   ? ?[place] Office Visit from 11/04/2021 in [place] Visit from 05/06/2021 in [place] Visit from 11/04/2020 in  [place] Office Visit from 05/11/2019 in [place]  ?AIMS Total Score 0 0 0 0  ? ?  ? ?[place] ED from 01/09/2022 in [facility] [place]  ?C-SSRS RISK CATEGORY No Risk  ? ?  ?  ? ?Review of Systems:  ?Review of Systems  ?Respiratory:  Positive for cough.   ?Genitourinary:  Positive for frequency.  ?Musculoskeletal:  Negative for gait problem.  ?Skin:  Negative for rash.  ?Neurological:  Negative for dizziness, tremors and light-headedness.  ?Psychiatric/Behavioral:    ?     Please refer to HPI  ? ?Medications: I have reviewed the patient's current medications. ? ?Current Outpatient Medications  ?Medication Sig Dispense Refill  ? benzonatate (TESSALON) 100 MG capsule Take by mouth 3 (three) times daily as needed for cough.    ? etonogestrel (NEXPLANON) 37 MG IMPL implant See admin instructions.    ? gabapentin (NEURONTIN) 300 MG capsule Take 1 capsule (300 mg total) by mouth at bedtime. 90 capsule 1  ? lisinopril (ZESTRIL) 10 MG tablet 1 tablet    ? propranolol (INDERAL) 10 MG tablet Take 1 tablet (10 mg total) by mouth 2 (two) times daily. 180 tablet 1  ? ziprasidone (GEODON) 20 MG capsule Take 1 capsule (20 mg total) by mouth every morning. 90 capsule 1  ? ziprasidone (GEODON) 60 MG capsule Take 1 capsule (60 mg total) by mouth at bedtime. 90 capsule 1  ? fexofenadine (ALLEGRA ALLERGY) 180 MG tablet Take 1 tablet (180 mg total) by mouth  daily for 15 days. 15 tablet 0  ? lamoTRIgine (LAMICTAL) 150 MG tablet Take 1 tablet (150 mg total) by mouth daily. 90 tablet 0  ? lithium carbonate 600 MG capsule Take 1 capsule (600 mg total) by mouth at bedtime. 90 capsule 0  ? ?No current facility-administered medications for this visit.  ? ? ?Medication Side Effects: Other: Urinary frequency ? ?Allergies: No Known Allergies ? ?Past Medical History:  ?Diagnosis Date  ? HTN (hypertension)   ? ? ?Past Medical History, Surgical history, Social history, and Family  history were reviewed and updated as appropriate.  ? ?Please see review of systems for further details on the patient's review from today.  ? ?Objective:  ? ?Physical Exam:  ?BP 97/62   Pulse 68  ? ?Physical Exam ?Constitutional:   ?   General: She is not in acute distress. ?Musculoskeletal:     ?   General: No deformity.  ?Neurological:  ?   Mental Status: She is alert and oriented to person, place, and time.  ?   Coordination: Coordination normal.  ?Psychiatric:     ?   Attention and Perception: Attention and perception normal. She does not perceive auditory or visual hallucinations.     ?   Mood and Affect: Mood normal. Mood is not anxious or depressed. Affect is not labile, blunt, angry or inappropriate.     ?   Speech: Speech normal.     ?   Behavior: Behavior normal.     ?   Thought Content: Thought content normal. Thought content is not paranoid or delusional. Thought content does not include homicidal or suicidal ideation. Thought content does not include homicidal or suicidal plan.     ?   Cognition and Memory: Cognition and memory normal.     ?   Judgment: Judgment normal.  ?   Comments: Insight intact  ? ? ?Lab Review:  ?   ?Component Value Date/Time  ? NA 138 10/28/2021 0946  ? K 4.4 10/28/2021 0946  ? CL 106 10/28/2021 0946  ? CO2 26 10/28/2021 0946  ? GLUCOSE 94 10/28/2021 0946  ? BUN 12 10/28/2021 0946  ? CREATININE 1.09 (H) 10/28/2021 0946  ? CALCIUM 9.8 10/28/2021 0946  ? PROT 6.5 10/30/2016 2144  ? ALBUMIN 3.8 10/30/2016 2144  ? AST 23 10/30/2016 2144  ? ALT 17 10/30/2016 2144  ? ALKPHOS 44 10/30/2016 2144  ? BILITOT 0.4 10/30/2016 2144  ? GFRNONAA >60 10/30/2016 2144  ? GFRAA >60 10/30/2016 2144  ? ? ?   ?Component Value Date/Time  ? WBC 5.2 10/30/2016 2144  ? RBC 4.24 10/30/2016 2144  ? HGB 12.9 10/30/2016 2144  ? HCT 37.1 10/30/2016 2144  ? PLT 179 10/30/2016 2144  ? MCV 87.5 10/30/2016 2144  ? MCH 30.4 10/30/2016 2144  ? MCHC 34.8 10/30/2016 2144  ? RDW 12.9 10/30/2016 2144  ? ? ?Lithium Lvl   ?Date Value Ref Range Status  ?10/28/2021 0.9 0.6 - 1.2 mmol/L Final  ?  ? ?No results found for: PHENYTOIN, PHENOBARB, VALPROATE, CBMZ  ? ?.res ?Assessment: Plan:   ? ?Pt seen for 30 minutes and time spent discussing response to initiation of Lamictal. Discussed that mood and anxiety s/s seem to have improved with Lamictal. Discussed option of attempting dose reduction of Lithium since Creatinine level has been mildly elevated. Pt agrees to dose reduction in Lithium to 600 mg at bedtime. Advised to call office and resume previous dose if she experiences worsening mood  s/s.  ?Continue Lamictal 150 mg po qd for mood s/s.  ?Continue Geodon 20 mg po q am and 60 mg at bedtime for mood s/s.  ?Continue Propranolol 10 mg po BID for anxiety.  ?Continue Gabapentin 300 mg po QHS for anxiety and insomnia.  ?Recommend continuing therapy. ?Pt to follow-up in 2 months or sooner if clinically indicated.  ?Patient advised to contact office with any questions, adverse effects, or acute worsening in signs and symptoms. ? ? ?Terryl was seen today for follow-up. ? ?Diagnoses and all orders for this visit: ? ?Bipolar I disorder (HCC) ?-     lithium carbonate 600 MG capsule; Take 1 capsule (600 mg total) by mouth at bedtime. ?-     lamoTRIgine (LAMICTAL) 150 MG tablet; Take 1 tablet (150 mg total) by mouth daily. ? ?Anxiety state ? ?  ? ?Please see After Visit Summary for patient specific instructions. ? ?Future Appointments  ?Date Time Provider [place]  ?03/31/2022 10:00 AM Thayer Headings, PMHNP CP-CP None  ? ? ?No orders of the defined types were placed in this encounter. ? ? ?------------------------------- ?

## 2022-01-30 ENCOUNTER — Other Ambulatory Visit: Payer: Self-pay | Admitting: Psychiatry

## 2022-01-30 DIAGNOSIS — F319 Bipolar disorder, unspecified: Secondary | ICD-10-CM

## 2022-01-31 ENCOUNTER — Other Ambulatory Visit: Payer: Self-pay | Admitting: Psychiatry

## 2022-01-31 DIAGNOSIS — F319 Bipolar disorder, unspecified: Secondary | ICD-10-CM

## 2022-02-02 LAB — BASIC METABOLIC PANEL
BUN: 12 (ref 4–21)
CO2: 29 — AB (ref 13–22)
Chloride: 105 (ref 99–108)
Creatinine: 1.1 (ref 0.5–1.1)
Glucose: 97
Potassium: 4.4 mEq/L (ref 3.5–5.1)
Sodium: 139 (ref 137–147)

## 2022-02-02 LAB — LIPID PANEL
Cholesterol: 153 (ref 0–200)
HDL: 34 — AB (ref 35–70)
LDL Cholesterol: 100
LDl/HDL Ratio: 4.5
Triglycerides: 102 (ref 40–160)

## 2022-02-02 LAB — HEPATIC FUNCTION PANEL
ALT: 12 U/L (ref 7–35)
AST: 17 (ref 13–35)
Alkaline Phosphatase: 72 (ref 25–125)
Bilirubin, Total: 0.3

## 2022-02-02 LAB — COMPREHENSIVE METABOLIC PANEL
Albumin: 4.3 (ref 3.5–5.0)
Calcium: 9.7 (ref 8.7–10.7)
eGFR: 68

## 2022-02-26 ENCOUNTER — Ambulatory Visit: Payer: Managed Care, Other (non HMO) | Admitting: Podiatry

## 2022-02-26 ENCOUNTER — Other Ambulatory Visit: Payer: Self-pay | Admitting: *Deleted

## 2022-02-26 ENCOUNTER — Encounter: Payer: Self-pay | Admitting: Podiatry

## 2022-02-26 DIAGNOSIS — B351 Tinea unguium: Secondary | ICD-10-CM | POA: Diagnosis not present

## 2022-02-26 DIAGNOSIS — F419 Anxiety disorder, unspecified: Secondary | ICD-10-CM | POA: Insufficient documentation

## 2022-02-26 DIAGNOSIS — I10 Essential (primary) hypertension: Secondary | ICD-10-CM | POA: Insufficient documentation

## 2022-02-26 NOTE — Progress Notes (Signed)
?  Subjective:  ?Patient ID: Tracy Russo, female    DOB: May 09, 1991,   MRN: [id number] ? ?Chief Complaint  ?Patient presents with  ? Nail Problem  ?  The left big toenail is thick and discolored and does rub against the 2nd toe on the left and I went to the doctor and was given lamisil and I could not tell a difference and does not drain and has been going on since last summer   ? ? ?31 y.o. female presents for concern of thickened and deformed left great toenail that is ingrowing. This started last summer. She went to her PCP for fungus and was given Lamisil but not sure if that made a difference. Relates she started the course back in September.  Relates left toe rubs against second toe.  . Denies any other pedal complaints. Denies n/v/f/c.  ? ?Past Medical History:  ?Diagnosis Date  ? HTN (hypertension)   ? ? ?Objective:  ?Physical Exam: ?Vascular: DP/PT pulses 2/4 bilateral. CFT <3 seconds. Normal hair growth on digits. No edema.  ?Skin. No lacerations or abrasions bilateral feet. Left hallux nail thickened and discolored distally. New nail growing in appears to be normal in appearance. Some discoloration noted to distal right hallux as well.  ?Musculoskeletal: MMT 5/5 bilateral lower extremities in DF, PF, Inversion and Eversion. Deceased ROM in DF of ankle joint.  ?Neurological: Sensation intact to light touch.  ? ?Assessment:  ? ?1. Onychomycosis   ? ? ? ?Plan:  ?Patient was evaluated and treated and all questions answered. ?-Examined patient ?-Discussed treatment options for painful dystrophic nails  ?-Hallux nail debrided back to normal healthy appearing nail as courtesy.  ?-Discussed fungal nail treatment options including oral, topical, and laser treatments.  ?-Will plan to see how nail grows out.  ?-Patient to return if any worsening or no improvement noted in nails.  ? ? ? ?Louann Sjogren, DPM  ? ? ?

## 2022-03-31 ENCOUNTER — Ambulatory Visit (INDEPENDENT_AMBULATORY_CARE_PROVIDER_SITE_OTHER): Payer: 59 | Admitting: Psychiatry

## 2022-03-31 ENCOUNTER — Encounter: Payer: Self-pay | Admitting: Psychiatry

## 2022-03-31 VITALS — BP 93/63 | HR 59

## 2022-03-31 DIAGNOSIS — F319 Bipolar disorder, unspecified: Secondary | ICD-10-CM | POA: Diagnosis not present

## 2022-03-31 DIAGNOSIS — Z79899 Other long term (current) drug therapy: Secondary | ICD-10-CM

## 2022-03-31 DIAGNOSIS — F411 Generalized anxiety disorder: Secondary | ICD-10-CM | POA: Diagnosis not present

## 2022-03-31 MED ORDER — ZIPRASIDONE HCL 20 MG PO CAPS: 20.0000 mg | ORAL_CAPSULE | Freq: Every morning | ORAL | 1 refills | Status: DC

## 2022-03-31 MED ORDER — ZIPRASIDONE HCL 60 MG PO CAPS: 60.0000 mg | ORAL_CAPSULE | Freq: Every day | ORAL | 1 refills | Status: DC

## 2022-03-31 MED ORDER — LAMOTRIGINE 150 MG PO TABS: 150.0000 mg | ORAL_TABLET | Freq: Every day | ORAL | 0 refills | Status: DC

## 2022-03-31 MED ORDER — GABAPENTIN 300 MG PO CAPS: 300.0000 mg | ORAL_CAPSULE | Freq: Every day | ORAL | 1 refills | Status: DC

## 2022-03-31 MED ORDER — LITHIUM CARBONATE 300 MG PO CAPS: 300.0000 mg | ORAL_CAPSULE | Freq: Every day | ORAL | 1 refills | Status: DC

## 2022-03-31 MED ORDER — PROPRANOLOL HCL 10 MG PO TABS: ORAL_TABLET | ORAL | 1 refills | Status: DC

## 2022-03-31 NOTE — Progress Notes (Signed)
Tracy Russo ?[id number] ?14-Nov-1991 ?31 y.o. ? ?Subjective:  ? ?Patient ID:  Tracy Russo is a 31 y.o. (DOB [birth year]) female. ? ?Chief Complaint:  ?Chief Complaint  ?Patient presents with  ? Follow-up  ?  Bipolar d/o, anxiety, insomnia  ? ? ?HPI ?Tracy Russo presents to the office today for follow-up of mood disturbance, anxiety, and insomnia. "I feel like I have been doing really well. Even my anxiety is better." She has stopped caffeine other than first thing in the morning. She reports that she has been shopping more in the last month and is not sure if it is sue to the change of seasons and with upcoming birthday. Denies any other impulsive behavior. She reports that her mood has been "happy" and has occ irritable. Has been emotional with watching certain TV shows and reading certain books. She has not been crying as much when she and her husband have disagreements. Denies sad moods. She reports that she has been working on delaying spending. Sleep has improved with decreasing caffeine. Some difficulty falling asleep. Motivation has been somewhat low at work. Energy has been good. Energy is not excessive. Denies risky behavior. Appetite was increased when she started Lamictal. Appetite then decreased and has been increased lately. Concentration has been "average." Denies SI.  ? ?Past Psychiatric Medication Trials: ?Abilify-ineffective ?Zyprexa-ineffective ?Geodon- effective ?Gabapentin ?Depakote ?Lithium ?Gabapentin ?Hydroxyzine ?Propanolol ?Trazodone ? ?AIMS   ? ?[place] Office Visit from 11/04/2021 in [facility] Office Visit from 05/06/2021 in [facility] Office Visit from 11/04/2020 in [facility] Office Visit from 05/11/2019 in [facility]  ?AIMS Total Score 0 0 0 0  ? ?  ? ?Flowsheet Row ED from 01/09/2022 in [facility] at [place]  ?C-SSRS RISK CATEGORY No Risk  ? ?  ?  ? ?Review of Systems:  ?Review of Systems   ?Musculoskeletal:  Negative for gait problem.  ?Allergic/Immunologic: Positive for environmental allergies.  ?Neurological:  Negative for dizziness, tremors and light-headedness.  ?Psychiatric/Behavioral:    ?     Please refer to HPI  ? ?Medications: I have reviewed the patient's current medications. ? ?Current Outpatient Medications  ?Medication Sig Dispense Refill  ? etonogestrel (NEXPLANON) 68 MG IMPL implant See admin instructions.    ? fluticasone (FLONASE) 50 MCG/ACT nasal spray Place into both nostrils daily.    ? lisinopril (ZESTRIL) 10 MG tablet 1 tablet    ? fexofenadine (ALLEGRA ALLERGY) 180 MG tablet Take 1 tablet (180 mg total) by mouth daily for 15 days. 15 tablet 0  ? gabapentin (NEURONTIN) 300 MG capsule Take 1 capsule (300 mg total) by mouth at bedtime. 90 capsule 1  ? lamoTRIgine (LAMICTAL) 150 MG tablet Take 1 tablet (150 mg total) by mouth daily. 90 tablet 0  ? lithium 300 MG capsule Take 1 capsule (300 mg total) by mouth at bedtime. 90 capsule 1  ? propranolol (INDERAL) 10 MG tablet Take 1/2 tablet twice daily for 5 days, then stop 180 tablet 1  ? ziprasidone (GEODON) 20 MG capsule Take 1 capsule (20 mg total) by mouth every morning. 90 capsule 1  ? ziprasidone (GEODON) 60 MG capsule Take 1 capsule (60 mg total) by mouth at bedtime. 90 capsule 1  ? ?No current facility-administered medications for this visit.  ? ? ?Medication Side Effects: None ? ?Allergies: No Known Allergies ? ?Past Medical History:  ?Diagnosis Date  ? HTN (hypertension)   ? ? ?Past Medical History, Surgical  history, Social history, and Family history were reviewed and updated as appropriate.  ? ?Please see review of systems for further details on the patient's review from today.  ? ?Objective:  ? ?Physical Exam:  ?BP 93/63   Pulse (!) 59  ? ?Physical Exam ?Constitutional:   ?   General: She is not in acute distress. ?Musculoskeletal:     ?   General: No deformity.  ?Neurological:  ?   Mental Status: She is alert and  oriented to person, place, and time.  ?   Coordination: Coordination normal.  ?Psychiatric:     ?   Attention and Perception: Attention and perception normal. She does not perceive auditory or visual hallucinations.     ?   Mood and Affect: Mood normal. Mood is not anxious or depressed. Affect is not labile, blunt, angry or inappropriate.     ?   Speech: Speech normal.     ?   Behavior: Behavior normal.     ?   Thought Content: Thought content normal. Thought content is not paranoid or delusional. Thought content does not include homicidal or suicidal ideation. Thought content does not include homicidal or suicidal plan.     ?   Cognition and Memory: Cognition and memory normal.     ?   Judgment: Judgment normal.  ?   Comments: Insight intact  ? ? ?Lab Review:  ?   ?Component Value Date/Time  ? NA 138 10/28/2021 0946  ? K 4.4 10/28/2021 0946  ? CL 106 10/28/2021 0946  ? CO2 26 10/28/2021 0946  ? GLUCOSE 94 10/28/2021 0946  ? BUN 12 10/28/2021 0946  ? CREATININE 1.09 (H) 10/28/2021 0946  ? CALCIUM 9.8 10/28/2021 0946  ? PROT 6.5 10/30/2016 2144  ? ALBUMIN 3.8 10/30/2016 2144  ? AST 23 10/30/2016 2144  ? ALT 17 10/30/2016 2144  ? ALKPHOS 44 10/30/2016 2144  ? BILITOT 0.4 10/30/2016 2144  ? GFRNONAA >60 10/30/2016 2144  ? GFRAA >60 10/30/2016 2144  ? ? ?   ?Component Value Date/Time  ? WBC 5.2 10/30/2016 2144  ? RBC 4.24 10/30/2016 2144  ? HGB 12.9 10/30/2016 2144  ? HCT 37.1 10/30/2016 2144  ? PLT 179 10/30/2016 2144  ? MCV 87.5 10/30/2016 2144  ? MCH 30.4 10/30/2016 2144  ? MCHC 34.8 10/30/2016 2144  ? RDW 12.9 10/30/2016 2144  ? ? ?Lithium Lvl  ?Date Value Ref Range Status  ?10/28/2021 0.9 0.6 - 1.2 mmol/L Final  ?  ? ?No results found for: PHENYTOIN, PHENOBARB, VALPROATE, CBMZ  ? ?.res ?Assessment: Plan:   ?Pt seen for 30 minutes and time spent discussing treatment plan and will continue to attempt further dose reduction in Lithium to 300 mg po QHS. Will also order re-check of Lithium level and Creatinine level.  Recommended having labs drawn one week after lowering dose to determine blood level at lower dose.  ?Will decrease Propranolol to 10 mg 1/2 tab twice daily for one week, then stop due to low HR and not having any recent anxiety.  ?Continue Geodon 20 mg in the morning and 60 mg at bedtime for mood stabilization.  ?Continue Lamictal 150 mg po qd for mood s/s.  ?Continue Gabapentin 300 mg po QHS for anxiety and insomnia.  ?Pt to follow-up in 2 months or sooner if clinically indicated.  ?Patient advised to contact office with any questions, adverse effects, or acute worsening in signs and symptoms. ? ? ?Jovon was seen today for follow-up. ? ?  Diagnoses and all orders for this visit: ? ?Bipolar I disorder (HCC) ?-     lithium 300 MG capsule; Take 1 capsule (300 mg total) by mouth at bedtime. ?-     lamoTRIgine (LAMICTAL) 150 MG tablet; Take 1 tablet (150 mg total) by mouth daily. ?-     ziprasidone (GEODON) 20 MG capsule; Take 1 capsule (20 mg total) by mouth every morning. ?-     ziprasidone (GEODON) 60 MG capsule; Take 1 capsule (60 mg total) by mouth at bedtime. ? ?High risk medication use ?-     Creatinine, serum ?-     Lithium level ? ?Anxiety state ?-     propranolol (INDERAL) 10 MG tablet; Take 1/2 tablet twice daily for 5 days, then stop ?-     gabapentin (NEURONTIN) 300 MG capsule; Take 1 capsule (300 mg total) by mouth at bedtime. ? ?  ? ?Please see After Visit Summary for patient specific instructions. ? ?Future Appointments  ?Date Time Provider Department Center  ?06/09/2022  8:30 AM Corie Chiquitoarter, Coraline Talwar, PMHNP CP-CP None  ? ? ?Orders Placed This Encounter  ?Procedures  ? Creatinine, serum  ? Lithium level  ? ? ?------------------------------- ?

## 2022-04-08 LAB — LITHIUM LEVEL: Lithium Lvl: <0.3 mmol/L — ABNORMAL LOW (ref 0.6–1.2)

## 2022-04-08 LAB — CREATININE, SERUM: Creat: 1.01 mg/dL — ABNORMAL HIGH (ref 0.50–0.97)

## 2022-04-25 ENCOUNTER — Other Ambulatory Visit: Payer: Self-pay | Admitting: Psychiatry

## 2022-04-25 DIAGNOSIS — F411 Generalized anxiety disorder: Secondary | ICD-10-CM

## 2022-04-25 DIAGNOSIS — F319 Bipolar disorder, unspecified: Secondary | ICD-10-CM

## 2022-05-02 ENCOUNTER — Other Ambulatory Visit: Payer: Self-pay | Admitting: Psychiatry

## 2022-05-02 DIAGNOSIS — F411 Generalized anxiety disorder: Secondary | ICD-10-CM

## 2022-05-02 DIAGNOSIS — F319 Bipolar disorder, unspecified: Secondary | ICD-10-CM

## 2022-05-04 ENCOUNTER — Emergency Department (INDEPENDENT_AMBULATORY_CARE_PROVIDER_SITE_OTHER)
Admission: EM | Admit: 2022-05-04 | Discharge: 2022-05-04 | Disposition: A | Discharge status: Home / Self Care | Payer: Managed Care, Other (non HMO) | Source: Home / Self Care

## 2022-05-04 ENCOUNTER — Encounter: Payer: Self-pay | Admitting: Emergency Medicine

## 2022-05-04 DIAGNOSIS — K219 Gastro-esophageal reflux disease without esophagitis: Secondary | ICD-10-CM | POA: Diagnosis not present

## 2022-05-04 LAB — POCT URINALYSIS DIP (MANUAL ENTRY)
Bilirubin, UA: NEGATIVE
Blood, UA: NEGATIVE
Glucose, UA: NEGATIVE mg/dL
Ketones, POC UA: NEGATIVE mg/dL
Leukocytes, UA: NEGATIVE
Nitrite, UA: NEGATIVE
Protein Ur, POC: NEGATIVE mg/dL
Spec Grav, UA: <=1.005 — AB (ref 1.010–1.025)
Urobilinogen, UA: 0.2 E.U./dL
pH, UA: 6.5 (ref 5.0–8.0)

## 2022-05-04 LAB — POCT URINE PREGNANCY: Preg Test, Ur: NEGATIVE

## 2022-05-04 MED ORDER — OMEPRAZOLE 20 MG PO CPDR: 20.0000 mg | DELAYED_RELEASE_CAPSULE | Freq: Every day | ORAL | 0 refills | Status: DC

## 2022-05-04 NOTE — ED Provider Notes (Signed)
[facility]    CSN: [id number] Arrival date & time: 05/04/22  0949      History   Chief Complaint Chief Complaint  Patient presents with   Emesis    HPI Tracy Russo is a 31 y.o. female.   HPI Patient presents today with concern for possible hemoptysis today. Patient reports for more than year experiencing nausea with brushing of teeth. Today she vomited. She brought a photo and the contents are consistent with the foods she ate yesterday for dinner. She endorses recurrent heart burn and indigestion symptoms. She was seen here for cough and started allergy medications however cough has persisted. She reports cough is sometimes present with heartburn symptoms. No current URI symptoms.  Past Medical History:  Diagnosis Date   HTN (hypertension)     Patient Active Problem List   Diagnosis Date Noted   Anxiety 02/26/2022   Essential hypertension 02/26/2022   Bipolar disorder (HCC) 11/03/2016   Bipolar I disorder, single manic episode, severe, with psychosis (HCC) 12/01/2015    Past Surgical History:  Procedure Laterality Date   THYROID CYST EXCISION      OB History   No obstetric history on file.      Home Medications    Prior to Admission medications   Medication Sig Start Date End Date Taking? Authorizing Provider  omeprazole (PRILOSEC) 20 MG capsule Take 1 capsule (20 mg total) by mouth daily. 05/04/22  Yes Bing NeighborsHarris, Blossom Crume S, FNP  etonogestrel (NEXPLANON) 68 MG IMPL implant See admin instructions.    [provider]  fexofenadine (ALLEGRA ALLERGY) 180 MG tablet Take 1 tablet (180 mg total) by mouth daily for 15 days. 01/09/22 01/24/22  Trevor Ihaagan, Michael, FNP  fluticasone (FLONASE) 50 MCG/ACT nasal spray Place into both nostrils daily.    [provider]  gabapentin (NEURONTIN) 300 MG capsule Take 1 capsule (300 mg total) by mouth at bedtime. 03/31/22 06/29/22  Corie Chiquitoarter, Jessica, PMHNP  lamoTRIgine (LAMICTAL) 150 MG tablet Take 1 tablet (150  mg total) by mouth daily. 03/31/22 06/29/22  Corie Chiquitoarter, Jessica, PMHNP  lisinopril (ZESTRIL) 10 MG tablet 1 tablet 08/05/21   [provider]  lithium 300 MG capsule Take 1 capsule (300 mg total) by mouth at bedtime. 03/31/22 06/29/22  Corie Chiquitoarter, Jessica, PMHNP  propranolol (INDERAL) 10 MG tablet Take 1/2 tablet twice daily for 5 days, then stop Patient not taking: Reported on 05/04/2022 03/31/22   Corie Chiquitoarter, Jessica, PMHNP  ziprasidone (GEODON) 20 MG capsule Take 1 capsule (20 mg total) by mouth every morning. 03/31/22 06/29/22  Corie Chiquitoarter, Jessica, PMHNP  ziprasidone (GEODON) 60 MG capsule Take 1 capsule (60 mg total) by mouth at bedtime. 03/31/22   Corie Chiquitoarter, Jessica, PMHNP    Family History Family History  Problem Relation Age of Onset   Bipolar disorder Mother    Skin cancer Mother    Hypertension Father     Social History Social History   Tobacco Use   Smoking status: Never   Smokeless tobacco: Never  Vaping Use   Vaping Use: Never used  Substance Use Topics   Alcohol use: Yes    Comment: occasional drinker, less than 2 drinks per week   Drug use: Never     Allergies   Patient has no known allergies.   Review of Systems Review of Systems Pertinent negatives listed in HPI   Physical Exam Triage Vital Signs ED Triage Vitals  Enc Vitals Group     BP 05/04/22 1036 116/81  Pulse Rate 05/04/22 1036 72     Resp 05/04/22 1036 15     Temp 05/04/22 1036 99.7 F (37.6 C)     Temp Source 05/04/22 1036 Oral     SpO2 05/04/22 1036 100 %     Weight 05/04/22 1037 200 lb (90.7 kg)     Height 05/04/22 1037 5\' 7"  (1.702 m)     Head Circumference --      Peak Flow --      Pain Score 05/04/22 1036 0     Pain Loc --      Pain Edu? --      Excl. in GC? --    No data found.  Updated Vital Signs BP 116/81 (BP Location: Left Arm)   Pulse 72   Temp 99.7 F (37.6 C) (Oral)   Resp 15   Ht 5\' 7"  (1.702 m)   Wt 200 lb (90.7 kg)   SpO2 100%   BMI 31.32 kg/m   Visual Acuity Right Eye  Distance:   Left Eye Distance:   Bilateral Distance:    Right Eye Near:   Left Eye Near:    Bilateral Near:     Physical Exam Constitutional:      Appearance: Normal appearance.  HENT:     Head: Normocephalic and atraumatic.     Nose: No congestion or rhinorrhea.  Eyes:     Extraocular Movements: Extraocular movements intact.     Pupils: Pupils are equal, round, and reactive to light.  Cardiovascular:     Rate and Rhythm: Normal rate and regular rhythm.  Pulmonary:     Effort: Pulmonary effort is normal.     Breath sounds: Normal breath sounds.  Abdominal:     General: Abdomen is flat. Bowel sounds are normal.  Musculoskeletal:     Cervical back: Normal range of motion.  Lymphadenopathy:     Cervical: No cervical adenopathy.  Skin:    General: Skin is warm and dry.     Capillary Refill: Capillary refill takes less than 2 seconds.  Neurological:     General: No focal deficit present.     Mental Status: She is alert.  Psychiatric:        Mood and Affect: Mood normal.        Behavior: Behavior normal.        Thought Content: Thought content normal.        Judgment: Judgment normal.     UC Treatments / Results  Labs (all labs ordered are listed, but only abnormal results are displayed) Labs Reviewed  POCT URINALYSIS DIP (MANUAL ENTRY) - Abnormal; Notable for the following components:      Result Value   Spec Grav, UA <=1.005 (*)    All other components within normal limits  POCT URINE PREGNANCY    EKG   Radiology No results found.  Procedures Procedures (including critical care time)  Medications Ordered in UC Medications - No data to display  Initial Impression / Assessment and Plan / UC Course  I have reviewed the triage vital signs and the nursing notes.  Pertinent labs & imaging results that were available during my care of the patient were reviewed by me and considered in my medical decision making (see chart for details).    GERD UA  unremarkable Start omeprazole. Limit foods known to be high in acidity. Hydrate well with fluids. Establish with PCP for ongoing management (information given to establish primary care) RTC PRN  Final Clinical  Impressions(s) / UC Diagnoses   Final diagnoses:  Gastroesophageal reflux disease without esophagitis   Discharge Instructions   None    ED Prescriptions     Medication Sig Dispense Auth. Provider   omeprazole (PRILOSEC) 20 MG capsule Take 1 capsule (20 mg total) by mouth daily. 90 capsule Bing Neighbors, FNP      PDMP not reviewed this encounter.   Bing Neighbors, FNP 05/04/22 1213

## 2022-05-04 NOTE — ED Triage Notes (Signed)
Emesis x 1 after brushing teeth this am  Pt states it was red tinged Pt had not eaten prior  Pt gags/coughs when brushing teeth the last year  Has noticed an increase in heart burn the last few years - no meds PCP has left current practice

## 2022-05-21 ENCOUNTER — Ambulatory Visit: Payer: Managed Care, Other (non HMO) | Admitting: Podiatry

## 2022-05-21 ENCOUNTER — Encounter: Payer: Self-pay | Admitting: Podiatry

## 2022-05-21 DIAGNOSIS — M7662 Achilles tendinitis, left leg: Secondary | ICD-10-CM | POA: Diagnosis not present

## 2022-05-21 DIAGNOSIS — B351 Tinea unguium: Secondary | ICD-10-CM | POA: Diagnosis not present

## 2022-05-21 NOTE — Progress Notes (Signed)
  Subjective:  Patient ID: Tracy Russo, female    DOB: 1991/02/09,   MRN: [id number]  Chief Complaint  Patient presents with   Nail Problem     L achilles pain, follow up toe nail    31 y.o. female presents for concern of left achilles pain and follow-up of left great toenail. Relates achilles has been painful for last several weeks. Does relates tight calf muscles. Denies pain today. Relates the toenail is not painful but does snag on things.  Denies any other pedal complaints. Denies n/v/f/c.   Past Medical History:  Diagnosis Date   HTN (hypertension)     Objective:  Physical Exam: Vascular: DP/PT pulses 2/4 bilateral. CFT <3 seconds. Normal hair growth on digits. No edema.  Skin. No lacerations or abrasions bilateral feet. Left hallux nail thickened dystrophic and with debris Musculoskeletal: MMT 5/5 bilateral lower extremities in DF, PF, Inversion and Eversion. Deceased ROM in DF of ankle joint. No tenderness to palpation along achilles today but is very tight in this area and unable to get to neutral.  Neurological: Sensation intact to light touch.   Assessment:  No diagnosis found.   Plan:  Patient was evaluated and treated and all questions answered. -Discussed Achilles insertional tendonitis and treatment options with patient. Discussed dystrophic nails and just filing nail down -Discussed stretching exercises. -Tylenol for pain relief.  -Heel lifts provided and discussed proper shoewear.  -Discussed if no improvement will consider MRI/PT/EPAT/PRP injections.  -Patient to return to office as needed or sooner if condition worsens.   Louann Sjogren, DPM

## 2022-05-21 NOTE — Patient Instructions (Signed)

## 2022-06-09 ENCOUNTER — Encounter: Payer: Self-pay | Admitting: Psychiatry

## 2022-06-09 ENCOUNTER — Ambulatory Visit: Payer: 59 | Admitting: Psychiatry

## 2022-06-09 DIAGNOSIS — F319 Bipolar disorder, unspecified: Secondary | ICD-10-CM | POA: Diagnosis not present

## 2022-06-09 DIAGNOSIS — F411 Generalized anxiety disorder: Secondary | ICD-10-CM

## 2022-06-09 MED ORDER — LAMOTRIGINE 150 MG PO TABS: 150.0000 mg | ORAL_TABLET | Freq: Every day | ORAL | 0 refills | Status: DC

## 2022-06-09 MED ORDER — LITHIUM CARBONATE 150 MG PO CAPS: ORAL_CAPSULE | ORAL | 0 refills | Status: DC

## 2022-06-09 NOTE — Progress Notes (Signed)
Tracy Russo [id number] 1991/07/17 31 y.o.  Subjective:   Patient ID:  Tracy Russo is a 31 y.o. (DOB 01-11-1991) female.  Chief Complaint:  Chief Complaint  Patient presents with   Follow-up    Mood disturbance, anxiety    HPI Tracy Russo presents to the office today for follow-up of Bipolar D/O and anxiety. Denies any negative effects with decrease in Lithium and has not had any manic s/s in the last 2 months. Denies depressed mood. Reports that she sleeps well. She reports that she has lost weight recently and has been more active. Reports eating a little bit less. Energy and motivation have been ok. She has been exercising some in the morning. She has been enjoying gardening. She has been reading some. She reports that her anxiety has been "better." She notices some occ jaw clinching. She reports "some little paranoid thoughts." She reports that these thoughts "go away quickly" and are "less" compared to the past. Concentration has been adequate. Denies SI.    She was promoted and now is working more hours and is full time.    Past Psychiatric Medication Trials: Abilify-ineffective Zyprexa-ineffective Geodon- effective Gabapentin Depakote Lithium Gabapentin Hydroxyzine Propanolol Trazodone   AIMS    Flowsheet Row Office Visit from 06/09/2022 in [facility] Office Visit from 11/04/2021 in [facility] Office Visit from 05/06/2021 in [facility] Office Visit from 11/04/2020 in [facility] Office Visit from 05/11/2019 in [facility]  AIMS Total Score 0 0 0 0 0      Flowsheet Row ED from 05/04/2022 in [facility] ED from 01/09/2022 in [facility] at [facility] RISK CATEGORY No Risk No Risk        Review of Systems:  Review of Systems  Musculoskeletal:  Negative for gait problem.  Neurological:  Negative for tremors.       Occ "black spot  in my vision... it could be heat related."  Psychiatric/Behavioral:         Please refer to HPI    Medications: I have reviewed the patient's current medications.  Current Outpatient Medications  Medication Sig Dispense Refill   etonogestrel (NEXPLANON) 68 MG IMPL implant See admin instructions.     gabapentin (NEURONTIN) 300 MG capsule Take 1 capsule (300 mg total) by mouth at bedtime. 90 capsule 1   lisinopril (ZESTRIL) 10 MG tablet 1 tablet     omeprazole (PRILOSEC) 20 MG capsule Take 1 capsule (20 mg total) by mouth daily. 90 capsule 0   ziprasidone (GEODON) 20 MG capsule Take 1 capsule (20 mg total) by mouth every morning. 90 capsule 1   ziprasidone (GEODON) 60 MG capsule Take 1 capsule (60 mg total) by mouth at bedtime. 90 capsule 1   fexofenadine (ALLEGRA ALLERGY) 180 MG tablet Take 1 tablet (180 mg total) by mouth daily for 15 days. 15 tablet 0   lamoTRIgine (LAMICTAL) 150 MG tablet Take 1 tablet (150 mg total) by mouth daily. 90 tablet 0   lithium carbonate 150 MG capsule Take 1 capsule at bedtime for 2 weeks, then stop. 30 capsule 0   No current facility-administered medications for this visit.    Medication Side Effects: Other: Dry mouth  Allergies: No Known Allergies  Past Medical History:  Diagnosis Date   HTN (hypertension)     Past Medical History, Surgical history, Social history, and Family history were reviewed and updated as appropriate.  Please see review of systems for further details on the patient's review from today.   Objective:   Physical Exam:  There were no vitals taken for this visit.  Physical Exam Constitutional:      General: She is not in acute distress. Musculoskeletal:        General: No deformity.  Neurological:     Mental Status: She is alert and oriented to person, place, and time.     Coordination: Coordination normal.  Psychiatric:        Attention and Perception: Attention and perception normal. She does not perceive auditory  or visual hallucinations.        Mood and Affect: Mood normal. Mood is not anxious or depressed. Affect is not labile, blunt, angry or inappropriate.        Speech: Speech normal.        Behavior: Behavior normal.        Thought Content: Thought content normal. Thought content is not paranoid or delusional. Thought content does not include homicidal or suicidal ideation. Thought content does not include homicidal or suicidal plan.        Cognition and Memory: Cognition and memory normal.        Judgment: Judgment normal.     Comments: Insight intact     Lab Review:     Component Value Date/Time   NA 138 10/28/2021 0946   K 4.4 10/28/2021 0946   CL 106 10/28/2021 0946   CO2 26 10/28/2021 0946   GLUCOSE 94 10/28/2021 0946   BUN 12 10/28/2021 0946   CREATININE 1.01 (H) 04/07/2022 0817   CALCIUM 9.8 10/28/2021 0946   PROT 6.5 10/30/2016 2144   ALBUMIN 3.8 10/30/2016 2144   AST 23 10/30/2016 2144   ALT 17 10/30/2016 2144   ALKPHOS 44 10/30/2016 2144   BILITOT 0.4 10/30/2016 2144   GFRNONAA >60 10/30/2016 2144   GFRAA >60 10/30/2016 2144       Component Value Date/Time   WBC 5.2 10/30/2016 2144   RBC 4.24 10/30/2016 2144   HGB 12.9 10/30/2016 2144   HCT 37.1 10/30/2016 2144   PLT 179 10/30/2016 2144   MCV 87.5 10/30/2016 2144   MCH 30.4 10/30/2016 2144   MCHC 34.8 10/30/2016 2144   RDW 12.9 10/30/2016 2144    Lithium Lvl  Date Value Ref Range Status  04/07/2022 <0.3 (L) 0.6 - 1.2 mmol/L Final     No results found for: "PHENYTOIN", "PHENOBARB", "VALPROATE", "CBMZ"   .res Assessment: Plan:   Patient seen for 30 minutes and time spent discussing response to decrease in lithium.  She denies any worsening mood signs and symptoms with dose reduction in lithium and would like to continue to reduce dosage. Will decrease Lithium to 150 mg po QHS for 2 weeks, then stop. Advised pt to contact office if she experiences worsening mood signs and symptoms. Will continue Lamictal 150  mg daily for mood stabilization. Continue Geodon 20 mg in the morning for mood stabilization.  Continue Geodon 60 mg in the evening for mood stabilization.  Recommend continuing psychotherapy.  Pt to follow-up in 3 months or sooner if clinically indicated.  Patient advised to contact office with any questions, adverse effects, or acute worsening in signs and symptoms.  Rhealyn was seen today for follow-up.  Diagnoses and all orders for this visit:  Bipolar I disorder (HCC) -     lithium carbonate 150 MG capsule; Take 1 capsule at bedtime for 2 weeks, then stop. -  lamoTRIgine (LAMICTAL) 150 MG tablet; Take 1 tablet (150 mg total) by mouth daily.  Anxiety state     Please see After Visit Summary for patient specific instructions.  Future Appointments  Date Time Provider Department Center  09/08/2022  9:30 AM Corie Chiquito, PMHNP [facility] None  09/22/2022  8:30 AM [name], NP [facility] None    No orders of the defined types were placed in this encounter.   -------------------------------

## 2022-07-28 ENCOUNTER — Other Ambulatory Visit: Payer: Self-pay | Admitting: Family Medicine

## 2022-07-28 NOTE — Telephone Encounter (Signed)
Not a pt in this practice. Prescriber not in this practice. Requested Prescriptions  Pending Prescriptions Disp Refills  . omeprazole (PRILOSEC) 20 MG capsule [Pharmacy Med Name: OMEPRAZOLE DR 20 MG CAPSULE] 90 capsule 0    Sig: TAKE 1 CAPSULE BY MOUTH DAILY     Gastroenterology: Proton Pump Inhibitors Failed - 07/28/2022 10:03 AM      Failed - Valid encounter within last 12 months    Recent Outpatient Visits   None     Future Appointments            In 1 month [name], NP [facility]

## 2022-08-13 ENCOUNTER — Ambulatory Visit
Admission: EM | Admit: 2022-08-13 | Discharge: 2022-08-13 | Disposition: A | Discharge status: Home / Self Care | Payer: Managed Care, Other (non HMO) | Attending: Family Medicine | Admitting: Family Medicine

## 2022-08-13 DIAGNOSIS — R197 Diarrhea, unspecified: Secondary | ICD-10-CM | POA: Diagnosis not present

## 2022-08-13 HISTORY — DX: Bipolar disorder, unspecified: F31.9

## 2022-08-13 HISTORY — DX: Gastro-esophageal reflux disease without esophagitis: K21.9

## 2022-08-13 LAB — COMPLETE METABOLIC PANEL WITH GFR
AG Ratio: 2 (calc) (ref 1.0–2.5)
ALT: 14 U/L (ref 6–29)
AST: 16 U/L (ref 10–30)
Albumin: 4.3 g/dL (ref 3.6–5.1)
Alkaline phosphatase (APISO): 60 U/L (ref 31–125)
BUN/Creatinine Ratio: 9 (calc) (ref 6–22)
BUN: 11 mg/dL (ref 7–25)
CO2: 27 mmol/L (ref 20–32)
Calcium: 9.5 mg/dL (ref 8.6–10.2)
Chloride: 104 mmol/L (ref 98–110)
Creat: 1.16 mg/dL — ABNORMAL HIGH (ref 0.50–0.97)
Globulin: 2.2 g/dL (calc) (ref 1.9–3.7)
Glucose, Bld: 86 mg/dL (ref 65–99)
Potassium: 4 mmol/L (ref 3.5–5.3)
Sodium: 136 mmol/L (ref 135–146)
Total Bilirubin: 0.4 mg/dL (ref 0.2–1.2)
Total Protein: 6.5 g/dL (ref 6.1–8.1)
eGFR: 65 mL/min/{1.73_m2} (ref >=60)

## 2022-08-13 LAB — CBC WITH DIFFERENTIAL/PLATELET
Absolute Monocytes: 483 cells/uL (ref 200–950)
Basophils Absolute: 21 cells/uL (ref 0–200)
Basophils Relative: 0.3 %
Eosinophils Absolute: 71 cells/uL (ref 15–500)
Eosinophils Relative: 1 %
HCT: 37.3 % (ref 35.0–45.0)
Hemoglobin: 12.2 g/dL (ref 11.7–15.5)
Lymphs Abs: 1413 cells/uL (ref 850–3900)
MCH: 28.4 pg (ref 27.0–33.0)
MCHC: 32.7 g/dL (ref 32.0–36.0)
MCV: 86.7 fL (ref 80.0–100.0)
MPV: 9.3 fL (ref 7.5–12.5)
Monocytes Relative: 6.8 %
Neutro Abs: 5112 cells/uL (ref 1500–7800)
Neutrophils Relative %: 72 %
Platelets: 277 10*3/uL (ref 140–400)
RBC: 4.3 10*6/uL (ref 3.80–5.10)
RDW: 12.8 % (ref 11.0–15.0)
Total Lymphocyte: 19.9 %
WBC: 7.1 10*3/uL (ref 3.8–10.8)

## 2022-08-13 LAB — LIPASE: Lipase: 35 U/L (ref 7–60)

## 2022-08-13 NOTE — ED Provider Notes (Signed)
Tracy Russo [facility]    CSN: [id number] Arrival date & time: 08/13/22  0914      History   Chief Complaint Chief Complaint  Patient presents with   Diarrhea    HPI Tracy Russo is a 31 y.o. female.   HPI 31 year old female presents with diarrhea intermittent for several (3) weeks.  Reports coming off lithium and omeprazole recently and is questioning if this is related to either medication being discontinued.  Patient reports diarrhea occurs in the morning and will occur with intermittent abdominal cramping.  PMH significant for obesity, HTN, bipolar disorder, and GERD  Past Medical History:  Diagnosis Date   Bipolar disorder (HCC)    GERD (gastroesophageal reflux disease)    HTN (hypertension)     Patient Active Problem List   Diagnosis Date Noted   Anxiety 02/26/2022   Essential hypertension 02/26/2022   Bipolar disorder (HCC) 11/03/2016   Bipolar I disorder, single manic episode, severe, with psychosis (HCC) 12/01/2015    Past Surgical History:  Procedure Laterality Date   THYROID CYST EXCISION      OB History   No obstetric history on file.      Home Medications    Prior to Admission medications   Medication Sig Start Date End Date Taking? Authorizing Provider  etonogestrel (NEXPLANON) 68 MG IMPL implant See admin instructions.    [provider]  fexofenadine (ALLEGRA ALLERGY) 180 MG tablet Take 1 tablet (180 mg total) by mouth daily for 15 days. 01/09/22 01/24/22  Trevor Iha, FNP  gabapentin (NEURONTIN) 300 MG capsule Take 1 capsule (300 mg total) by mouth at bedtime. Patient taking differently: Take 300 mg by mouth at bedtime. Taking as prescribed 03/31/22 06/29/22  Corie Chiquito, PMHNP  lamoTRIgine (LAMICTAL) 150 MG tablet Take 1 tablet (150 mg total) by mouth daily. 06/09/22 09/07/22  Corie Chiquito, PMHNP  lisinopril (ZESTRIL) 10 MG tablet 1 tablet 08/05/21   [provider]  lithium carbonate 150 MG capsule Take 1 capsule at  bedtime for 2 weeks, then stop. 06/09/22   Corie Chiquito, PMHNP  omeprazole (PRILOSEC) 20 MG capsule Take 1 capsule (20 mg total) by mouth daily. 05/04/22   Bing Neighbors, FNP  ziprasidone (GEODON) 20 MG capsule Take 1 capsule (20 mg total) by mouth every morning. 03/31/22 06/29/22  Corie Chiquito, PMHNP  ziprasidone (GEODON) 60 MG capsule Take 1 capsule (60 mg total) by mouth at bedtime. 03/31/22   Corie Chiquito, PMHNP    Family History Family History  Problem Relation Age of Onset   Bipolar disorder Mother    Skin cancer Mother    Hypertension Father     Social History Social History   Tobacco Use   Smoking status: Never   Smokeless tobacco: Never  Vaping Use   Vaping Use: Never used  Substance Use Topics   Alcohol use: Yes    Comment: occasional drinker, less than 2 drinks per week   Drug use: Never     Allergies   Patient has no known allergies.   Review of Systems Review of Systems  Gastrointestinal:  Positive for diarrhea.  All other systems reviewed and are negative.    Physical Exam Triage Vital Signs ED Triage Vitals  Enc Vitals Group     BP 08/13/22 0933 125/85     Pulse Rate 08/13/22 0933 79     Resp 08/13/22 0933 20     Temp 08/13/22 0933 98.4 F (36.9 C)     Temp Source  08/13/22 0933 Oral     SpO2 08/13/22 0933 100 %     Weight 08/13/22 0928 195 lb (88.5 kg)     Height 08/13/22 0928 5\' 8"  (1.727 m)     Head Circumference --      Peak Flow --      Pain Score 08/13/22 0928 3     Pain Loc --      Pain Edu? --      Excl. in GC? --    No data found.  Updated Vital Signs BP 125/85 (BP Location: Right Arm)   Pulse 79   Temp 98.4 F (36.9 C) (Oral)   Resp 20   Ht 5\' 8"  (1.727 m)   Wt 195 lb (88.5 kg)   SpO2 100%   BMI 29.65 kg/m       Physical Exam Vitals and nursing note reviewed.  Constitutional:      Appearance: She is obese. She is ill-appearing.  HENT:     Head: Normocephalic and atraumatic.     Mouth/Throat:     Mouth:  Mucous membranes are moist.     Pharynx: Oropharynx is clear.  Eyes:     Extraocular Movements: Extraocular movements intact.     Conjunctiva/sclera: Conjunctivae normal.     Pupils: Pupils are equal, round, and reactive to light.  Cardiovascular:     Rate and Rhythm: Normal rate and regular rhythm.     Pulses: Normal pulses.     Heart sounds: Normal heart sounds.  Pulmonary:     Effort: Pulmonary effort is normal.     Breath sounds: Normal breath sounds. No wheezing, rhonchi or rales.  Musculoskeletal:     Cervical back: Normal range of motion and neck supple.  Skin:    General: Skin is warm and dry.  Neurological:     General: No focal deficit present.     Mental Status: She is alert and oriented to person, place, and time.      UC Treatments / Results  Labs (all labs ordered are listed, but only abnormal results are displayed) Labs Reviewed  CBC WITH DIFFERENTIAL/PLATELET  COMPLETE METABOLIC PANEL WITH GFR  LIPASE    EKG   Radiology No results found.  Procedures Procedures (including critical care time)  Medications Ordered in UC Medications - No data to display  Initial Impression / Assessment and Plan / UC Course  I have reviewed the triage vital signs and the nursing notes.  Pertinent labs & imaging results that were available during my care of the patient were reviewed by me and considered in my medical decision making (see chart for details).     MDM: 1.  Diarrhea, unspecified type-CBC with differential, CMP, lipase ordered.  Discussed possibility of tapering lithium to completely discontinued causing diarrhea.  Advised patient if symptoms worsen and/or unresolved please follow-up with PCP, GI, or here for further evaluation.  Advised patient we will follow-up with lab results once received. Final Clinical Impressions(s) / UC Diagnoses   Final diagnoses:  Diarrhea, unspecified type     Discharge Instructions      Advised patient if symptoms  worsen and/or unresolved please follow-up with PCP, GI, or here for further evaluation.  Advised patient we will follow-up with lab results once received.     ED Prescriptions   None    PDMP not reviewed this encounter.   08/15/22, FNP 08/13/22 1025

## 2022-08-13 NOTE — ED Triage Notes (Signed)
Pt presents to Urgent Care with c/o intermittent diarrhea x several weeks. Reports coming off of Lithium and omeprazole recently and is questioning if this is related to that. Reports diarrhea occurs in the morning and she has abdominal cramping today.

## 2022-08-13 NOTE — Discharge Instructions (Addendum)
Advised patient if symptoms worsen and/or unresolved please follow-up with PCP, GI, or here for further evaluation.  Advised patient we will follow-up with lab results once received.

## 2022-08-13 NOTE — ED Notes (Signed)
Stat lab pickup order placed w/ [facility]. Confirmation number [id number]

## 2022-09-08 ENCOUNTER — Encounter: Payer: Self-pay | Admitting: Psychiatry

## 2022-09-08 ENCOUNTER — Ambulatory Visit: Payer: 59 | Admitting: Psychiatry

## 2022-09-08 DIAGNOSIS — F411 Generalized anxiety disorder: Secondary | ICD-10-CM

## 2022-09-08 DIAGNOSIS — F319 Bipolar disorder, unspecified: Secondary | ICD-10-CM | POA: Diagnosis not present

## 2022-09-08 MED ORDER — LAMOTRIGINE 150 MG PO TABS: 150.0000 mg | ORAL_TABLET | Freq: Every day | ORAL | 1 refills | Status: DC

## 2022-09-08 MED ORDER — ZIPRASIDONE HCL 60 MG PO CAPS: 60.0000 mg | ORAL_CAPSULE | Freq: Every day | ORAL | 1 refills | Status: DC

## 2022-09-08 MED ORDER — ZIPRASIDONE HCL 20 MG PO CAPS: 20.0000 mg | ORAL_CAPSULE | Freq: Every morning | ORAL | 1 refills | Status: DC

## 2022-09-08 MED ORDER — GABAPENTIN 300 MG PO CAPS: 300.0000 mg | ORAL_CAPSULE | Freq: Every day | ORAL | 1 refills | Status: DC

## 2022-09-08 NOTE — Progress Notes (Signed)
Tracy Russo [id number] 03-Jul-1991 31 y.o.  Subjective:   Patient ID:  Tracy Russo is a 31 y.o. (DOB 01/21/1991) female.  Chief Complaint:  Chief Complaint  Patient presents with   Follow-up    Bipolar Disorder and anxiety    HPI Tracy Russo presents to the office today for follow-up of Bipolar Disorder and anxiety. She reports that she has been "doing well" and has been off Lithium since July. She reports that her mood has been stable. Denies depressed mood.  She reports sometimes feeling "emotional... teary eyed" about 3 times since last visit. Denies elevated moods. She reports that she had 2 mornings, not consecutively, where she woke at 5 am and felt like doing more around the house. Denies impulsive or risky behavior. Denies irritability. She reports a few nights where she has had difficulty falling asleep. Energy and motivation have been ok overall. Concentration has been good. Appetite has been good. She reports losing weight since stopping Lithium. Has been moderating food intake to help with heartburn. Denies SI.   Has been full time since July. Had some work related stress.   Giving a presentation next week.   Father-in-law attempted suicide.   Continues to see therapist every 1-2 months.   Past Psychiatric Medication Trials: Abilify-ineffective Zyprexa-ineffective Geodon- effective Gabapentin Depakote Lithium Gabapentin Hydroxyzine Propanolol Trazodone    AIMS    Flowsheet Row Office Visit from 09/08/2022 in [facility] Office Visit from 06/09/2022 in [facility] Office Visit from 11/04/2021 in [facility] Office Visit from 05/06/2021 in [facility] Office Visit from 11/04/2020 in [facility]  AIMS Total Score 0 0 0 0 0      Flowsheet Row ED from 08/13/2022 in [facility] ED from 05/04/2022 in [facility] ED from  01/09/2022 in [facility] RISK CATEGORY Error: Question 6 not populated No Risk No Risk        Review of Systems:  Review of Systems  Gastrointestinal:  Negative for diarrhea.       Reports heartburn has been manageable.   Musculoskeletal:  Negative for gait problem.  Neurological:  Negative for tremors.  Psychiatric/Behavioral:         Please refer to HPI    Medications: I have reviewed the patient's current medications.  Current Outpatient Medications  Medication Sig Dispense Refill   etonogestrel (NEXPLANON) 68 MG IMPL implant See admin instructions.     lisinopril (ZESTRIL) 10 MG tablet 1 tablet     fexofenadine (ALLEGRA ALLERGY) 180 MG tablet Take 1 tablet (180 mg total) by mouth daily for 15 days. 15 tablet 0   gabapentin (NEURONTIN) 300 MG capsule Take 1 capsule (300 mg total) by mouth at bedtime. 90 capsule 1   lamoTRIgine (LAMICTAL) 150 MG tablet Take 1 tablet (150 mg total) by mouth daily. 90 tablet 1   ziprasidone (GEODON) 20 MG capsule Take 1 capsule (20 mg total) by mouth every morning. 90 capsule 1   ziprasidone (GEODON) 60 MG capsule Take 1 capsule (60 mg total) by mouth at bedtime. 90 capsule 1   No current facility-administered medications for this visit.    Medication Side Effects: Other: Possible increased hunger  Allergies: No Known Allergies  Past Medical History:  Diagnosis Date   Bipolar disorder (HCC)    GERD (gastroesophageal reflux disease)    HTN (hypertension)     Past Medical  History, Surgical history, Social history, and Family history were reviewed and updated as appropriate.   Please see review of systems for further details on the patient's review from today.   Objective:   Physical Exam:  Wt 187 lb (84.8 kg)   BMI 28.43 kg/m   Physical Exam  Lab Review:     Component Value Date/Time   NA 136 08/13/2022 1006   K 4.0 08/13/2022 1006   CL 104 08/13/2022 1006   CO2 27 08/13/2022 1006    GLUCOSE 86 08/13/2022 1006   BUN 11 08/13/2022 1006   CREATININE 1.16 (H) 08/13/2022 1006   CALCIUM 9.5 08/13/2022 1006   PROT 6.5 08/13/2022 1006   ALBUMIN 3.8 10/30/2016 2144   AST 16 08/13/2022 1006   ALT 14 08/13/2022 1006   ALKPHOS 44 10/30/2016 2144   BILITOT 0.4 08/13/2022 1006   GFRNONAA >60 10/30/2016 2144   GFRAA >60 10/30/2016 2144       Component Value Date/Time   WBC 7.1 08/13/2022 1006   RBC 4.30 08/13/2022 1006   HGB 12.2 08/13/2022 1006   HCT 37.3 08/13/2022 1006   PLT 277 08/13/2022 1006   MCV 86.7 08/13/2022 1006   MCH 28.4 08/13/2022 1006   MCHC 32.7 08/13/2022 1006   RDW 12.8 08/13/2022 1006   LYMPHSABS 1,413 08/13/2022 1006   EOSABS 71 08/13/2022 1006   BASOSABS 21 08/13/2022 1006    Lithium Lvl  Date Value Ref Range Status  04/07/2022 <0.3 (L) 0.6 - 1.2 mmol/L Final     No results found for: "PHENYTOIN", "PHENOBARB", "VALPROATE", "CBMZ"   .res Assessment: Plan:    Pt seen for 30 minutes and time spent discussing response to discontinuation of Lithium. She reports that her mood remains stable overall and would like to continue current medications without changes at this time.  Continue Lamictal 150 mg po qd for mood stabilization.  Continue Geodon 20 mg in the morning and 60 mg at bedtime for mood stabilization.  Continue Gabapentin 300 mg po QHS for insomnia and anxiety. Recommend continuing psychotherapy.  Pt to follow-up in 6 months or sooner if clinically indicated.  Patient advised to contact office with any questions, adverse effects, or acute worsening in signs and symptoms.   Tracy Russo was seen today for follow-up.  Diagnoses and all orders for this visit:  Anxiety state -     gabapentin (NEURONTIN) 300 MG capsule; Take 1 capsule (300 mg total) by mouth at bedtime.  Bipolar I disorder (HCC) -     lamoTRIgine (LAMICTAL) 150 MG tablet; Take 1 tablet (150 mg total) by mouth daily. -     ziprasidone (GEODON) 20 MG capsule; Take 1 capsule  (20 mg total) by mouth every morning. -     ziprasidone (GEODON) 60 MG capsule; Take 1 capsule (60 mg total) by mouth at bedtime.     Please see After Visit Summary for patient specific instructions.  Future Appointments  Date Time Provider [place]  09/22/2022  8:30 AM Samuel Bouche, NP [facility] None  03/09/2023  9:30 AM Thayer Headings, PMHNP CP-CP None    No orders of the defined types were placed in this encounter.   -------------------------------

## 2022-09-21 DIAGNOSIS — K219 Gastro-esophageal reflux disease without esophagitis: Secondary | ICD-10-CM | POA: Insufficient documentation

## 2022-09-21 NOTE — Progress Notes (Signed)
New Patient Office Visit  Subjective:  Patient ID: Tracy Russo, female    DOB: 06/10/1991  Age: 31 y.o. MRN: [id number]  CC:  Chief Complaint  Patient presents with   Establish Care   HPI Tracy Russo presents to establish care.  She is a pleasant 31 year old female who is currently followed by psychiatry.  She has bipolar disorder as well as anxiety which is well managed on her current medications.  Hypertension: Reports that her blood pressure was elevated at her OB/GYN appointment when she went to have her IUD checked.  At her next appointment, her blood pressure was again elevated and it was recommended that she start a medication.  She is currently taking lisinopril 10 mg daily, tolerating well without side effects.  Does not regularly check her blood pressure at home.  Still adds salt to her foods on a regular basis.  Exercises regularly, walking and Pilates.   GERD: Was having difficulty with vomiting in the mornings when she was brushing her teeth.  Went to urgent care and was diagnosed with GERD.  She was placed on omeprazole for short course which was somewhat helpful however when this ran out, her reflux symptoms came back.  She does have an intermittent cough, worse at night and in the morning.  Not currently taking any medications.  Notes that she does like to cook and often eats foods that are on the list of foods to avoid.  Recently, she notes that when she lies on her back at night, her symptoms are not bad however when she turns on her side, her stomach begins to hurt.  Having regular bowel movements, some occasional diarrhea but no constipation.  Denies fever, chills, melena, hematochezia, hematemesis, and severe abdominal pain.  Past Medical History:  Diagnosis Date   Bipolar disorder (HCC)    GERD (gastroesophageal reflux disease)    HTN (hypertension)     Past Surgical History:  Procedure Laterality Date   THYROID CYST EXCISION     Family History  Problem  Relation Age of Onset   Bipolar disorder Mother    Skin cancer Mother    Hypertension Father    Social History   Socioeconomic History   Marital status: Married    Spouse name: Not on file   Number of children: Not on file   Years of education: Not on file   Highest education level: Not on file  Occupational History   Not on file  Tobacco Use   Smoking status: Never   Smokeless tobacco: Never  Vaping Use   Vaping Use: Never used  Substance and Sexual Activity   Alcohol use: Yes    Comment: occasional drinker, less than 2 drinks per week   Drug use: Never   Sexual activity: Yes    Birth control/protection: Implant  Other Topics Concern   Not on file  Social History Narrative   Not on file   Social Determinants of Health   Financial Resource Strain: Not on file  Food Insecurity: Not on file  Transportation Needs: Not on file  Physical Activity: Not on file  Stress: Not on file  Social Connections: Not on file  Intimate Partner Violence: Not on file    ROS Review of Systems  Constitutional:  Negative for chills, fatigue, fever and unexpected weight change.  HENT:  Negative for congestion, rhinorrhea, sinus pressure and sore throat.   Respiratory:  Negative for cough, chest tightness and shortness of breath.  Cardiovascular:  Negative for chest pain, palpitations and leg swelling.  Gastrointestinal:  Negative for abdominal pain, constipation, diarrhea, nausea and vomiting.  Endocrine: Negative for cold intolerance, heat intolerance, polydipsia, polyphagia and polyuria.  Genitourinary:  Negative for dysuria, frequency, urgency, vaginal bleeding and vaginal discharge.  Skin:  Negative for rash and wound.  Allergic/Immunologic: Positive for environmental allergies. Negative for food allergies.  Neurological:  Negative for dizziness, light-headedness and headaches.  Hematological:  Bruises/bleeds easily.  Psychiatric/Behavioral:  Negative for dysphoric mood,  self-injury, sleep disturbance and suicidal ideas. The patient is not nervous/anxious.    Objective:   Today's Vitals: BP 106/69   Pulse 82   Ht 5\' 8"  (1.727 m)   Wt 186 lb (84.4 kg)   SpO2 100%   BMI 28.28 kg/m   Physical Exam Vitals reviewed.  Constitutional:      General: She is not in acute distress.    Appearance: Normal appearance. She is obese. She is not ill-appearing.  HENT:     Head: Normocephalic and atraumatic.     Right Ear: Tympanic membrane normal.     Left Ear: Tympanic membrane normal.     Nose: Nose normal.     Mouth/Throat:     Mouth: Mucous membranes are moist.     Pharynx: No oropharyngeal exudate or posterior oropharyngeal erythema.  Eyes:     Extraocular Movements: Extraocular movements intact.     Conjunctiva/sclera: Conjunctivae normal.     Pupils: Pupils are equal, round, and reactive to light.  Neck:     Thyroid: No thyromegaly.     Vascular: No carotid bruit or JVD.     Trachea: Trachea normal.  Cardiovascular:     Rate and Rhythm: Normal rate and regular rhythm.     Pulses: Normal pulses.     Heart sounds: Normal heart sounds. No murmur heard.    No friction rub. No gallop.  Pulmonary:     Effort: Pulmonary effort is normal. No respiratory distress.     Breath sounds: Normal breath sounds. No wheezing.  Abdominal:     General: Bowel sounds are normal. There is no distension.     Palpations: Abdomen is soft.     Tenderness: There is no abdominal tenderness. There is no guarding.  Musculoskeletal:        General: Normal range of motion.     Cervical back: Normal range of motion and neck supple.  Skin:    General: Skin is warm and dry.  Neurological:     Mental Status: She is alert and oriented to person, place, and time.     Cranial Nerves: No cranial nerve deficit.  Psychiatric:        Mood and Affect: Mood normal.        Behavior: Behavior normal.        Thought Content: Thought content normal.        Judgment: Judgment normal.     Assessment & Plan:   1. Encounter to establish care Reviewed available information and discussed care concerns with patient.   2. Gastroesophageal reflux disease, unspecified whether esophagitis present Start famotidine 20 mg twice daily as needed.  Recommend taking this scheduled twice daily for the first week and then reducing to as needed use, especially at night.  Discussed trying an elimination diet to identify specific food triggers to avoid.  3. Essential hypertension Blood pressure at goal.  Continue lisinopril 10 mg daily.  4. Bipolar disorder in full remission, most recent episode  unspecified type ([place]) 5. Anxiety Managed by psychiatry.   Outpatient Encounter Medications as of 09/22/2022  Medication Sig   etonogestrel (NEXPLANON) 26 MG IMPL implant See admin instructions.   famotidine (PEPCID) 20 MG tablet Take 1 tablet (20 mg total) by mouth 2 (two) times daily.   gabapentin (NEURONTIN) 300 MG capsule Take 1 capsule (300 mg total) by mouth at bedtime.   lamoTRIgine (LAMICTAL) 150 MG tablet Take 1 tablet (150 mg total) by mouth daily.   ziprasidone (GEODON) 20 MG capsule Take 1 capsule (20 mg total) by mouth every morning.   ziprasidone (GEODON) 60 MG capsule Take 1 capsule (60 mg total) by mouth at bedtime.   [DISCONTINUED] lisinopril (ZESTRIL) 10 MG tablet 1 tablet   fexofenadine (ALLEGRA ALLERGY) 180 MG tablet Take 1 tablet (180 mg total) by mouth daily.   lisinopril (ZESTRIL) 10 MG tablet Take 1 tablet (10 mg total) by mouth daily.   [DISCONTINUED] fexofenadine (ALLEGRA ALLERGY) 180 MG tablet Take 1 tablet (180 mg total) by mouth daily for 15 days.   No facility-administered encounter medications on file as of 09/22/2022.   Follow-up: Return in about 6 weeks (around 11/03/2022) for GERD follow up.   Clearnce Sorrel, DNP, APRN, FNP-BC [facility]

## 2022-09-22 ENCOUNTER — Ambulatory Visit: Payer: Managed Care, Other (non HMO) | Admitting: Medical-Surgical

## 2022-09-22 ENCOUNTER — Encounter: Payer: Self-pay | Admitting: Medical-Surgical

## 2022-09-22 VITALS — BP 106/69 | HR 82 | Ht 68.0 in | Wt 186.0 lb

## 2022-09-22 DIAGNOSIS — F3170 Bipolar disorder, currently in remission, most recent episode unspecified: Secondary | ICD-10-CM

## 2022-09-22 DIAGNOSIS — I10 Essential (primary) hypertension: Secondary | ICD-10-CM | POA: Diagnosis not present

## 2022-09-22 DIAGNOSIS — Z7689 Persons encountering health services in other specified circumstances: Secondary | ICD-10-CM | POA: Diagnosis not present

## 2022-09-22 DIAGNOSIS — F419 Anxiety disorder, unspecified: Secondary | ICD-10-CM

## 2022-09-22 DIAGNOSIS — K219 Gastro-esophageal reflux disease without esophagitis: Secondary | ICD-10-CM

## 2022-09-22 MED ORDER — FAMOTIDINE 20 MG PO TABS: 20.0000 mg | ORAL_TABLET | Freq: Two times a day (BID) | ORAL | 1 refills | Status: DC

## 2022-09-22 MED ORDER — FEXOFENADINE HCL 180 MG PO TABS: 180.0000 mg | ORAL_TABLET | Freq: Every day | ORAL | 1 refills | Status: DC

## 2022-09-22 MED ORDER — LISINOPRIL 10 MG PO TABS: 10.0000 mg | ORAL_TABLET | Freq: Every day | ORAL | 1 refills | Status: DC

## 2022-10-14 ENCOUNTER — Encounter: Payer: Self-pay | Admitting: Medical-Surgical

## 2022-11-08 NOTE — Progress Notes (Unsigned)
   Established Patient Office Visit  Subjective   Patient ID: Tracy Russo, female   DOB: 03-18-1991 Age: 31 y.o. MRN: [id number]   No chief complaint on file.   HPI Pleasant 31 year old female presenting today for follow up on GERD. At her last visit, we discussed her persistent reflux symptoms. She was prescribed famotidine 20mg  BID prn with instructions to take the medication scheduled BID for a week then use it prn thereafter. An elimination diet was also recommended to help identify food triggers to avoid.    Objective:    There were no vitals filed for this visit.  Physical Exam   No results found for this or any previous visit (from the past 24 hour(s)).   {Labs (Optional):23779}  The ASCVD Risk score (Arnett DK, et al., 2019) failed to calculate for the following reasons:   The 2019 ASCVD risk score is only valid for ages 7 to 26   Assessment & Plan:   No problem-specific Assessment & Plan notes found for this encounter.   No follow-ups on file.  ___________________________________________ 76, DNP, APRN, FNP-BC [facility] [place]

## 2022-11-09 ENCOUNTER — Encounter: Payer: Self-pay | Admitting: Medical-Surgical

## 2022-11-09 ENCOUNTER — Ambulatory Visit: Payer: Managed Care, Other (non HMO) | Admitting: Medical-Surgical

## 2022-11-09 VITALS — BP 101/69 | HR 83 | Resp 20 | Ht 68.0 in | Wt 188.6 lb

## 2022-11-09 DIAGNOSIS — K219 Gastro-esophageal reflux disease without esophagitis: Secondary | ICD-10-CM | POA: Diagnosis not present

## 2022-11-09 DIAGNOSIS — Z23 Encounter for immunization: Secondary | ICD-10-CM

## 2022-11-09 DIAGNOSIS — I10 Essential (primary) hypertension: Secondary | ICD-10-CM

## 2022-11-09 MED ORDER — FAMOTIDINE 20 MG PO TABS: 20.0000 mg | ORAL_TABLET | Freq: Two times a day (BID) | ORAL | 3 refills | Status: DC

## 2023-02-04 ENCOUNTER — Encounter: Payer: Self-pay | Admitting: Medical-Surgical

## 2023-02-04 ENCOUNTER — Ambulatory Visit: Payer: Managed Care, Other (non HMO) | Admitting: Medical-Surgical

## 2023-02-04 VITALS — BP 98/66 | HR 69 | Resp 20 | Ht 68.0 in | Wt 186.7 lb

## 2023-02-04 DIAGNOSIS — B351 Tinea unguium: Secondary | ICD-10-CM | POA: Diagnosis not present

## 2023-02-04 DIAGNOSIS — J302 Other seasonal allergic rhinitis: Secondary | ICD-10-CM

## 2023-02-04 DIAGNOSIS — I10 Essential (primary) hypertension: Secondary | ICD-10-CM | POA: Diagnosis not present

## 2023-02-04 MED ORDER — FEXOFENADINE HCL 180 MG PO TABS: 180.0000 mg | ORAL_TABLET | Freq: Every day | ORAL | 1 refills | Status: DC

## 2023-02-04 MED ORDER — LISINOPRIL 10 MG PO TABS: 10.0000 mg | ORAL_TABLET | Freq: Every day | ORAL | 1 refills | Status: DC

## 2023-02-04 MED ORDER — CICLOPIROX 8 % EX SOLN: Freq: Every day | CUTANEOUS | 5 refills | Status: DC

## 2023-02-04 NOTE — Progress Notes (Signed)
   Established Patient Office Visit  Subjective   Patient ID: Journe Fagley, female   DOB: 07/12/1991 Age: 32 y.o. MRN: [id number]   Chief Complaint  Patient presents with   Nail Problem    RIGHT BIG TOE    HPI Pleasant 32 year old female presenting today for the following:  Over the last couple months has had a yellow/white discoloration on the right great toenail at the medial border.  She has not been getting pedicures.  Notes that the area has been stable and does not seem to be getting bigger.  She has not tried any treatment at home.  Denies any other toenails that are affected.  The toenail is not thickened and the corner is not lifted.  Requesting refills on lisinopril and Allegra.   Objective:    Vitals:   02/04/23 1346  BP: 98/66  Pulse: 69  Resp: 20  Height: '5\' 8"'$  (1.727 m)  Weight: 186 lb 11.2 oz (84.7 kg)  SpO2: 100%  BMI (Calculated): 28.39    Physical Exam Vitals reviewed.  Constitutional:      General: She is not in acute distress.    Appearance: Normal appearance. She is not ill-appearing.  HENT:     Head: Normocephalic and atraumatic.  Cardiovascular:     Rate and Rhythm: Normal rate and regular rhythm.     Pulses: Normal pulses.     Heart sounds: Normal heart sounds.  Pulmonary:     Effort: Pulmonary effort is normal. No respiratory distress.     Breath sounds: Normal breath sounds. No wheezing, rhonchi or rales.  Feet:     Right foot:     Toenail Condition: Fungal disease present.    Comments: Right great toenail Skin:    General: Skin is warm and dry.  Neurological:     Mental Status: She is alert and oriented to person, place, and time.  Psychiatric:        Mood and Affect: Mood normal.        Behavior: Behavior normal.        Thought Content: Thought content normal.        Judgment: Judgment normal.   No results found for this or any previous visit (from the past 24 hour(s)).     The ASCVD Risk score (Arnett DK, et al., 2019)  failed to calculate for the following reasons:   The 2019 ASCVD risk score is only valid for ages 29 to 53   Assessment & Plan:   1. Essential hypertension Blood pressure stable in the little on the lower side of the normal range.  Advised that she should monitor this at home and if it becomes too low or she becomes symptomatic, we may need to cut back on her blood pressure medication.  Refilling lisinopril 10 mg daily.  2. Onychomycosis of right great toe Start Penlac nightly x 7 days, remove with alcohol and restart cycle.  Advised that this may take weeks to months to clear and she should monitor for progress.  If making no progress, we may need to identify oral options.  3. Seasonal allergies Refilling Allegra.  Return if symptoms worsen or fail to improve.  ___________________________________________ Clearnce Sorrel, DNP, APRN, FNP-BC [facility] [place]

## 2023-03-09 ENCOUNTER — Ambulatory Visit: Payer: 59 | Admitting: Psychiatry

## 2023-03-09 ENCOUNTER — Encounter: Payer: Self-pay | Admitting: Psychiatry

## 2023-03-09 DIAGNOSIS — F319 Bipolar disorder, unspecified: Secondary | ICD-10-CM | POA: Diagnosis not present

## 2023-03-09 DIAGNOSIS — F411 Generalized anxiety disorder: Secondary | ICD-10-CM

## 2023-03-09 MED ORDER — ZIPRASIDONE HCL 20 MG PO CAPS: 20.0000 mg | ORAL_CAPSULE | Freq: Every morning | ORAL | 1 refills | Status: DC

## 2023-03-09 MED ORDER — LAMOTRIGINE 150 MG PO TABS: 150.0000 mg | ORAL_TABLET | Freq: Every day | ORAL | 1 refills | Status: DC

## 2023-03-09 MED ORDER — GABAPENTIN 300 MG PO CAPS: ORAL_CAPSULE | ORAL | 1 refills | Status: DC

## 2023-03-09 MED ORDER — ZIPRASIDONE HCL 60 MG PO CAPS: 60.0000 mg | ORAL_CAPSULE | Freq: Every day | ORAL | 1 refills | Status: DC

## 2023-03-09 NOTE — Progress Notes (Signed)
Tracy Russo [id number] 02-27-1991 32 y.o.  Subjective:   Patient ID:  Tracy Russo is a 32 y.o. (DOB 01-Jan-1991) female.  Chief Complaint:  Chief Complaint  Patient presents with   Insomnia   Follow-up    Bipolar Disorder, anxiety    HPI Tracy Russo presents to the office today for follow-up of Bipolar Disorder and anxiety.   She reports difficulty with sleep initiation. She reports that it can take up to 2 hours to fall asleep. She reports that she used to wake up and read in the living room to go back to sleep, however her husband has been in the living room.She has been having some unusually dreams. Estimates sleeping about 8 hours a night with some interruptions to go to the bathroom. She reports that she noticed she slept well after increased activity. She has about 5 cups of coffee recently. She reports that she has been drinking significant amounts of caffeine for awhile. She reports that her work has been stressful. She is thinking about work or the past when trying to fall asleep. She reports that she has some anxious thoughts. Denies racing thoughts- "a lot of them."  Denies panic or anxiety during the day. Denies any manic s/s. She reports that her energy fluctuates, usually in response to activity. Denies excessive energy. Denies increased goal-directed activity. She reports some mild depression that may be work-related. Energy is lower in the mornings and "once I kind of wake up, I am good for the day." She reports that her motivation varies. Motivation is better when she has a deadline. Concentration is good. Appetite has been good. Denies SI.   She is planning things for work for the summer. 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 Reports different changes at work and may need to move her office.   No longer seeing therapist.  Was previously seeing Malachi Carl.  Past Psychiatric Medication Trials: Abilify-ineffective Zyprexa-ineffective Geodon-  effective Gabapentin Depakote Lithium Gabapentin Hydroxyzine Propanolol Trazodone   AIMS    Flowsheet Row Office Visit from 03/09/2023 in [facility] Office Visit from 09/08/2022 in [facility] Office Visit from 06/09/2022 in [facility] Office Visit from 11/04/2021 in [facility] Office Visit from 05/06/2021 in [facility]  AIMS Total Score 0 0 0 0 0      GAD-7    Flowsheet Row Office Visit from 09/22/2022 in [facility] at [facility]  Total GAD-7 Score 2      PHQ2-9    Flowsheet Row Office Visit from 02/04/2023 in [facility] at [facility] Office Visit from 09/22/2022 in [facility] at [facility]  PHQ-2 Total Score 0 0  PHQ-9 Total Score -- 0      Flowsheet Row ED from 08/13/2022 in [facility] ED from 05/04/2022 in [facility] ED from 01/09/2022 in [facility] RISK CATEGORY Error: Question 6 not populated No Risk No Risk        Review of Systems:  Review of Systems  Musculoskeletal:  Negative for gait problem.  Neurological:  Negative for tremors.  Psychiatric/Behavioral:         Please refer to HPI    Medications: I have reviewed the patient's current medications.  Current Outpatient  Medications  Medication Sig Dispense Refill   ciclopirox (PENLAC) 8 % solution Apply topically at bedtime. Apply over nail and surrounding skin. Apply daily over previous coat. After seven (7) days, may remove with alcohol and continue cycle. 6 mL 5   etonogestrel (NEXPLANON) 68 MG IMPL implant See admin instructions.     famotidine (PEPCID) 20 MG tablet Take 1 tablet (20 mg total) by mouth 2 (two) times daily. 180 tablet 3    fexofenadine (ALLEGRA ALLERGY) 180 MG tablet Take 1 tablet (180 mg total) by mouth daily. 90 tablet 1   lisinopril (ZESTRIL) 10 MG tablet Take 1 tablet (10 mg total) by mouth daily. 90 tablet 1   gabapentin (NEURONTIN) 300 MG capsule Take 1-2 capsules at bedtime 180 capsule 1   lamoTRIgine (LAMICTAL) 150 MG tablet Take 1 tablet (150 mg total) by mouth daily. 90 tablet 1   ziprasidone (GEODON) 20 MG capsule Take 1 capsule (20 mg total) by mouth every morning. 90 capsule 1   ziprasidone (GEODON) 60 MG capsule Take 1 capsule (60 mg total) by mouth at bedtime. 90 capsule 1   No current facility-administered medications for this visit.    Medication Side Effects: None  Allergies: No Known Allergies  Past Medical History:  Diagnosis Date   Bipolar disorder    GERD (gastroesophageal reflux disease)    HTN (hypertension)     Past Medical History, Surgical history, Social history, and Family history were reviewed and updated as appropriate.   Please see review of systems for further details on the patient's review from today.   Objective:   Physical Exam:  BP 105/74   Pulse 74   Physical Exam Constitutional:      General: She is not in acute distress. Musculoskeletal:        General: No deformity.  Neurological:     Mental Status: She is alert and oriented to person, place, and time.     Coordination: Coordination normal.  Psychiatric:        Attention and Perception: Attention and perception normal. She does not perceive auditory or visual hallucinations.        Mood and Affect: Affect is not labile, blunt, angry or inappropriate.        Speech: Speech normal.        Behavior: Behavior normal.        Thought Content: Thought content normal. Thought content is not paranoid or delusional. Thought content does not include homicidal or suicidal ideation. Thought content does not include homicidal or suicidal plan.        Cognition and Memory: Cognition and memory normal.         Judgment: Judgment normal.     Comments: Insight intact Mood is mildly sad and anxious in response to work stress     Lab Review:     Component Value Date/Time   NA 136 08/13/2022 1006   NA 139 02/02/2022 0000   K 4.0 08/13/2022 1006   CL 104 08/13/2022 1006   CO2 27 08/13/2022 1006   GLUCOSE 86 08/13/2022 1006   BUN 11 08/13/2022 1006   BUN 12 02/02/2022 0000   CREATININE 1.16 (H) 08/13/2022 1006   CALCIUM 9.5 08/13/2022 1006   PROT 6.5 08/13/2022 1006   ALBUMIN 4.3 02/02/2022 0000   AST 16 08/13/2022 1006   ALT 14 08/13/2022 1006   ALKPHOS 72 02/02/2022 0000   BILITOT 0.4 08/13/2022 1006   GFRNONAA >60 10/30/2016 2144   GFRAA >60  10/30/2016 2144       Component Value Date/Time   WBC 7.1 08/13/2022 1006   RBC 4.30 08/13/2022 1006   HGB 12.2 08/13/2022 1006   HCT 37.3 08/13/2022 1006   PLT 277 08/13/2022 1006   MCV 86.7 08/13/2022 1006   MCH 28.4 08/13/2022 1006   MCHC 32.7 08/13/2022 1006   RDW 12.8 08/13/2022 1006   LYMPHSABS 1,413 08/13/2022 1006   EOSABS 71 08/13/2022 1006   BASOSABS 21 08/13/2022 1006    Lithium Lvl  Date Value Ref Range Status  04/07/2022 <0.3 (L) 0.6 - 1.2 mmol/L Final     No results found for: "PHENYTOIN", "PHENOBARB", "VALPROATE", "CBMZ"   .res Assessment: Plan:    Will increase Gabapentin to 300 mg 1-2 capsules at bedtime to possibly improve sleep initiation. Discussed that she may also want to adjust the administration time of Gabapentin to closer to bedtime to determine if this is helpful for sleep. Discussed that increased dose may help reset sleep cycle and then she could resume 300 mg at bedtime if 600 mg is no longer needed for sleep initiation. Advised pt to contact office if sleep disturbance does not improve or if she has excessive somnolence with higher dose of Gabapentin.  Will continue Geodon 20 mg in the morning and 60 mg at bedtime for mood stabilization.  Continue Lamictal 150 mg po qd for mood stabilization.  Pt to  follow-up in 6 months or sooner if clinically indicated.  Patient advised to contact office with any questions, adverse effects, or acute worsening in signs and symptoms.  Tracy Russo was seen today for insomnia and follow-up.  Diagnoses and all orders for this visit:  Anxiety state -     gabapentin (NEURONTIN) 300 MG capsule; Take 1-2 capsules at bedtime  Bipolar I disorder -     lamoTRIgine (LAMICTAL) 150 MG tablet; Take 1 tablet (150 mg total) by mouth daily. -     ziprasidone (GEODON) 60 MG capsule; Take 1 capsule (60 mg total) by mouth at bedtime. -     ziprasidone (GEODON) 20 MG capsule; Take 1 capsule (20 mg total) by mouth every morning.     Please see After Visit Summary for patient specific instructions.  Future Appointments  Date Time Provider Department Center  08/10/2023  8:30 AM [name], NP [facility] None  09/21/2023  8:30 AM Corie Chiquito, PMHNP [facility] None    No orders of the defined types were placed in this encounter.   -------------------------------

## 2023-06-22 ENCOUNTER — Telehealth: Payer: Self-pay

## 2023-06-22 NOTE — Telephone Encounter (Signed)
Patient called requesting information regarding her last Tdap/Tetanus vaccine. Patient was notified the last shot was done on 06/26/16. Patient mentioned she was working out in her yard and cut her hand on a wire in her yard. No concerns or symptoms with the recent injury.   Patient was made aware that the provider will be notified of the incident and that we will reach out to her with any recommendation. Per provider's, patient will need an updated tetanus shot. Tried calling the patient to  schedule a nurse visit. No answer. Lvm requesting that the patient return a call back for scheduling. Direct call back info provided.

## 2023-06-24 ENCOUNTER — Ambulatory Visit: Payer: Managed Care, Other (non HMO)

## 2023-07-13 ENCOUNTER — Other Ambulatory Visit (HOSPITAL_COMMUNITY)
Admission: RE | Admit: 2023-07-13 | Discharge: 2023-07-13 | Disposition: A | Discharge status: Home / Self Care | Payer: Managed Care, Other (non HMO) | Source: Ambulatory Visit | Attending: Nurse Practitioner | Admitting: Nurse Practitioner

## 2023-07-13 ENCOUNTER — Other Ambulatory Visit: Payer: Self-pay | Admitting: Nurse Practitioner

## 2023-07-13 DIAGNOSIS — Z124 Encounter for screening for malignant neoplasm of cervix: Secondary | ICD-10-CM | POA: Diagnosis present

## 2023-07-15 LAB — CYTOLOGY - PAP
Comment: NEGATIVE
Diagnosis: NEGATIVE
High risk HPV: NEGATIVE

## 2023-08-10 ENCOUNTER — Encounter: Payer: Managed Care, Other (non HMO) | Admitting: Medical-Surgical

## 2023-08-20 ENCOUNTER — Encounter: Payer: Self-pay | Admitting: Medical-Surgical

## 2023-08-20 ENCOUNTER — Ambulatory Visit (INDEPENDENT_AMBULATORY_CARE_PROVIDER_SITE_OTHER): Payer: Managed Care, Other (non HMO) | Admitting: Medical-Surgical

## 2023-08-20 VITALS — BP 102/71 | HR 90 | Resp 20 | Ht 68.0 in | Wt 182.7 lb

## 2023-08-20 DIAGNOSIS — F1099 Alcohol use, unspecified with unspecified alcohol-induced disorder: Secondary | ICD-10-CM | POA: Insufficient documentation

## 2023-08-20 DIAGNOSIS — Z23 Encounter for immunization: Secondary | ICD-10-CM | POA: Diagnosis not present

## 2023-08-20 DIAGNOSIS — Z0000 Encounter for general adult medical examination without abnormal findings: Secondary | ICD-10-CM | POA: Diagnosis not present

## 2023-08-20 DIAGNOSIS — I10 Essential (primary) hypertension: Secondary | ICD-10-CM | POA: Diagnosis not present

## 2023-08-20 MED ORDER — FAMOTIDINE 20 MG PO TABS: 20.0000 mg | ORAL_TABLET | Freq: Two times a day (BID) | ORAL | 3 refills | Status: DC

## 2023-08-20 MED ORDER — LISINOPRIL 10 MG PO TABS: 10.0000 mg | ORAL_TABLET | Freq: Every day | ORAL | 1 refills | Status: DC

## 2023-08-20 MED ORDER — FEXOFENADINE HCL 180 MG PO TABS: 180.0000 mg | ORAL_TABLET | Freq: Every day | ORAL | 1 refills | Status: DC

## 2023-08-20 NOTE — Progress Notes (Signed)
Complete physical exam  Patient: Tracy Russo   DOB: Mar 24, 1991   32 y.o. Female  MRN: [id number]  Subjective:    Chief Complaint  Patient presents with   Annual Exam   Cheryllynn Tivnan Gossman is a 32 y.o. female who presents today for a complete physical exam. She reports consuming a general diet.  Treadmill and walking for exercise.  She generally feels well. She reports sleeping well. She does not have additional problems to discuss today.    Most recent fall risk assessment:    02/04/2023    1:47 PM  Fall Risk   Falls in the past year? 0  Number falls in past yr: 0  Injury with Fall? 0  Risk for fall due to : No Fall Risks  Follow up Falls evaluation completed     Most recent depression screenings:    02/04/2023    1:47 PM 09/22/2022    9:23 AM  PHQ 2/9 Scores  PHQ - 2 Score 0 0  PHQ- 9 Score  0    Vision:Within last year, Dental: No current dental problems and Receives regular dental care, and STD: The patient denies history of sexually transmitted disease.    Patient Care Team: [name], NP as PCP - General (Nurse Practitioner)   Outpatient Medications Prior to Visit  Medication Sig   ciclopirox (PENLAC) 8 % solution Apply topically at bedtime. Apply over nail and surrounding skin. Apply daily over previous coat. After seven (7) days, may remove with alcohol and continue cycle.   etonogestrel (NEXPLANON) 68 MG IMPL implant See admin instructions.   gabapentin (NEURONTIN) 300 MG capsule Take 1-2 capsules at bedtime   ziprasidone (GEODON) 60 MG capsule Take 1 capsule (60 mg total) by mouth at bedtime.   [DISCONTINUED] famotidine (PEPCID) 20 MG tablet Take 1 tablet (20 mg total) by mouth 2 (two) times daily.   [DISCONTINUED] lisinopril (ZESTRIL) 10 MG tablet Take 1 tablet (10 mg total) by mouth daily.   lamoTRIgine (LAMICTAL) 150 MG tablet Take 1 tablet (150 mg total) by mouth daily.   ziprasidone (GEODON) 20 MG capsule Take 1 capsule (20 mg total) by mouth every  morning.   [DISCONTINUED] fexofenadine (ALLEGRA ALLERGY) 180 MG tablet Take 1 tablet (180 mg total) by mouth daily.   No facility-administered medications prior to visit.   Review of Systems  Constitutional:  Negative for chills, fever, malaise/fatigue and weight loss.  HENT:  Negative for congestion, ear pain, hearing loss, sinus pain and sore throat.   Eyes:  Negative for blurred vision, photophobia and pain.  Respiratory:  Negative for cough, shortness of breath and wheezing.   Cardiovascular:  Negative for chest pain, palpitations and leg swelling.  Gastrointestinal:  Negative for abdominal pain, constipation, diarrhea, heartburn, nausea and vomiting.  Genitourinary:  Negative for dysuria, frequency and urgency.  Musculoskeletal:  Negative for falls and neck pain.  Skin:  Negative for itching and rash.  Neurological:  Negative for dizziness, weakness and headaches.  Endo/Heme/Allergies:  Negative for polydipsia. Does not bruise/bleed easily.  Psychiatric/Behavioral:  Negative for depression, substance abuse and suicidal ideas. The patient is not nervous/anxious.      Objective:    BP 102/71 (BP Location: Right Arm, Cuff Size: Normal)   Pulse 90   Resp 20   Ht 5\' 8"  (1.727 m)   Wt 182 lb 11.2 oz (82.9 kg)   SpO2 96%   BMI 27.78 kg/m    Physical Exam Vitals reviewed.  Constitutional:  General: She is not in acute distress.    Appearance: Normal appearance. She is not ill-appearing.  HENT:     Head: Normocephalic and atraumatic.     Right Ear: Tympanic membrane, ear canal and external ear normal. There is no impacted cerumen.     Left Ear: Tympanic membrane, ear canal and external ear normal. There is no impacted cerumen.     Nose: Nose normal. No congestion or rhinorrhea.     Mouth/Throat:     Mouth: Mucous membranes are moist.     Pharynx: No oropharyngeal exudate or posterior oropharyngeal erythema.  Eyes:     General: No scleral icterus.       Right eye: No  discharge.        Left eye: No discharge.     Extraocular Movements: Extraocular movements intact.     Conjunctiva/sclera: Conjunctivae normal.     Pupils: Pupils are equal, round, and reactive to light.  Neck:     Thyroid: No thyromegaly.     Vascular: No carotid bruit or JVD.     Trachea: Trachea normal.  Cardiovascular:     Rate and Rhythm: Normal rate and regular rhythm.     Pulses: Normal pulses.     Heart sounds: Normal heart sounds. No murmur heard.    No friction rub. No gallop.  Pulmonary:     Effort: Pulmonary effort is normal. No respiratory distress.     Breath sounds: Normal breath sounds. No wheezing.  Abdominal:     General: Bowel sounds are normal. There is no distension.     Palpations: Abdomen is soft.     Tenderness: There is no abdominal tenderness. There is no guarding.  Musculoskeletal:        General: Normal range of motion.     Cervical back: Normal range of motion and neck supple.  Lymphadenopathy:     Cervical: No cervical adenopathy.  Skin:    General: Skin is warm and dry.  Neurological:     Mental Status: She is alert and oriented to person, place, and time.     Cranial Nerves: No cranial nerve deficit.  Psychiatric:        Mood and Affect: Mood normal.        Behavior: Behavior normal.        Thought Content: Thought content normal.        Judgment: Judgment normal.   No results found for any visits on 08/20/23.     Assessment & Plan:    Routine Health Maintenance and Physical Exam  Immunization History  Administered Date(s) Administered   Influenza, Seasonal, Injecte, Preservative Fre 08/20/2023   Influenza,inj,Quad PF,6+ Mos 11/09/2022   PFIZER(Purple Top)SARS-COV-2 Vaccination 02/10/2020, 03/06/2020   Tdap 06/23/2023    Health Maintenance  Topic Date Due   COVID-19 Vaccine (3 - Pfizer risk series) 09/05/2023 (Originally 04/03/2020)   Hepatitis C Screening  08/19/2024 (Originally 04/04/2009)   HIV Screening  08/19/2024 (Originally  04/04/2006)   Cervical Cancer Screening (HPV/Pap Cotest)  07/12/2028   DTaP/Tdap/Td (2 - Td or Tdap) 06/22/2033   INFLUENZA VACCINE  Completed   HPV VACCINES  Aged Out   Discussed health benefits of physical activity, and encouraged her to engage in regular exercise appropriate for her age and condition.  1. Annual physical exam Checking labs as below. UTD on preventative care. Wellness information provided with AVS. - CBC with Differential/Platelet - Comprehensive metabolic panel  2. Essential hypertension Checking labs. BP at goal. Continue  Lisinopril.  - CBC with Differential/Platelet - Comprehensive metabolic panel - Lipid panel  3. Alcohol use, unspecified with unspecified alcohol-induced disorder (HCC) Reports consuming 1-2 alcoholic beverages about once every 2 weeks. Denies concerns for abuse and is aware of recommendations for healthy intake.   4. Need for influenza vaccination Flu vaccine given in office today.  - Flu vaccine trivalent PF, 6mos and older(Flulaval,Afluria,Fluarix,Fluzone)  Return in about 6 months (around 02/17/2024) for HTN follow up.   [name], NP

## 2023-08-21 ENCOUNTER — Encounter: Payer: Self-pay | Admitting: Medical-Surgical

## 2023-08-21 LAB — LIPID PANEL
Chol/HDL Ratio: 4.2 ratio (ref 0.0–4.4)
Cholesterol, Total: 175 mg/dL (ref 100–199)
HDL: 42 mg/dL (ref >39)
LDL Chol Calc (NIH): 115 mg/dL — ABNORMAL HIGH (ref 0–99)
Triglycerides: 96 mg/dL (ref 0–149)
VLDL Cholesterol Cal: 18 mg/dL (ref 5–40)

## 2023-08-21 LAB — COMPREHENSIVE METABOLIC PANEL
ALT: 18 IU/L (ref 0–32)
AST: 20 IU/L (ref 0–40)
Albumin: 4.7 g/dL (ref 3.9–4.9)
Alkaline Phosphatase: 76 IU/L (ref 44–121)
BUN/Creatinine Ratio: 10 (ref 9–23)
BUN: 14 mg/dL (ref 6–20)
Bilirubin Total: 0.3 mg/dL (ref 0.0–1.2)
CO2: 25 mmol/L (ref 20–29)
Calcium: 9.5 mg/dL (ref 8.7–10.2)
Chloride: 99 mmol/L (ref 96–106)
Creatinine, Ser: 1.41 mg/dL — ABNORMAL HIGH (ref 0.57–1.00)
Globulin, Total: 2.3 g/dL (ref 1.5–4.5)
Glucose: 58 mg/dL — ABNORMAL LOW (ref 70–99)
Potassium: 4.1 mmol/L (ref 3.5–5.2)
Sodium: 142 mmol/L (ref 134–144)
Total Protein: 7 g/dL (ref 6.0–8.5)
eGFR: 51 mL/min/{1.73_m2} — ABNORMAL LOW (ref >59)

## 2023-08-21 LAB — CBC WITH DIFFERENTIAL/PLATELET
Basophils Absolute: 0 10*3/uL (ref 0.0–0.2)
Basos: 0 %
EOS (ABSOLUTE): 0.1 10*3/uL (ref 0.0–0.4)
Eos: 1 %
Hematocrit: 40.5 % (ref 34.0–46.6)
Hemoglobin: 13.5 g/dL (ref 11.1–15.9)
Immature Grans (Abs): 0 10*3/uL (ref 0.0–0.1)
Immature Granulocytes: 0 %
Lymphocytes Absolute: 1.4 10*3/uL (ref 0.7–3.1)
Lymphs: 16 %
MCH: 30.4 pg (ref 26.6–33.0)
MCHC: 33.3 g/dL (ref 31.5–35.7)
MCV: 91 fL (ref 79–97)
Monocytes Absolute: 0.7 10*3/uL (ref 0.1–0.9)
Monocytes: 8 %
Neutrophils Absolute: 6.5 10*3/uL (ref 1.4–7.0)
Neutrophils: 75 %
Platelets: 305 10*3/uL (ref 150–450)
RBC: 4.44 x10E6/uL (ref 3.77–5.28)
RDW: 12.3 % (ref 11.7–15.4)
WBC: 8.7 10*3/uL (ref 3.4–10.8)

## 2023-09-21 ENCOUNTER — Ambulatory Visit: Payer: 59 | Admitting: Psychiatry

## 2023-09-21 ENCOUNTER — Encounter: Payer: Self-pay | Admitting: Psychiatry

## 2023-09-21 DIAGNOSIS — F411 Generalized anxiety disorder: Secondary | ICD-10-CM

## 2023-09-21 DIAGNOSIS — F319 Bipolar disorder, unspecified: Secondary | ICD-10-CM

## 2023-09-21 MED ORDER — LAMOTRIGINE 150 MG PO TABS: 150.0000 mg | ORAL_TABLET | Freq: Every day | ORAL | 1 refills | Status: DC

## 2023-09-21 MED ORDER — ZIPRASIDONE HCL 60 MG PO CAPS: 60.0000 mg | ORAL_CAPSULE | Freq: Every day | ORAL | 1 refills | Status: DC

## 2023-09-21 MED ORDER — ZIPRASIDONE HCL 20 MG PO CAPS: 20.0000 mg | ORAL_CAPSULE | Freq: Every morning | ORAL | 1 refills | Status: DC

## 2023-09-21 MED ORDER — GABAPENTIN 300 MG PO CAPS: ORAL_CAPSULE | ORAL | 1 refills | Status: DC

## 2023-09-21 NOTE — Progress Notes (Signed)
Riverly Hogsed Remo [id number] Nov 11, 1991 32 y.o.  Subjective:   Patient ID:  Tracy Russo is a 32 y.o. (DOB 01-17-1991) female.  Chief Complaint:  Chief Complaint  Patient presents with   Follow-up    Bipolar Disorder, anxiety, and sleep disturbance    HPI Tracy Russo presents to the office today for follow-up of Bipolar Disorder, anxiety, and sleep disturbance.   She reports that there have been some changes at work that have been stressful. She reports that in July she started having increased anxiety. 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 She reports that she was having intrusive thoughts about this and re-started therapy, which has been helpful. She reports anxiety has improved since re-starting therapy in August. She reports that her anxiety is manageable.   Denies any recent sleepless nights. She reports that she will sometimes take 2 Gabapentin capsules when she is anxious or anticipates difficulty sleeping. Going to bed around 9:30 pm and is getting up around 6-7 am.   She reports some mild depression in response to multiple work changes when she returned from vacation. Denies any manic symptoms or severe irritability. Energy and motivation have been ok overall. Concentration has been ok. Appetite was good. Denies SI.   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   Past Psychiatric Medication Trials: Abilify-ineffective Zyprexa-ineffective Geodon- effective Gabapentin Depakote Lithium Gabapentin Hydroxyzine Propanolol Trazodone  AIMS    Flowsheet Row Office Visit from 09/21/2023 in [facility] Office Visit from 03/09/2023 in [facility] Office Visit from 09/08/2022 in [facility] Office Visit from 06/09/2022 in [facility] Office Visit from 11/04/2021 [facility]  AIMS Total Score 0 0 0 0 0      GAD-7    Flowsheet Row Office Visit from 09/22/2022 in [facility] at [facility]  Total GAD-7 Score 2      PHQ2-9    Flowsheet Row Office Visit from 02/04/2023 in [facility] at [facility] Office Visit from 09/22/2022 in [facility] at [facility]  PHQ-2 Total Score 0 0  PHQ-9 Total Score -- 0      Flowsheet Row ED from 08/13/2022 in [facility] ED from 05/04/2022 in [facility] at [facility] ED from 01/09/2022 in [facility] at [facility] RISK CATEGORY Error: Question 6 not populated No Risk No Risk        Review of Systems:  Review of Systems  Musculoskeletal:  Negative for gait problem.  Neurological:  Negative for tremors.  Psychiatric/Behavioral:         Please refer to HPI   Recent elevated Cr level. Plan is to re-check Cr level.  Medications: I have reviewed the patient's current medications.  Current Outpatient Medications  Medication Sig Dispense Refill   etonogestrel (NEXPLANON) 68 MG IMPL implant See admin instructions.     famotidine (PEPCID) 20 MG tablet Take 1 tablet (20 mg total) by mouth 2 (two) times daily. 180 tablet 3   fexofenadine (ALLEGRA ALLERGY) 180 MG tablet Take 1 tablet (180 mg total) by mouth daily. 90 tablet 1   lisinopril (ZESTRIL) 10 MG tablet Take 1 tablet (10 mg total) by mouth daily. 90 tablet 1   ciclopirox (  PENLAC) 8 % solution Apply topically at bedtime. Apply over nail and surrounding skin. Apply daily over previous coat. After seven (7) days, may remove with alcohol and continue cycle. 6 mL 5   gabapentin (NEURONTIN) 300 MG capsule Take 1-2 capsules at bedtime 180 capsule 1   lamoTRIgine (LAMICTAL) 150 MG tablet Take 1 tablet (150 mg total) by mouth daily. 90 tablet 1   ziprasidone (GEODON) 20 MG  capsule Take 1 capsule (20 mg total) by mouth every morning. 90 capsule 1   ziprasidone (GEODON) 60 MG capsule Take 1 capsule (60 mg total) by mouth at bedtime. 90 capsule 1   No current facility-administered medications for this visit.    Medication Side Effects: None  Allergies: No Known Allergies  Past Medical History:  Diagnosis Date   Bipolar disorder (HCC)    GERD (gastroesophageal reflux disease)    HTN (hypertension)     Past Medical History, Surgical history, Social history, and Family history were reviewed and updated as appropriate.   Please see review of systems for further details on the patient's review from today.   Objective:   Physical Exam:  There were no vitals taken for this visit.  Physical Exam Constitutional:      General: She is not in acute distress. Musculoskeletal:        General: No deformity.  Neurological:     Mental Status: She is alert and oriented to person, place, and time.     Coordination: Coordination normal.  Psychiatric:        Attention and Perception: Attention and perception normal. She does not perceive auditory or visual hallucinations.        Mood and Affect: Mood normal. Mood is not anxious or depressed. Affect is not labile, blunt, angry or inappropriate.        Speech: Speech normal.        Behavior: Behavior normal.        Thought Content: Thought content normal. Thought content is not paranoid or delusional. Thought content does not include homicidal or suicidal ideation. Thought content does not include homicidal or suicidal plan.        Cognition and Memory: Cognition and memory normal.        Judgment: Judgment normal.     Comments: Insight intact     Lab Review:     Component Value Date/Time   NA 142 08/20/2023 1441   K 4.1 08/20/2023 1441   CL 99 08/20/2023 1441   CO2 25 08/20/2023 1441   GLUCOSE 58 (L) 08/20/2023 1441   GLUCOSE 86 08/13/2022 1006   BUN 14 08/20/2023 1441   CREATININE 1.41 (H) 08/20/2023  1441   CREATININE 1.16 (H) 08/13/2022 1006   CALCIUM 9.5 08/20/2023 1441   PROT 7.0 08/20/2023 1441   ALBUMIN 4.7 08/20/2023 1441   AST 20 08/20/2023 1441   ALT 18 08/20/2023 1441   ALKPHOS 76 08/20/2023 1441   BILITOT 0.3 08/20/2023 1441   GFRNONAA >60 10/30/2016 2144   GFRAA >60 10/30/2016 2144       Component Value Date/Time   WBC 8.7 08/20/2023 1441   WBC 7.1 08/13/2022 1006   RBC 4.44 08/20/2023 1441   RBC 4.30 08/13/2022 1006   HGB 13.5 08/20/2023 1441   HCT 40.5 08/20/2023 1441   PLT 305 08/20/2023 1441   MCV 91 08/20/2023 1441   MCH 30.4 08/20/2023 1441   MCH 28.4 08/13/2022 1006   MCHC 33.3 08/20/2023 1441   MCHC 32.7 08/13/2022  1006   RDW 12.3 08/20/2023 1441   LYMPHSABS 1.4 08/20/2023 1441   EOSABS 0.1 08/20/2023 1441   BASOSABS 0.0 08/20/2023 1441    Lithium Lvl  Date Value Ref Range Status  04/07/2022 <0.3 (L) 0.6 - 1.2 mmol/L Final     No results found for: "PHENYTOIN", "PHENOBARB", "VALPROATE", "CBMZ"   .res Assessment: Plan:    32 minutes spent dedicated to the care of this patient on the date of this encounter to include pre-visit review of records, ordering of medication, post visit documentation, and face-to-face time with the patient discussing recent increase in anxiety and re-starting therapy. Pt reports that her anxiety is now manageable and feels that a change in medication is not needed at this time. She reports that she would like to continue current medications without changes.  Will continue Geodon 20 mg in the morning and 60 mg at bedtime for mood stabilization.  Continue Lamictal 150 mg daily for mood stabilization. Continue Geodon 300 mg 1-2 capsules at bedtime for insomnia and anxiety.  Recommend continuing psychotherapy.  Pt to follow-up in 6 months or sooner if clinically indicated.  Patient advised to contact office with any questions, adverse effects, or acute worsening in signs and symptoms.    Tracy Russo was seen today for  follow-up.  Diagnoses and all orders for this visit:  Bipolar I disorder (HCC) -     lamoTRIgine (LAMICTAL) 150 MG tablet; Take 1 tablet (150 mg total) by mouth daily. -     ziprasidone (GEODON) 60 MG capsule; Take 1 capsule (60 mg total) by mouth at bedtime. -     ziprasidone (GEODON) 20 MG capsule; Take 1 capsule (20 mg total) by mouth every morning.  Anxiety state -     gabapentin (NEURONTIN) 300 MG capsule; Take 1-2 capsules at bedtime     Please see After Visit Summary for patient specific instructions.  Future Appointments  Date Time Provider Department Center  02/17/2024  9:30 AM [name], NP [facility] None  03/21/2024  8:30 AM Corie Chiquito, PMHNP [facility] None    No orders of the defined types were placed in this encounter.   -------------------------------

## 2023-09-28 IMAGING — DX DG CHEST 2V
2 series · 2 of 2 positions shown · non-contrast
Comparison: None.

CLINICAL DATA: Cough

EXAM:
CHEST - 2 VIEW

[chest pa]
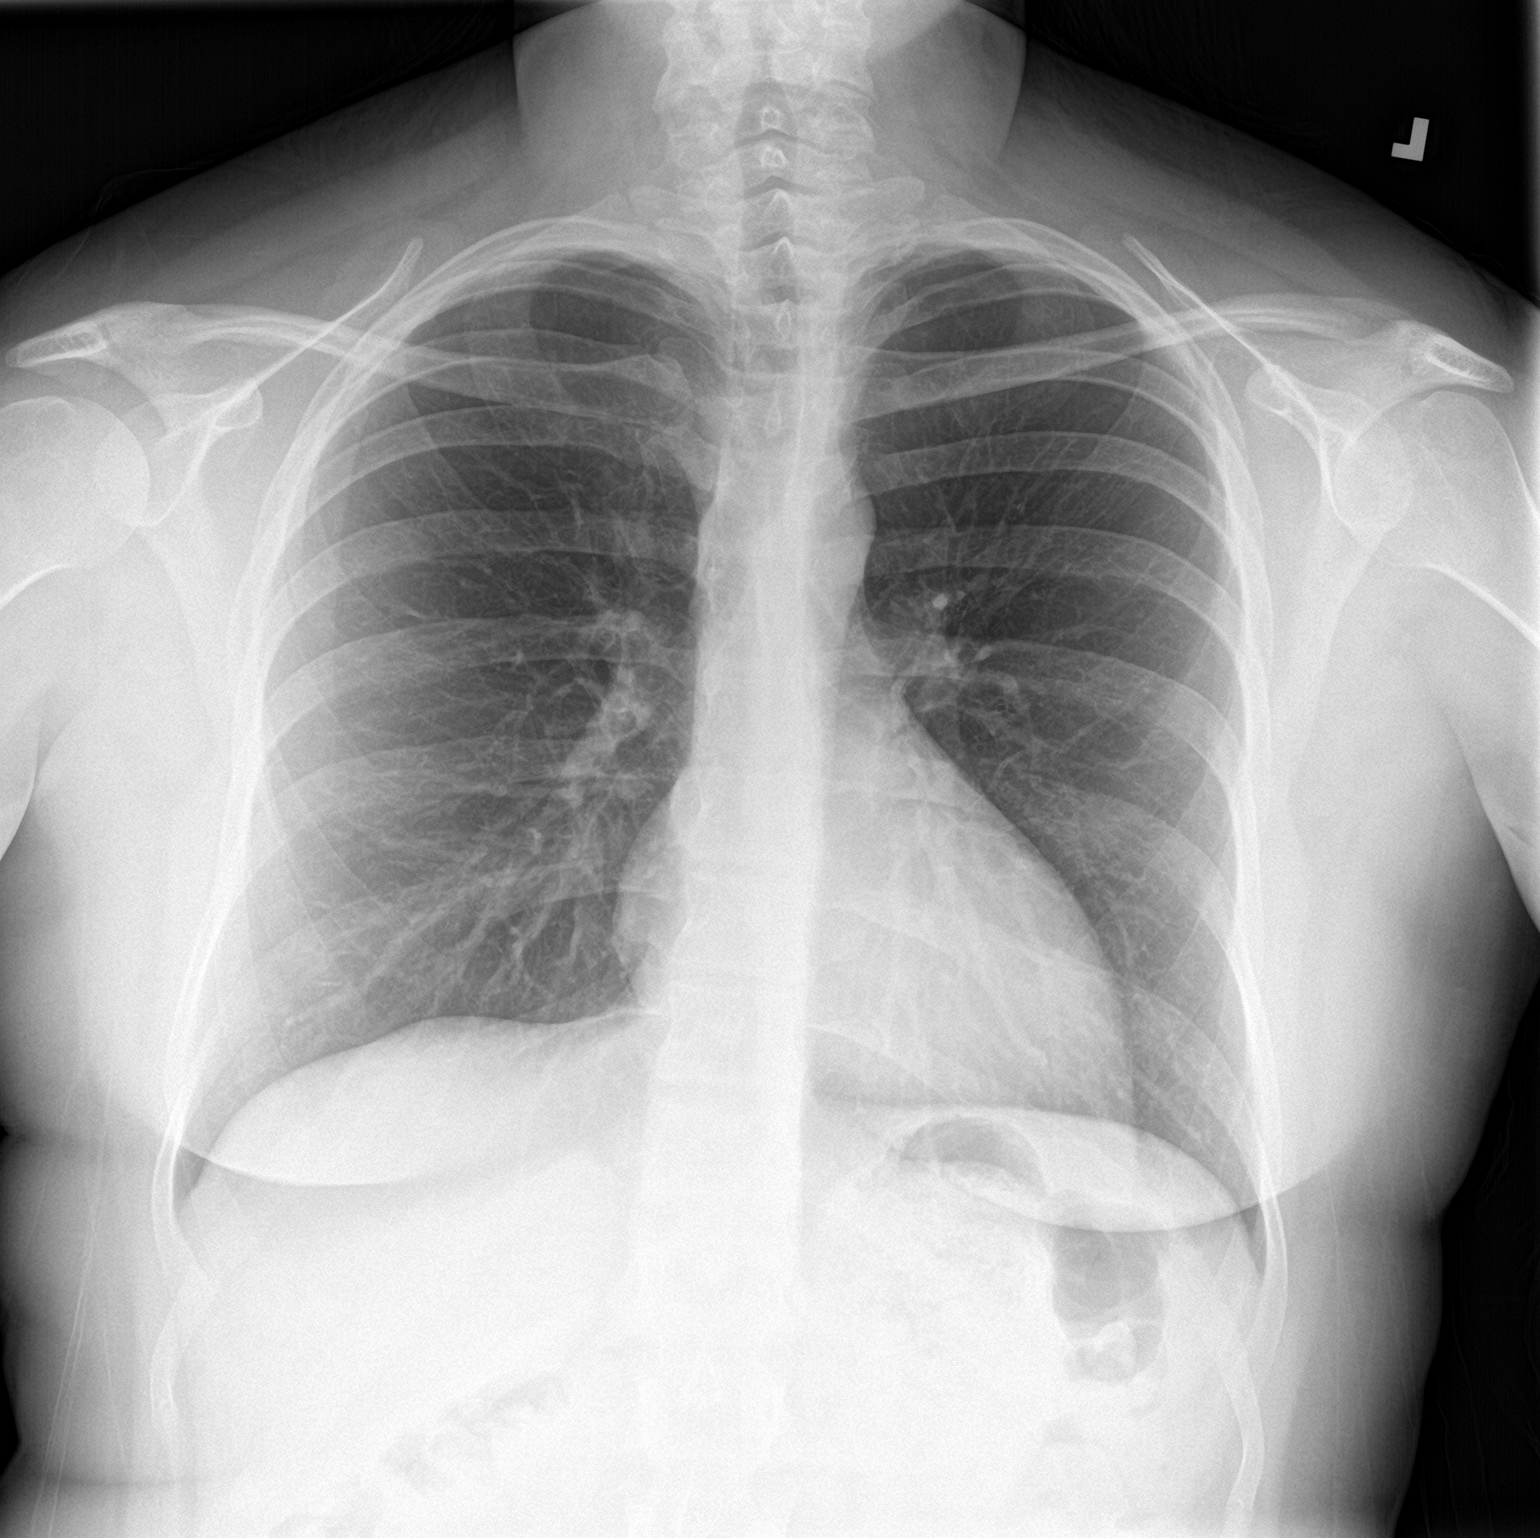

[chest lat]
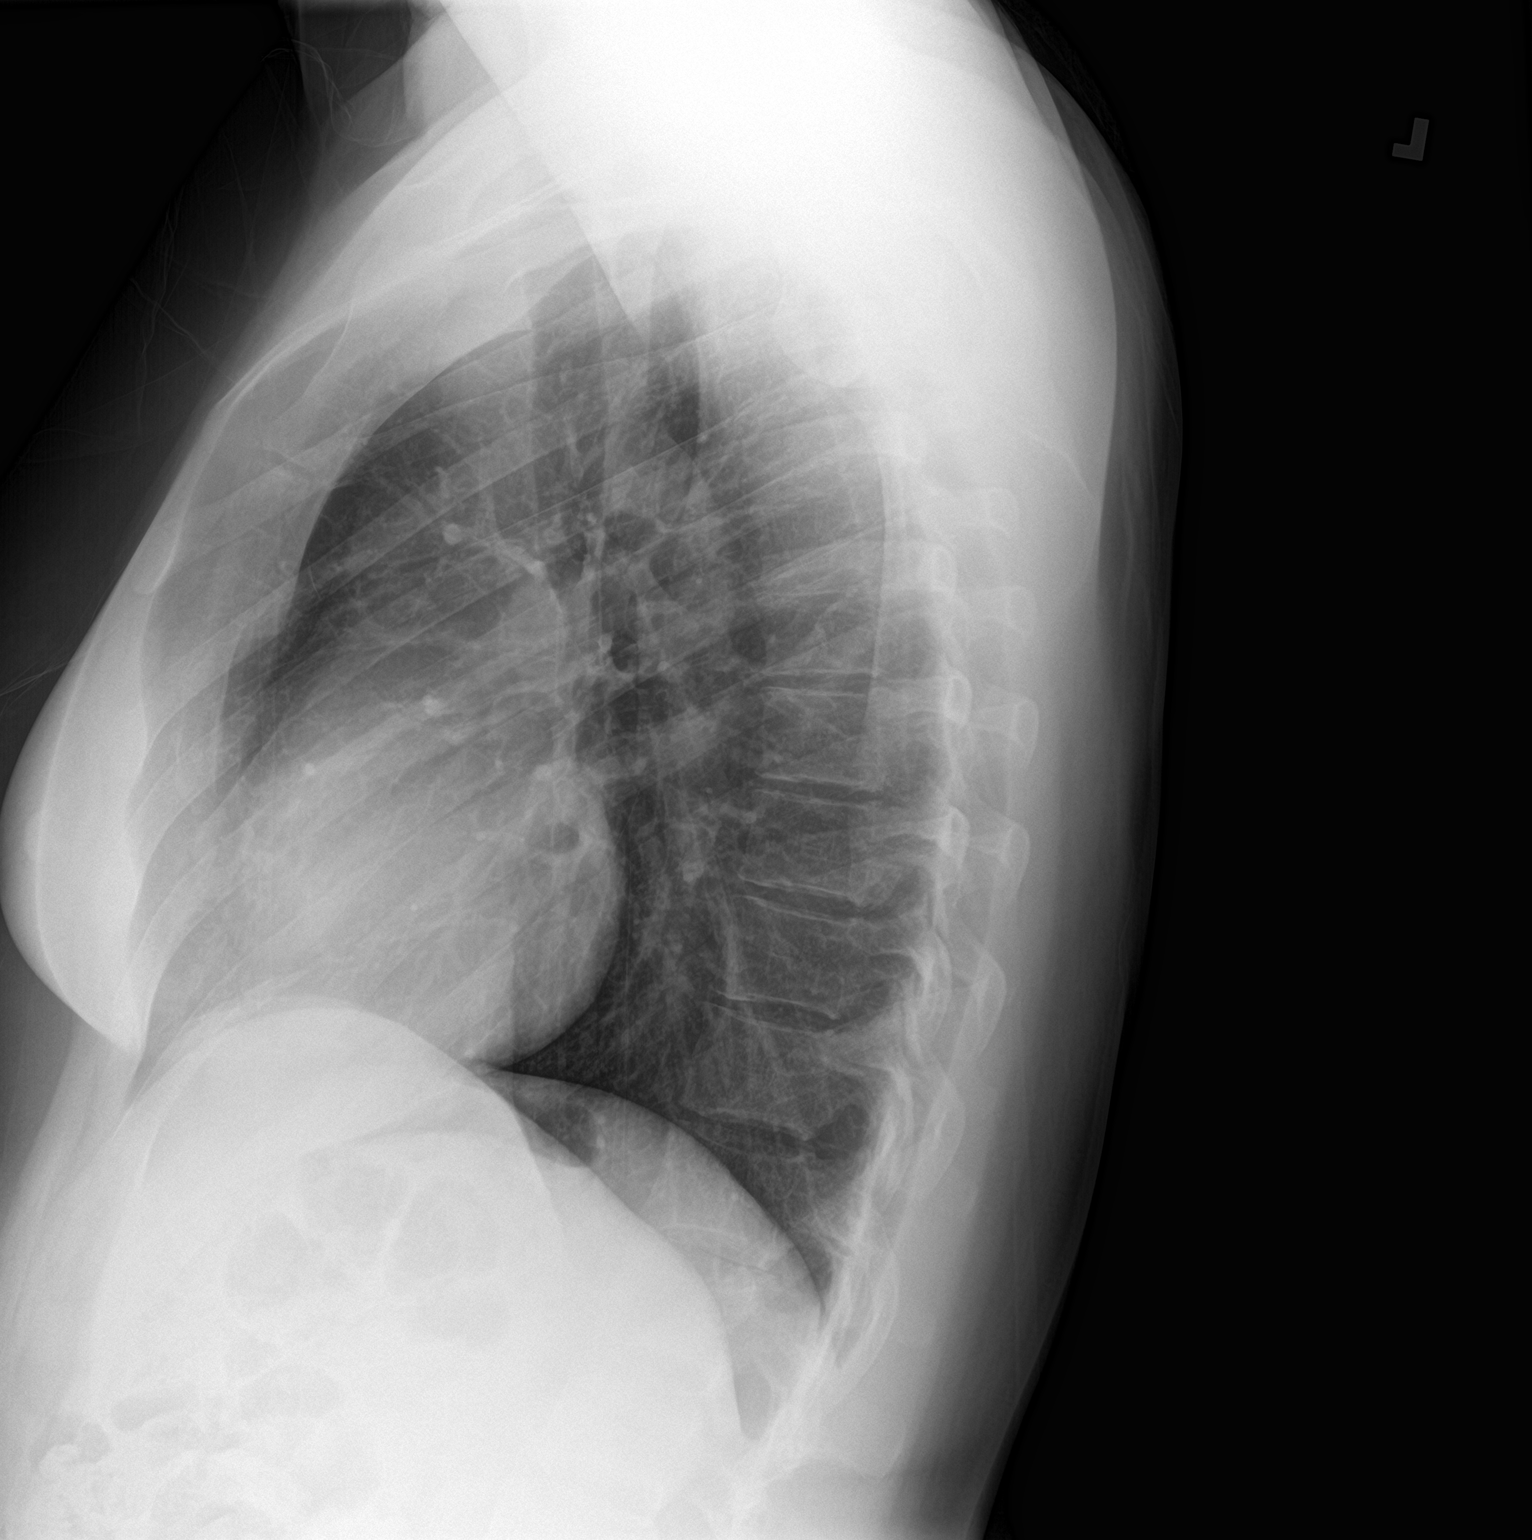

[2 of 2 positions shown; findings below may reference images not displayed]

FINDINGS: The heart size and mediastinal contours are within normal limits.
Both lungs are clear. The visualized skeletal structures are
unremarkable.
IMPRESSION: No active cardiopulmonary disease.

## 2023-10-04 ENCOUNTER — Encounter: Payer: Self-pay | Admitting: Medical-Surgical

## 2023-10-04 ENCOUNTER — Encounter (INDEPENDENT_AMBULATORY_CARE_PROVIDER_SITE_OTHER): Payer: Managed Care, Other (non HMO) | Admitting: Medical-Surgical

## 2023-10-04 ENCOUNTER — Other Ambulatory Visit: Payer: Self-pay | Admitting: Medical-Surgical

## 2023-10-04 VITALS — BP 113/72 | HR 77 | Resp 20 | Ht 68.0 in | Wt 181.4 lb

## 2023-10-04 DIAGNOSIS — R7989 Other specified abnormal findings of blood chemistry: Secondary | ICD-10-CM

## 2023-10-05 ENCOUNTER — Encounter: Payer: Self-pay | Admitting: Medical-Surgical

## 2023-10-05 DIAGNOSIS — R7989 Other specified abnormal findings of blood chemistry: Secondary | ICD-10-CM

## 2023-10-05 DIAGNOSIS — R944 Abnormal results of kidney function studies: Secondary | ICD-10-CM

## 2023-10-05 LAB — BASIC METABOLIC PANEL
BUN/Creatinine Ratio: 9 (ref 9–23)
BUN: 12 mg/dL (ref 6–20)
CO2: 22 mmol/L (ref 20–29)
Calcium: 9.7 mg/dL (ref 8.7–10.2)
Chloride: 100 mmol/L (ref 96–106)
Creatinine, Ser: 1.38 mg/dL — ABNORMAL HIGH (ref 0.57–1.00)
Glucose: 87 mg/dL (ref 70–99)
Potassium: 4.3 mmol/L (ref 3.5–5.2)
Sodium: 137 mmol/L (ref 134–144)
eGFR: 52 mL/min/{1.73_m2} — ABNORMAL LOW (ref >59)

## 2023-10-13 ENCOUNTER — Encounter: Payer: Self-pay | Admitting: Psychiatry

## 2023-11-03 ENCOUNTER — Telehealth: Payer: Self-pay | Admitting: Psychiatry

## 2023-11-03 DIAGNOSIS — F319 Bipolar disorder, unspecified: Secondary | ICD-10-CM

## 2023-11-03 NOTE — Telephone Encounter (Addendum)
Called patient. She is reporting a lot of changes at work so things seem to be more situational than clinical. Reports being very sensitive, when she cries can't seem to stop. I asked her if she cried on weekends and she said she was fine over TG. She reports crying occasionally over the past month, but crying daily while at work this week. Doesn't usually cry in the morning. Having trouble focusing at work. Sleeping ok. Able to do ADLs. Rates anxiety as 6/10, says she feels down but can't rate depression. She questions if ? SAD, thinks she had same issues in Fall 2022. I looked at notes from Fall 2022 and there were a lot of issues with lithium and abnormal labs, as well as BP issues, but don't see mention of the same issues reported today. Fall 2023 does report some sensitivity issues, easily distracted with her phone, some crying. She is scheduled to FU with Rosey Bath in April. Please advise.

## 2023-11-03 NOTE — Telephone Encounter (Signed)
Pt lvm that she is crying all the time. She thinks her meds need to be changed. Please call her at [phone]

## 2023-11-04 MED ORDER — LAMOTRIGINE 200 MG PO TABS: 200.0000 mg | ORAL_TABLET | Freq: Every day | ORAL | 1 refills | Status: DC

## 2023-11-04 NOTE — Telephone Encounter (Signed)
Recommend increasing Lamictal to 200 mg daily to stabilize mood and improve recent symptoms. A script has been sent to her pharmacy. Please advise her that it can take 2 weeks for Lamictal to reach a steady state after an increase, so she may not see an immediate improvement in symptoms. Please advise her to contact office if symptoms worsen or do not improve.

## 2023-11-04 NOTE — Telephone Encounter (Signed)
LVM to RC 

## 2023-11-05 NOTE — Telephone Encounter (Signed)
Called patient with the recommendations and she is aware that Rx was sent.

## 2023-12-22 DIAGNOSIS — N182 Chronic kidney disease, stage 2 (mild): Secondary | ICD-10-CM | POA: Insufficient documentation

## 2024-02-17 ENCOUNTER — Encounter: Payer: Self-pay | Admitting: Medical-Surgical

## 2024-02-17 ENCOUNTER — Ambulatory Visit: Payer: Managed Care, Other (non HMO) | Admitting: Medical-Surgical

## 2024-02-17 VITALS — BP 96/65 | HR 74 | Resp 20 | Ht 68.0 in | Wt 180.2 lb

## 2024-02-17 DIAGNOSIS — I10 Essential (primary) hypertension: Secondary | ICD-10-CM

## 2024-02-17 DIAGNOSIS — K219 Gastro-esophageal reflux disease without esophagitis: Secondary | ICD-10-CM | POA: Diagnosis not present

## 2024-02-17 DIAGNOSIS — J302 Other seasonal allergic rhinitis: Secondary | ICD-10-CM

## 2024-02-17 MED ORDER — FEXOFENADINE HCL 180 MG PO TABS: 180.0000 mg | ORAL_TABLET | Freq: Every day | ORAL | 3 refills | Status: AC

## 2024-02-17 MED ORDER — LISINOPRIL 10 MG PO TABS: 10.0000 mg | ORAL_TABLET | Freq: Every day | ORAL | 3 refills | Status: DC

## 2024-02-17 MED ORDER — FAMOTIDINE 20 MG PO TABS: 20.0000 mg | ORAL_TABLET | Freq: Two times a day (BID) | ORAL | 3 refills | Status: DC

## 2024-02-17 NOTE — Progress Notes (Unsigned)
   Established patient visit  History, exam, impression, and plan:  1. Cough, unspecified type 2. Sore throat Tracy Russo 33 year old female presenting today with reports of upper respiratory symptoms.  Notes that this started approximately 5 days ago with her nose and eyes burning.  On Saturday, she felt unwell and by Sunday she notes that she felt awful.  Her throat has been sore and she has been coughing.  Notes that her cough has been occasionally productive of small amounts of pink-tinged sputum, usually in the morning.  Her left ear has now developed pressure/discomfort and she has significant sinus congestion.  She has tried drinking hot tea and increasing her fluid consumption.  She has also used Chloraseptic for the sore throat.  Continues to have significant issues with hoarseness.  She saw ENT and they recommended just using Atrovent nasal spray twice daily and follow-up with them after a couple of months.  POCT strep, flu, and COVID testing all negative today.  Below for physical exam. - POCT rapid strep A - POCT Influenza A/B - POC COVID-19  3. Viral URI with cough Despite negative testing, suspect that her symptoms are truly related to a viral URI.  Discussed the timeline for resolution of a viral illness since symptoms can last 7 to 14 days.  With her severe hoarseness and significant sinus congestion, treating with Decadron 4 mg twice daily.  Adding Tussionex for cough suppression.  Okay to use Tylenol/ibuprofen for fever/discomfort.  Continue conservative measures at home.  If improvement in symptoms not noted over the next 2 to 3 days or symptoms improved but quickly worsen again, consider adding antibiotic therapy for secondary bacterial infection.   Procedures performed this visit: None.  Return if symptoms worsen or fail to improve.  __________________________________ Thayer Ohm, DNP, APRN, FNP-BC Primary Care and Sports Medicine [facility] [place]

## 2024-02-18 ENCOUNTER — Encounter: Payer: Self-pay | Admitting: Medical-Surgical

## 2024-03-21 ENCOUNTER — Ambulatory Visit: Payer: 59 | Admitting: Psychiatry

## 2024-03-21 ENCOUNTER — Ambulatory Visit: Payer: 59 | Admitting: Physician Assistant

## 2024-03-21 ENCOUNTER — Encounter: Payer: Self-pay | Admitting: Physician Assistant

## 2024-03-21 DIAGNOSIS — F319 Bipolar disorder, unspecified: Secondary | ICD-10-CM

## 2024-03-21 DIAGNOSIS — F422 Mixed obsessional thoughts and acts: Secondary | ICD-10-CM | POA: Diagnosis not present

## 2024-03-21 DIAGNOSIS — F411 Generalized anxiety disorder: Secondary | ICD-10-CM

## 2024-03-21 MED ORDER — ZIPRASIDONE HCL 60 MG PO CAPS: 60.0000 mg | ORAL_CAPSULE | Freq: Every day | ORAL | 1 refills | Status: DC

## 2024-03-21 MED ORDER — LAMOTRIGINE 200 MG PO TABS: 300.0000 mg | ORAL_TABLET | Freq: Every day | ORAL | Status: DC

## 2024-03-21 MED ORDER — ZIPRASIDONE HCL 20 MG PO CAPS: 20.0000 mg | ORAL_CAPSULE | Freq: Every morning | ORAL | 1 refills | Status: DC

## 2024-03-21 NOTE — Progress Notes (Signed)
 [facility]  Patient ID: Tracy Russo,  MRN: [id number]  PCP: Cherre Cornish, NP  Date of Evaluation: 03/21/2024 Time spent:30 minutes  Chief Complaint:  Chief Complaint   Depression; Follow-up    HISTORY/CURRENT STATUS: HPI former patient of Roselyn Connor, NP who is no longer with the practice.  Feels like meds are working well as far as depression goes. Patient is able to enjoy things.  Energy and motivation are good.  Work is going well.  Is a Set designer.  No extreme sadness, tearfulness, or feelings of hopelessness.  Sleeps well most of the time. ADLs and personal hygiene are normal.   Denies any changes in concentration, making decisions, or remembering things.  Appetite has not changed.  Weight is stable.   Denies suicidal or homicidal thoughts.  More anxious lately.  Ruminating about things, obsesses about a fire in the house, or if the doors are locked or the oven is off. Will check things over and over, some days turns around to make sure the garage door is closed.  Has been dx w/ OCD long ago, but sx are much worse now, for past 6-8 months. Also washes her hands a lot after cooking with meat or eggs.   Patient denies increased energy with decreased need for sleep, increased talkativeness, racing thoughts, impulsivity or risky behaviors, increased spending, increased libido, grandiosity, increased irritability or anger, paranoia, or hallucinations.  Review of Systems  Constitutional: Negative.   HENT: Negative.    Eyes: Negative.   Respiratory: Negative.    Cardiovascular: Negative.   Gastrointestinal: Negative.   Genitourinary: Negative.   Musculoskeletal: Negative.   Skin: Negative.   Neurological: Negative.   Endo/Heme/Allergies: Negative.   Psychiatric/Behavioral:         See HPI   Individual Medical History/ Review of Systems: Changes? :No   Past Psychiatric Medication Trials: Abilify-ineffective Zyprexa -ineffective Geodon -  effective Gabapentin  Depakote Lithium -has CKD now  Gabapentin  Hydroxyzine  Propanolol Trazodone   Manic episode 1st time in fall of 2017, hospitalized in [place] for 2 weeks and then 2 weeks her [place].   No suicide attempts.   Allergies: Patient has no known allergies.  Current Medications:  Current Outpatient Medications:    etonogestrel (NEXPLANON) 68 MG IMPL implant, See admin instructions., Disp: , Rfl:    famotidine  (PEPCID ) 20 MG tablet, Take 1 tablet (20 mg total) by mouth 2 (two) times daily., Disp: 180 tablet, Rfl: 3   fexofenadine  (ALLEGRA  ALLERGY) 180 MG tablet, Take 1 tablet (180 mg total) by mouth daily., Disp: 90 tablet, Rfl: 3   gabapentin  (NEURONTIN ) 300 MG capsule, Take 1-2 capsules at bedtime, Disp: 180 capsule, Rfl: 1   lisinopril  (ZESTRIL ) 10 MG tablet, Take 1 tablet (10 mg total) by mouth daily., Disp: 90 tablet, Rfl: 3   lamoTRIgine  (LAMICTAL ) 200 MG tablet, Take 1.5 tablets (300 mg total) by mouth daily., Disp: , Rfl:    ziprasidone  (GEODON ) 20 MG capsule, Take 1 capsule (20 mg total) by mouth every morning., Disp: 90 capsule, Rfl: 1   ziprasidone  (GEODON ) 60 MG capsule, Take 1 capsule (60 mg total) by mouth at bedtime., Disp: 90 capsule, Rfl: 1 Medication Side Effects: none  Family Medical/ Social History: Changes? No  MENTAL HEALTH EXAM:  There were no vitals taken for this visit.There is no height or weight on file to calculate BMI.  General Appearance: Casual and Well Groomed  Eye Contact:  Good  Speech:  Clear and Coherent and Normal  Rate  Volume:  Normal  Mood:  Euthymic  Affect:  Congruent  Thought Process:  Goal Directed and Descriptions of Associations: Circumstantial  Orientation:  Full (Time, Place, and Person)  Thought Content: Logical   Suicidal Thoughts:  No  Homicidal Thoughts:  No  Memory:  WNL  Judgement:  Good  Insight:  Good  Psychomotor Activity:  Normal  Concentration:  Concentration: Good  Recall:   Good  Fund of Knowledge: Good  Language: Good  Assets:  Communication Skills Desire for Improvement Financial Resources/Insurance Housing Transportation Vocational/Educational  ADL's:  Intact  Cognition: WNL  Prognosis:  Good   Labs 10/04/2023 BMP glucose 87, creatinine 1.3 which is stable, GFR 52 which is stable all others completely normal  08/20/2023 Lipid panel cholesterol 175, HDL 42, LDL 115, triglycerides 96  DIAGNOSES:    ICD-10-CM   1. Anxiety state  F41.1     2. Bipolar I disorder (HCC)  F31.9 lamoTRIgine  (LAMICTAL ) 200 MG tablet    ziprasidone  (GEODON ) 20 MG capsule    ziprasidone  (GEODON ) 60 MG capsule    3. Mixed obsessional thoughts and acts  F42.2      Receiving Psychotherapy: Yes  [place] [facility]  RECOMMENDATIONS:   PDMP reviewed.  Gabapentin  filled 12/13/2023. I provided 30 minutes of face to face time during this encounter, including time spent before and after the visit in records review, medical decision making, counseling pertinent to today's visit, and charting.   We discussed the OCD.  It is not controlled.  There are several different options, we could add an SSRI or clomipramine, but that could increase the risk of mania.  Another option would be increasing Geodon  to help with generalized anxiety, Lamictal  may do the same thing.  Neither of us  want to add another medication, and especially not brisk and manic episodes.  We agree that the best choice for now is increasing the Lamictal .  She is tolerating it well and she has noticed with each increase in the past that her mood and anxiety have been better.  She was not having as much OCD symptoms then however.  We also discussed starting Xanax or another benzodiazepine if needed.  She can call between now and her next visit if she feels that it would be beneficial.  Continue gabapentin  300 mg, 1-2 nightly.  (She usually takes 1) Increase Lamictal  to 100 mg to 1.5 pills daily. Continue  Geodon  20 mg, 1 p.o. every morning and 60 mg, 1 p.o. nightly. Continue therapy. Return in 6 to 8 weeks.  Marvia Slocumb, PA-C

## 2024-03-23 ENCOUNTER — Other Ambulatory Visit: Payer: Self-pay

## 2024-03-23 DIAGNOSIS — F411 Generalized anxiety disorder: Secondary | ICD-10-CM

## 2024-03-23 MED ORDER — GABAPENTIN 300 MG PO CAPS: ORAL_CAPSULE | ORAL | 0 refills | Status: DC

## 2024-04-19 ENCOUNTER — Ambulatory Visit: Admitting: Physician Assistant

## 2024-04-19 ENCOUNTER — Encounter: Payer: Self-pay | Admitting: Physician Assistant

## 2024-04-19 VITALS — BP 103/70 | HR 73 | Ht 68.0 in | Wt 177.0 lb

## 2024-04-19 DIAGNOSIS — N898 Other specified noninflammatory disorders of vagina: Secondary | ICD-10-CM | POA: Diagnosis not present

## 2024-04-19 LAB — POCT URINALYSIS DIP (CLINITEK)
Bilirubin, UA: NEGATIVE
Blood, UA: NEGATIVE
Glucose, UA: NEGATIVE mg/dL
Ketones, POC UA: NEGATIVE mg/dL
Leukocytes, UA: NEGATIVE
Nitrite, UA: NEGATIVE
POC PROTEIN,UA: NEGATIVE
Spec Grav, UA: 1.01 (ref 1.010–1.025)
Urobilinogen, UA: 0.2 U/dL
pH, UA: 7 (ref 5.0–8.0)

## 2024-04-19 NOTE — Progress Notes (Signed)
   Acute Office Visit  Subjective:     Patient ID: Tracy Russo, female    DOB: Dec 05, 1990, 33 y.o.   MRN: [id number]  Chief Complaint  Patient presents with   Urinary Tract Infection    HPI Patient is in today for external vaginal irritation for the last day. She does not have history of UTI's. She denies any discharge or vaginal odor. She did just get dx with CKD and concerned about UTI that is not treated. She denies any abdominal or flank pain. She has no dysuria. She denies any fever, chills, nausea, vomiting. She has not done anything to make better or worse. She does admit to changing her soap recently.   ROS See HPI.     Objective:    BP 103/70 (BP Location: Left Arm, Patient Position: Sitting, Cuff Size: Normal)   Pulse 73   Ht 5\' 8"  (1.727 m)   Wt 177 lb (80.3 kg)   SpO2 100%   BMI 26.91 kg/m  BP Readings from Last 3 Encounters:  04/19/24 103/70  02/17/24 96/65  10/04/23 113/72   Wt Readings from Last 3 Encounters:  04/19/24 177 lb (80.3 kg)  02/17/24 180 lb 3.2 oz (81.7 kg)  10/04/23 181 lb 6.4 oz (82.3 kg)   .Tracy Russo Results for orders placed or performed in visit on 04/19/24  POCT URINALYSIS DIP (CLINITEK)   Collection Time: 04/19/24  7:37 AM  Result Value Ref Range   Color, UA yellow yellow   Clarity, UA clear clear   Glucose, UA negative negative mg/dL   Bilirubin, UA negative negative   Ketones, POC UA negative negative mg/dL   Spec Grav, UA 3.244 0.102 - 1.025   Blood, UA negative negative   pH, UA 7.0 5.0 - 8.0   POC PROTEIN,UA negative negative, trace   Urobilinogen, UA 0.2 0.2 or 1.0 E.U./dL   Nitrite, UA Negative Negative   Leukocytes, UA Negative Negative       Physical Exam Constitutional:      Appearance: Normal appearance.  HENT:     Head: Normocephalic.  Cardiovascular:     Rate and Rhythm: Normal rate and regular rhythm.  Pulmonary:     Effort: Pulmonary effort is normal.  Abdominal:     General: There is no distension.      Palpations: Abdomen is soft. There is no mass.     Tenderness: There is no abdominal tenderness. There is no right CVA tenderness, left CVA tenderness, guarding or rebound.  Neurological:     Mental Status: She is alert.  Psychiatric:        Mood and Affect: Mood normal.          Assessment & Plan:  Tracy AasAaron AasTrini was seen today for urinary tract infection.  Diagnoses and all orders for this visit:  Vaginal irritation -     POCT URINALYSIS DIP (CLINITEK) -     Urine Culture   UA negative for blood, leuks, protein, nitrites.  Will culture.  Suspect more external vaginal irritation due to potentially soap change.  No discharge or odor will not do wet prep Drink more water and switch back to previous soap and follow up as needed.    Tracy Talarico, PA-C

## 2024-04-21 ENCOUNTER — Ambulatory Visit: Payer: Self-pay | Admitting: Physician Assistant

## 2024-04-21 LAB — URINE CULTURE: Organism ID, Bacteria: NO GROWTH

## 2024-04-21 NOTE — Progress Notes (Signed)
 No bacterial growth on urine culture.

## 2024-05-01 ENCOUNTER — Other Ambulatory Visit: Payer: Self-pay

## 2024-05-01 DIAGNOSIS — F319 Bipolar disorder, unspecified: Secondary | ICD-10-CM

## 2024-05-01 MED ORDER — LAMOTRIGINE 200 MG PO TABS: 300.0000 mg | ORAL_TABLET | Freq: Every day | ORAL | 0 refills | Status: DC

## 2024-05-12 ENCOUNTER — Encounter: Payer: Self-pay | Admitting: Physician Assistant

## 2024-05-12 ENCOUNTER — Ambulatory Visit: Admitting: Physician Assistant

## 2024-05-12 DIAGNOSIS — F422 Mixed obsessional thoughts and acts: Secondary | ICD-10-CM | POA: Diagnosis not present

## 2024-05-12 DIAGNOSIS — F319 Bipolar disorder, unspecified: Secondary | ICD-10-CM

## 2024-05-12 DIAGNOSIS — F411 Generalized anxiety disorder: Secondary | ICD-10-CM | POA: Diagnosis not present

## 2024-05-12 MED ORDER — LAMOTRIGINE 150 MG PO TABS: 300.0000 mg | ORAL_TABLET | Freq: Every day | ORAL | 1 refills | Status: DC

## 2024-05-12 NOTE — Progress Notes (Signed)
 [facility]  Patient ID: Tracy Russo,  MRN: [id number]  PCP: Cherre Cornish, NP  Date of Evaluation: 05/12/2024 Time spent:20 minutes  Chief Complaint:  Chief Complaint   Depression; Follow-up    HISTORY/CURRENT STATUS: HPI  For routine med check.  Lamictal  was increased at her visit 2 months ago.  She has found it beneficial with the anxiety and OCD symptoms.  She does ruminate at times but it is not nearly as severe as prior to increasing the dose.  She is able to think more logically about whether she turned off the stove or what ever the case may be. No c/o extreme anxiety.   No reports of depression.  ADLs and personal hygiene are normal.  Appetite is normal, weight is stable.  Work is going well.  She sleeps well but sometimes does ruminate before going to sleep.  She is taking gabapentin  300 to 600 mg to help stop that.  It is effective.  No SI/HI.  Denies dizziness, syncope, seizures, numbness, tingling, tremor, tics, unsteady gait, slurred speech, confusion. Denies muscle or joint pain, stiffness, or dystonia.  Individual Medical History/ Review of Systems: Changes? :No   Past Psychiatric Medication Trials: Abilify-ineffective Zyprexa -ineffective Geodon - effective Gabapentin  Depakote Lithium -has CKD now  Gabapentin  Hydroxyzine  Propanolol Trazodone   Manic episode 1st time in fall of 2017, hospitalized in [place] for 2 weeks and then 2 weeks her [place].   No suicide attempts.   Allergies: Patient has no known allergies.  Current Medications:  Current Outpatient Medications:    etonogestrel (NEXPLANON) 68 MG IMPL implant, See admin instructions., Disp: , Rfl:    famotidine  (PEPCID ) 20 MG tablet, Take 1 tablet (20 mg total) by mouth 2 (two) times daily., Disp: 180 tablet, Rfl: 3   fexofenadine  (ALLEGRA  ALLERGY) 180 MG tablet, Take 1 tablet (180 mg total) by mouth daily., Disp: 90 tablet, Rfl: 3   gabapentin  (NEURONTIN ) 300 MG  capsule, Take 1-2 capsules at bedtime, Disp: 180 capsule, Rfl: 0   lamoTRIgine  (LAMICTAL ) 150 MG tablet, Take 2 tablets (300 mg total) by mouth daily., Disp: 180 tablet, Rfl: 1   lisinopril  (ZESTRIL ) 10 MG tablet, Take 1 tablet (10 mg total) by mouth daily., Disp: 90 tablet, Rfl: 3   ziprasidone  (GEODON ) 20 MG capsule, Take 1 capsule (20 mg total) by mouth every morning., Disp: 90 capsule, Rfl: 1   ziprasidone  (GEODON ) 60 MG capsule, Take 1 capsule (60 mg total) by mouth at bedtime., Disp: 90 capsule, Rfl: 1 Medication Side Effects: none  Family Medical/ Social History: Changes? No  MENTAL HEALTH EXAM:  There were no vitals taken for this visit.There is no height or weight on file to calculate BMI.  General Appearance: Casual and Well Groomed  Eye Contact:  Good  Speech:  Clear and Coherent and Normal Rate  Volume:  Normal  Mood:  Euthymic  Affect:  Congruent  Thought Process:  Goal Directed and Descriptions of Associations: Circumstantial  Orientation:  Full (Time, Place, and Person)  Thought Content: Logical   Suicidal Thoughts:  No  Homicidal Thoughts:  No  Memory:  WNL  Judgement:  Good  Insight:  Good  Psychomotor Activity:  Normal  Concentration:  Concentration: Good  Recall:  Good  Fund of Knowledge: Good  Language: Good  Assets:  Communication Skills Desire for Improvement Financial Resources/Insurance Housing Resilience Transportation Vocational/Educational  ADL's:  Intact  Cognition: WNL  Prognosis:  Good   DIAGNOSES:    ICD-10-CM  1. Bipolar I disorder (HCC)  F31.9     2. Mixed obsessional thoughts and acts  F42.2     3. Anxiety state  F41.1      Receiving Psychotherapy: Yes  [place] [facility]  RECOMMENDATIONS:   PDMP reviewed.  Gabapentin  filled 03/23/2024. I provided approximately 20 minutes of face to face time during this encounter, including time spent before and after the visit in records review, medical decision making, counseling  pertinent to today's visit, and charting.   I am glad to see her doing better.  No changes need to be made.  Continue gabapentin  300 mg, 1-2 nightly. Continue Lamictal  150 mg, 2 p.o. daily. Continue Geodon  20 mg, 1 p.o. every morning and 60 mg, 1 p.o. nightly. Continue therapy. Return in 5 months, (before the holidays.)  Marvia Slocumb, PA-C

## 2024-05-21 ENCOUNTER — Ambulatory Visit
Admission: EM | Admit: 2024-05-21 | Discharge: 2024-05-21 | Disposition: A | Discharge status: Home / Self Care | Attending: Family Medicine | Admitting: Family Medicine

## 2024-05-21 ENCOUNTER — Other Ambulatory Visit: Payer: Self-pay

## 2024-05-21 DIAGNOSIS — S61012A Laceration without foreign body of left thumb without damage to nail, initial encounter: Secondary | ICD-10-CM | POA: Diagnosis not present

## 2024-05-21 NOTE — ED Triage Notes (Signed)
 Lac to left thumb since 1220 while cutting onions. Last tetanus last year.

## 2024-05-21 NOTE — ED Provider Notes (Signed)
 [facility]    CSN: [id number] Arrival date & time: 05/21/24  1250      History   Chief Complaint Chief Complaint  Patient presents with   Laceration    HPI Tracy Russo is a 33 y.o. female.   Patient accidentally cut her thumb with a knife while cutting onions at home.  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      Home Medications    Prior to Admission medications   Medication Sig Start Date End Date Taking? Authorizing Provider  etonogestrel (NEXPLANON) 68 MG IMPL implant See admin instructions.    [provider]  famotidine  (PEPCID ) 20 MG tablet Take 1 tablet (20 mg total) by mouth 2 (two) times daily. 02/17/24   Willo Mini, NP  fexofenadine  (ALLEGRA  ALLERGY) 180 MG tablet Take 1 tablet (180 mg total) by mouth daily. 02/17/24   Willo Mini, NP  gabapentin  (NEURONTIN ) 300 MG capsule Take 1-2 capsules at bedtime 03/23/24   Rhys Boyer T, PA-C  lamoTRIgine  (LAMICTAL ) 150 MG tablet Take 2 tablets (300 mg total) by mouth daily. 05/12/24   Rhys Boyer DASEN, PA-C  lisinopril  (ZESTRIL ) 10 MG tablet Take 1 tablet (10 mg total) by mouth daily. 02/17/24   Willo Mini, NP  ziprasidone  (GEODON ) 20 MG capsule Take 1 capsule (20 mg total) by mouth every morning. 03/21/24   Rhys Boyer T, PA-C  ziprasidone  (GEODON ) 60 MG capsule Take 1 capsule (60 mg total) by mouth at bedtime. 03/21/24   Rhys Boyer DASEN, PA-C     Family History Family History  Problem Relation Age of Onset   Bipolar disorder Mother    Skin cancer Mother    Hypertension Father     Social History Social History   Tobacco Use   Smoking status: Never   Smokeless tobacco: Never  Vaping Use   Vaping status: Never Used  Substance Use Topics   Alcohol use: Yes    Comment: 0-1 per week   Drug use: Never     Allergies   Patient has no known allergies.   Review of Systems Review of Systems See HPI  Physical Exam Triage Vital Signs ED Triage Vitals [05/21/24 1256]  Encounter Vitals Group     BP 109/74     Girls Systolic BP Percentile      Girls Diastolic BP Percentile      Boys Systolic BP Percentile      Boys Diastolic BP Percentile      Pulse Rate 67     Resp 16     Temp 97.9 F (36.6 C)     Temp src      SpO2 99 %     Weight      Height      Head Circumference      Peak Flow      Pain Score 2  Pain Loc      Pain Education      Exclude from Growth Chart    No data found.  Updated Vital Signs BP 109/74   Pulse 67   Temp 97.9 F (36.6 C)   Resp 16   SpO2 99%       Physical Exam Constitutional:      General: She is not in acute distress.    Appearance: She is well-developed.  HENT:     Head: Normocephalic and atraumatic.   Eyes:     Conjunctiva/sclera: Conjunctivae normal.     Pupils: Pupils are equal, round, and reactive to light.    Cardiovascular:     Rate and Rhythm: Normal rate.  Pulmonary:     Effort: Pulmonary effort is normal. No respiratory distress.   Musculoskeletal:        General: Normal range of motion.     Cervical back: Normal range of motion.   Skin:    General: Skin is warm and dry.     Comments: 2 cm laceration on the pad of the thumb at the tip, longitudinally up to the nail, but not through it   Neurological:     Mental Status: She is alert.      UC Treatments / Results  Labs (all labs ordered are listed, but only abnormal results are  displayed) Labs Reviewed - No data to display  EKG   Radiology No results found.  Procedures Laceration Repair  Date/Time: 05/21/2024 1:51 PM  Performed by: Maranda Jamee Jacob, MD Authorized by: Maranda Jamee Jacob, MD   Consent:    Consent obtained:  Verbal   Consent given by:  Patient   Risks discussed:  Infection Universal protocol:    Patient identity confirmed:  Verbally with patient Anesthesia:    Anesthesia method:  Local infiltration   Local anesthetic:  Lidocaine  1% w/o epi Laceration details:    Location:  Finger   Finger location:  L thumb   Length (cm):  2   Depth (mm):  5 Pre-procedure details:    Preparation:  Patient was prepped and draped in usual sterile fashion Exploration:    Hemostasis achieved with:  Direct pressure   Imaging outcome: foreign body not noted     Wound exploration: wound explored through full range of motion   Treatment:    Area cleansed with:  Chlorhexidine   Amount of cleaning:  Standard   Undermining:  None Skin repair:    Repair method:  Sutures   Suture size:  4-0   Suture material:  Nylon   Suture technique:  Simple interrupted   Number of sutures:  3 Approximation:    Approximation:  Close Repair type:    Repair type:  Simple Post-procedure details:    Dressing:  Non-adherent dressing   Procedure completion:  Tolerated  (including critical care time)  Medications Ordered in UC Medications - No data to display  Initial Impression / Assessment and Plan / UC Course  I have reviewed the triage vital signs and the nursing notes.  Pertinent labs & imaging results that were available during my care of the patient were reviewed by me and considered in my medical decision making (see chart for details).     Discussed closure with tape, glue, sutures.  Patient prefers sutures because she uses her hands a lot. Final Clinical Impressions(s) / UC Diagnoses   Final diagnoses:  Laceration of skin of left thumb, initial  encounter  Discharge Instructions      Keep area clean Try to keep reasonably dry.  May pat it dry if it becomes briefly wet.  Do not soak Sutures out in 7 days   ED Prescriptions   None    PDMP not reviewed this encounter.   Maranda Jamee Jacob, MD 05/21/24 1352

## 2024-05-21 NOTE — Discharge Instructions (Signed)
 Keep area clean Try to keep reasonably dry.  May pat it dry if it becomes briefly wet.  Do not soak Sutures out in 7 days

## 2024-05-28 ENCOUNTER — Ambulatory Visit
Admission: EM | Admit: 2024-05-28 | Discharge: 2024-05-28 | Disposition: A | Discharge status: Home / Self Care | Attending: Medical-Surgical | Admitting: Medical-Surgical

## 2024-05-28 ENCOUNTER — Other Ambulatory Visit: Payer: Self-pay

## 2024-05-28 DIAGNOSIS — Z4802 Encounter for removal of sutures: Secondary | ICD-10-CM

## 2024-05-28 NOTE — ED Triage Notes (Signed)
Pt presents for suture removal.

## 2024-06-08 ENCOUNTER — Telehealth: Payer: Self-pay

## 2024-06-08 ENCOUNTER — Encounter: Payer: Self-pay | Admitting: Medical-Surgical

## 2024-06-08 NOTE — Telephone Encounter (Signed)
 Spoke with patient . States she was seen at [facility] health urgent care  Stitches were placed

## 2024-06-08 NOTE — Telephone Encounter (Signed)
 Spoke with patient Seen at [facility] urgent care on 05/21/2024 for finger laceration  States stitches were placed on this day She returned on 05/28/2024 for stitches removal. States that  one was very close to nail bed.  States that at this area there is still a small opening - not currently bleeding but still is open.  Sending message to Zada Palin for review as has been 11 days since stitches were removed.

## 2024-06-08 NOTE — Telephone Encounter (Signed)
 Spoke with patient . She will take a picture and attach it to a Mychart message and send to [facility] .

## 2024-06-08 NOTE — Telephone Encounter (Signed)
 Copied from CRM [id number]. Topic: Clinical - Medical Advice >> Jun 08, 2024  7:42 AM Laurier BROCKS wrote: Reason for CRM: Patient states she had stiches placed on left thumb and patient states they were removed. Patient is still seeing an open. Patient can be reached at [phone]

## 2024-06-22 ENCOUNTER — Ambulatory Visit: Payer: Self-pay

## 2024-06-22 NOTE — Telephone Encounter (Signed)
 FYI Only or Action Required?: Action required by provider: request for appointment.  Patient was last seen in primary care on 04/19/2024 by Antoniette Vermell CROME, PA-C.  Called Nurse Triage reporting Flank Pain.  Symptoms began yesterday.  Interventions attempted: Nothing.  Symptoms are: gradually worsening.  Triage Disposition: See Physician Within 24 Hours  Patient/caregiver understands and will follow disposition?: Yes                             Copied from CRM #[id number]. Topic: Clinical - Red Word Triage >> Jun 22, 2024  4:52 PM Graeme ORN wrote: Red Word that prompted transfer to Nurse Triage: Pain in kidneys - started yesterday - today on both sides. Has kidney disease. Reason for Disposition  MODERATE pain (e.g., interferes with normal activities or awakens from sleep)  Answer Assessment - Initial Assessment Questions 1. LOCATION: Where does it hurt? (e.g., left, right)     Both sides, radiates around to front of abdomen 2. ONSET: When did the pain start?     Yesterday 3. SEVERITY: How bad is the pain? (e.g., Scale 1-10; mild, moderate, or severe)     Rates pain a 4 at this time  4. PATTERN: Does the pain come and go, or is it constant?      Relatively constant today 5. CAUSE: What do you think is causing the pain?     Unsure, states she noticed she was dehydrated today  6. OTHER SYMPTOMS:  Do you have any other symptoms? (e.g., fever, abdomen pain, vomiting, leg weakness, burning with urination, blood in urine)      Mild abdominal pain, denies painful urination, denies urinary frequency, denies NV, denies fever    Has stage 2 kidney disease, states she went to nephrologist earlier this month. Advised patient to be seen within 24 hours. No availability in office until next Friday (06/30/24). Please advise on scheduling.  Protocols used: Flank Pain-A-AH

## 2024-06-23 ENCOUNTER — Encounter: Payer: Self-pay | Admitting: Family Medicine

## 2024-06-23 ENCOUNTER — Other Ambulatory Visit: Payer: Self-pay | Admitting: Physician Assistant

## 2024-06-23 ENCOUNTER — Ambulatory Visit: Admitting: Family Medicine

## 2024-06-23 VITALS — BP 121/79 | HR 72 | Temp 98.1°F | Resp 18 | Ht 68.0 in | Wt 177.8 lb

## 2024-06-23 DIAGNOSIS — R82998 Other abnormal findings in urine: Secondary | ICD-10-CM

## 2024-06-23 DIAGNOSIS — F411 Generalized anxiety disorder: Secondary | ICD-10-CM

## 2024-06-23 DIAGNOSIS — R109 Unspecified abdominal pain: Secondary | ICD-10-CM

## 2024-06-23 LAB — POCT URINALYSIS DIP (CLINITEK)
Bilirubin, UA: NEGATIVE
Glucose, UA: NEGATIVE mg/dL
Ketones, POC UA: NEGATIVE mg/dL
Nitrite, UA: NEGATIVE
POC PROTEIN,UA: NEGATIVE
Spec Grav, UA: <=1.005 — AB (ref 1.010–1.025)
Urobilinogen, UA: 0.2 U/dL
pH, UA: 5 (ref 5.0–8.0)

## 2024-06-23 MED ORDER — CIPROFLOXACIN HCL 250 MG PO TABS: 250.0000 mg | ORAL_TABLET | Freq: Two times a day (BID) | ORAL | 0 refills | Status: AC

## 2024-06-23 NOTE — Progress Notes (Signed)
 Established Patient Office Visit  Subjective   Patient ID: Tracy Russo, female    DOB: 01-Feb-1991  Age: 33 y.o. MRN: [id number]  No chief complaint on file.   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 She had labs done and had elevated creatinine. Was sent to Nephrologist and seen them on 7/1 with urine and BMP tests. Reviewed today with creatinine of 1.2. She reports she's has 1 week hx of left flank pain and now on the right. Pain rate 4/10.  She says the pain comes and goes but worse at night last night. Feels crampy. LMP 7/7/-12th irregular due to her Nexplanon. She denies dark urine, no dysuria or hematuria.    Review of Systems  Gastrointestinal:  Positive for abdominal pain.  Genitourinary:  Positive for flank pain.  All other systems reviewed and are negative.     Objective:     There were no vitals taken for this visit. BP Readings from Last 3 Encounters:  06/23/24 121/79  05/21/24 109/74  04/19/24 103/70      Physical Exam Vitals and nursing note reviewed.  Constitutional:      Appearance: Normal appearance. She is normal weight.  HENT:     Head: Normocephalic and atraumatic.     Right Ear: External ear normal.     Left Ear: External ear normal.     Nose: Nose normal.     Mouth/Throat:     Mouth: Mucous membranes are moist.     Pharynx: Oropharynx is clear.  Eyes:     Conjunctiva/sclera: Conjunctivae normal.     Pupils: Pupils are equal, round, and reactive to light.  Cardiovascular:     Rate and Rhythm: Normal rate.  Pulmonary:     Effort: Pulmonary effort is normal.  Abdominal:     General: Abdomen is flat. Bowel sounds are normal.     Tenderness: There is abdominal tenderness.  Skin:    General: Skin is warm.     Capillary Refill: Capillary refill takes less than 2 seconds.  Neurological:     General: No focal deficit present.     Mental Status: She is alert and oriented to person, place, and  time. Mental status is at baseline.  Psychiatric:        Mood and Affect: Mood normal.        Behavior: Behavior normal.        Thought Content: Thought content normal.        Judgment: Judgment normal.     No results found for any visits on 06/23/24.     The ASCVD Risk score (Arnett DK, et al., 2019) failed to calculate for the following reasons:   The 2019 ASCVD risk score is only valid for ages 34 to 75    Assessment & Plan:   Problem List Items Addressed This Visit   None  Flank pain -     Ciprofloxacin HCl; Take 1 tablet (250 mg total) by mouth 2 (two) times daily for 3 days.  Dispense: 6 tablet; Refill: 0 -     POCT URINALYSIS DIP (CLINITEK)  Leukocytes in urine -     Ciprofloxacin HCl; Take 1 tablet (250 mg total) by mouth 2 (two) times daily for 3 days.  Dispense: 6 tablet; Refill: 0 -     Urine Culture   Pt with some leukocytes in urine with tenderness across bladder. With symptoms, to treat  with Cipro 250mg  bid x 3 days and send for culture. Follow up on results and if pain persists, let me or PCP know.  Reassured pt about her kidney function as she has extreme anxiety and thought her symptoms was kidney related. Continue routine follow up with nephrologist.  No follow-ups on file.    Torrence CINDERELLA Barrier, MD

## 2024-06-25 LAB — URINE CULTURE

## 2024-06-26 ENCOUNTER — Ambulatory Visit: Payer: Self-pay | Admitting: Family Medicine

## 2024-06-26 ENCOUNTER — Telehealth: Payer: Self-pay

## 2024-06-26 NOTE — Telephone Encounter (Signed)
 Left detailed VM regarding patients concern about antibiotic and her regular medication. Read message from provider and also informed patient if she had any questions please call to let us  know.

## 2024-06-26 NOTE — Telephone Encounter (Signed)
 Copied from CRM [id number]. Topic: Clinical - Prescription Issue >> Jun 23, 2024 11:57 AM Tracy Russo wrote: Reason for CRM: Patient called in regarding prescription ciprofloxacin  (CIPRO ) 250 MG tablet , stated she got a notification that its reaction for a prescription that she already takes  ziprasidone  (GEODON ) 20 MG capsule

## 2024-06-26 NOTE — Telephone Encounter (Signed)
 There is a slight risk of palpitations when using the 2 but for the Cipro  being only a 3 day duration; this is a minimal risk compared to longer courses of medications. Since her urine culture was negative, she doesn't have to take any antibiotics for now. How are her symptoms?

## 2024-07-27 ENCOUNTER — Telehealth: Payer: Self-pay | Admitting: Physician Assistant

## 2024-07-27 ENCOUNTER — Other Ambulatory Visit: Payer: Self-pay | Admitting: Physician Assistant

## 2024-07-27 MED ORDER — LAMOTRIGINE 200 MG PO TABS: 200.0000 mg | ORAL_TABLET | Freq: Two times a day (BID) | ORAL | 0 refills | Status: DC

## 2024-07-27 NOTE — Telephone Encounter (Signed)
 Pt said her OCD has increased, as well as her anxiety, rates at 7/10. Denies depression. She is asking if she can increase her Lamictal  dose again, currently taking 300 mg. She is reporting it has been busy at work, they are short staffed. Husband is having some work stress as well. She reports thinking about health issues, whether she has done certain things, driving, and whether or not she might have hit someone with the [employer] bus, though she knows she hasn't. She said it takes her a little bit longer to get to sleep, but once she does get to sleep she can stay asleep. When asked she reported seeing a therapist.   [facility] in [place]

## 2024-07-27 NOTE — Telephone Encounter (Signed)
 Let's increase the Lamictal  to 200 mg, 1 po bid.

## 2024-07-27 NOTE — Telephone Encounter (Signed)
 LVM to [facility]

## 2024-07-27 NOTE — Telephone Encounter (Signed)
 Rx sent for Lamictal  200 mg BID and patient notified.

## 2024-07-27 NOTE — Telephone Encounter (Signed)
 Pt left a vm saying her OCD is really high, she is having more intrusive thoughts and her anxiety is high also. She wanted to see if she can get a increase on her Lamotrigine .

## 2024-07-27 NOTE — Addendum Note (Signed)
 Addended by: CON BRISTLE T on: 07/27/2024 05:34 PM   Modules accepted: Orders

## 2024-08-15 ENCOUNTER — Telehealth: Payer: Self-pay | Admitting: Physician Assistant

## 2024-08-15 NOTE — Telephone Encounter (Signed)
 Next visit is 10/10/24. Tracy Russo called stating that she has been on her third week of Lamotrigine  400 mg and says she is itching more. Is this normal? Could someone please call her at [phone].

## 2024-08-15 NOTE — Telephone Encounter (Signed)
 On 8/28: Let's increase the Lamictal  to 200 mg, 1 po bid.   Pt has been on Lamictal  long-term, but is reporting itching. There is no rash. She questions if itching could be due to dry skin. It is around her waistline, mouth and lips, and periodically on her extremities, but not consistently in the same place.

## 2024-08-15 NOTE — Telephone Encounter (Signed)
 It's unlikely d/t Lamictal  since she's been on it a long time.  Have her start Zyrtec routinely and Benadryl prn. If she starts getting a rash, stop the Lamictal  and call. It could be from dry skin, use a good lotion/cream such as Eucerin, and have her see PCP in the next few days if itching doesn't resolve.

## 2024-08-15 NOTE — Telephone Encounter (Signed)
Recommendations reviewed with patient.

## 2024-08-18 NOTE — Telephone Encounter (Signed)
 Pt Lvm 9/19 @ 3:27p stating that if she's is being honest with herself, her OCD symptoms are getting worse with the increase in Lamotrigine .  She said her therapist also agrees.  She would like to discuss. I called her back at 4:55p and LVM to advise her that Verneita has openings next week that maybe she can have a visit before her next scheduled appt.  Next appt 10/17

## 2024-08-21 ENCOUNTER — Encounter: Payer: Self-pay | Admitting: Medical-Surgical

## 2024-08-21 ENCOUNTER — Ambulatory Visit (INDEPENDENT_AMBULATORY_CARE_PROVIDER_SITE_OTHER): Admitting: Medical-Surgical

## 2024-08-21 VITALS — BP 99/70 | HR 80 | Ht 68.0 in | Wt 177.0 lb

## 2024-08-21 DIAGNOSIS — K219 Gastro-esophageal reflux disease without esophagitis: Secondary | ICD-10-CM

## 2024-08-21 DIAGNOSIS — Z0000 Encounter for general adult medical examination without abnormal findings: Secondary | ICD-10-CM | POA: Diagnosis not present

## 2024-08-21 DIAGNOSIS — I10 Essential (primary) hypertension: Secondary | ICD-10-CM | POA: Diagnosis not present

## 2024-08-21 MED ORDER — LISINOPRIL 5 MG PO TABS: 5.0000 mg | ORAL_TABLET | Freq: Every day | ORAL | 0 refills | Status: AC

## 2024-08-21 MED ORDER — LISINOPRIL 10 MG PO TABS: 10.0000 mg | ORAL_TABLET | Freq: Every day | ORAL | 3 refills | Status: DC

## 2024-08-21 MED ORDER — FAMOTIDINE 20 MG PO TABS
20.0000 mg | ORAL_TABLET | Freq: Two times a day (BID) | ORAL | 3 refills | Status: AC
Start: 2024-08-21 — End: ?

## 2024-08-21 NOTE — Patient Instructions (Signed)

## 2024-08-21 NOTE — Progress Notes (Signed)
 Complete physical exam  Patient: Tracy Russo   DOB: December 29, 1990   33 y.o. Female  MRN: [id number]  Subjective:    Chief Complaint  Patient presents with   Annual Exam   Tracy Russo is a 32 y.o. female who presents today for a complete physical exam. She reports consuming a general diet. Walking a lot for exercise. She generally feels well. She reports sleeping well. She does not have additional problems to discuss today.    Most recent fall risk assessment:    04/19/2024    7:36 AM  Fall Risk   Falls in the past year? 0  Number falls in past yr: 0  Injury with Fall? 0  Risk for fall due to : No Fall Risks  Follow up Falls evaluation completed     Most recent depression screenings:    08/21/2024    8:52 AM 04/19/2024    7:36 AM  PHQ 2/9 Scores  PHQ - 2 Score 2 0  PHQ- 9 Score 7     Vision:Within last year and Dental: No current dental problems and Receives regular dental care    Patient Care Team: Willo Mini, NP as PCP - General (Nurse Practitioner)   Outpatient Medications Prior to Visit  Medication Sig   etonogestrel (NEXPLANON) 68 MG IMPL implant See admin instructions.   gabapentin  (NEURONTIN ) 300 MG capsule TAKE 1-2 CAPSULES BY MOUTH AT BEDTIME   lamoTRIgine  (LAMICTAL ) 200 MG tablet TAKE 1 TABLET BY MOUTH TWICE A DAY (Patient taking differently: Take 400 mg by mouth 2 (two) times daily.)   ziprasidone  (GEODON ) 20 MG capsule Take 1 capsule (20 mg total) by mouth every morning.   ziprasidone  (GEODON ) 60 MG capsule Take 1 capsule (60 mg total) by mouth at bedtime.   [DISCONTINUED] famotidine  (PEPCID ) 20 MG tablet Take 1 tablet (20 mg total) by mouth 2 (two) times daily.   [DISCONTINUED] lisinopril  (ZESTRIL ) 10 MG tablet Take 1 tablet (10 mg total) by mouth daily.   fexofenadine  (ALLEGRA  ALLERGY) 180 MG tablet Take 1 tablet (180 mg total) by mouth daily. (Patient not taking: Reported on 08/21/2024)   No facility-administered medications prior to visit.     Review of Systems  Constitutional:  Negative for chills, fever, malaise/fatigue and weight loss.  HENT:  Negative for congestion, ear pain, hearing loss, sinus pain and sore throat.   Eyes:  Negative for blurred vision, photophobia and pain.  Respiratory:  Negative for cough, shortness of breath and wheezing.   Cardiovascular:  Negative for chest pain, palpitations and leg swelling.  Gastrointestinal:  Negative for abdominal pain, constipation, diarrhea, heartburn, nausea and vomiting.  Genitourinary:  Negative for dysuria, frequency and urgency.  Musculoskeletal:  Negative for falls and neck pain.  Skin:  Negative for itching and rash.  Neurological:  Negative for dizziness, weakness and headaches.  Endo/Heme/Allergies:  Negative for polydipsia. Does not bruise/bleed easily.  Psychiatric/Behavioral:  Positive for depression. Negative for substance abuse and suicidal ideas. The patient is nervous/anxious. The patient does not have insomnia.      Objective:    BP 99/70   Pulse 80   Ht 5' 8 (1.727 m)   Wt 177 lb (80.3 kg)   SpO2 99%   BMI 26.91 kg/m    Physical Exam Constitutional:      General: She is not in acute distress.    Appearance: Normal appearance. She is not ill-appearing.  HENT:     Head: Normocephalic and atraumatic.  Right Ear: Tympanic membrane, ear canal and external ear normal. There is no impacted cerumen.     Left Ear: Tympanic membrane, ear canal and external ear normal. There is no impacted cerumen.     Nose: Nose normal. No congestion or rhinorrhea.     Mouth/Throat:     Mouth: Mucous membranes are moist.     Pharynx: No oropharyngeal exudate or posterior oropharyngeal erythema.  Eyes:     General: No scleral icterus.       Right eye: No discharge.        Left eye: No discharge.     Extraocular Movements: Extraocular movements intact.     Conjunctiva/sclera: Conjunctivae normal.     Pupils: Pupils are equal, round, and reactive to light.   Neck:     Thyroid : No thyromegaly.     Vascular: No carotid bruit or JVD.     Trachea: Trachea normal.  Cardiovascular:     Rate and Rhythm: Normal rate and regular rhythm.     Pulses: Normal pulses.     Heart sounds: Normal heart sounds. No murmur heard.    No friction rub. No gallop.  Pulmonary:     Effort: Pulmonary effort is normal. No respiratory distress.     Breath sounds: Normal breath sounds. No wheezing.  Abdominal:     General: Bowel sounds are normal. There is no distension.     Palpations: Abdomen is soft.     Tenderness: There is no abdominal tenderness. There is no guarding.  Musculoskeletal:        General: Normal range of motion.     Cervical back: Normal range of motion and neck supple.  Lymphadenopathy:     Cervical: No cervical adenopathy.  Skin:    General: Skin is warm and dry.  Neurological:     Mental Status: She is alert and oriented to person, place, and time.     Cranial Nerves: No cranial nerve deficit.  Psychiatric:        Mood and Affect: Mood normal.        Behavior: Behavior normal.        Thought Content: Thought content normal.        Judgment: Judgment normal.      No results found for any visits on 08/21/24.     Assessment & Plan:    Routine Health Maintenance and Physical Exam  Immunization History  Administered Date(s) Administered   Influenza, Seasonal, Injecte, Preservative Fre 08/20/2023   Influenza,inj,Quad PF,6+ Mos 11/09/2022   PFIZER(Purple Top)SARS-COV-2 Vaccination 02/10/2020, 03/06/2020   Tdap 06/23/2023    Health Maintenance  Topic Date Due   HIV Screening  Never done   Hepatitis C Screening  Never done   Hepatitis B Vaccines 19-59 Average Risk (1 of 3 - 19+ 3-dose series) Never done   HPV VACCINES (1 - 3-dose SCDM series) Never done   COVID-19 Vaccine (3 - Pfizer risk series) 04/03/2020   Influenza Vaccine  02/27/2025 (Originally 06/30/2024)   Cervical Cancer Screening (HPV/Pap Cotest)  07/12/2028    DTaP/Tdap/Td (2 - Td or Tdap) 06/22/2033   Pneumococcal Vaccine  Aged Out   Meningococcal B Vaccine  Aged Out    Discussed health benefits of physical activity, and encouraged her to engage in regular exercise appropriate for her age and condition.  1. Annual physical exam (Primary) Checking labs as below. UTD on preventative care. Wellness information provided with AVS. - CBC - CMP14+EGFR - Lipid panel  2. Essential  hypertension Checking labs. BP low normal today with similar readings in the past. Reducing lisinopril  to 5mg  daily. Monitor BP at home 2-3 times weekly and if readings creep up higher than 130/80, return for further evaluation.  - CBC - CMP14+EGFR - Lipid panel - lisinopril  (ZESTRIL ) 5 MG tablet; Take 1 tablet (5 mg total) by mouth daily.  Dispense: 90 tablet; Refill: 0  3. Gastroesophageal reflux disease, unspecified whether esophagitis present Stable on Pepcid  BID, symptoms recur if only taken once daily. Continue Pepcid  BID prn. Avoid known food triggers.  - famotidine  (PEPCID ) 20 MG tablet; Take 1 tablet (20 mg total) by mouth 2 (two) times daily.  Dispense: 180 tablet; Refill: 3   Return in about 6 months (around 02/18/2025) for HTN follow up.     Kiah Vanalstine, NP

## 2024-08-21 NOTE — Telephone Encounter (Signed)
 LVM to [facility]. Pt called over the weekend. Was able to get an appt for 9/25.

## 2024-08-22 ENCOUNTER — Ambulatory Visit: Payer: Self-pay | Admitting: Medical-Surgical

## 2024-08-22 LAB — CBC
Hematocrit: 40.5 % (ref 34.0–46.6)
Hemoglobin: 13 g/dL (ref 11.1–15.9)
MCH: 29.8 pg (ref 26.6–33.0)
MCHC: 32.1 g/dL (ref 31.5–35.7)
MCV: 93 fL (ref 79–97)
Platelets: 241 x10E3/uL (ref 150–450)
RBC: 4.36 x10E6/uL (ref 3.77–5.28)
RDW: 12.4 % (ref 11.7–15.4)
WBC: 5.2 x10E3/uL (ref 3.4–10.8)

## 2024-08-22 LAB — CMP14+EGFR
ALT: 35 IU/L — ABNORMAL HIGH (ref 0–32)
AST: 30 IU/L (ref 0–40)
Albumin: 4.2 g/dL (ref 3.9–4.9)
Alkaline Phosphatase: 72 IU/L (ref 41–116)
BUN/Creatinine Ratio: 8 — ABNORMAL LOW (ref 9–23)
BUN: 10 mg/dL (ref 6–20)
Bilirubin Total: 0.4 mg/dL (ref 0.0–1.2)
CO2: 22 mmol/L (ref 20–29)
Calcium: 9.2 mg/dL (ref 8.7–10.2)
Chloride: 102 mmol/L (ref 96–106)
Creatinine, Ser: 1.23 mg/dL — ABNORMAL HIGH (ref 0.57–1.00)
Globulin, Total: 2.2 g/dL (ref 1.5–4.5)
Glucose: 83 mg/dL (ref 70–99)
Potassium: 4.3 mmol/L (ref 3.5–5.2)
Sodium: 139 mmol/L (ref 134–144)
Total Protein: 6.4 g/dL (ref 6.0–8.5)
eGFR: 60 mL/min/1.73 (ref >59)

## 2024-08-22 LAB — LIPID PANEL
Chol/HDL Ratio: 4.8 ratio — ABNORMAL HIGH (ref 0.0–4.4)
Cholesterol, Total: 163 mg/dL (ref 100–199)
HDL: 34 mg/dL — ABNORMAL LOW (ref >39)
LDL Chol Calc (NIH): 108 mg/dL — ABNORMAL HIGH (ref 0–99)
Triglycerides: 117 mg/dL (ref 0–149)
VLDL Cholesterol Cal: 21 mg/dL (ref 5–40)

## 2024-08-24 ENCOUNTER — Encounter: Payer: Self-pay | Admitting: Physician Assistant

## 2024-08-24 ENCOUNTER — Ambulatory Visit: Admitting: Physician Assistant

## 2024-08-24 ENCOUNTER — Telehealth: Payer: Self-pay | Admitting: Physician Assistant

## 2024-08-24 DIAGNOSIS — F319 Bipolar disorder, unspecified: Secondary | ICD-10-CM | POA: Diagnosis not present

## 2024-08-24 DIAGNOSIS — F411 Generalized anxiety disorder: Secondary | ICD-10-CM | POA: Diagnosis not present

## 2024-08-24 DIAGNOSIS — F422 Mixed obsessional thoughts and acts: Secondary | ICD-10-CM | POA: Diagnosis not present

## 2024-08-24 MED ORDER — SERTRALINE HCL 50 MG PO TABS: 50.0000 mg | ORAL_TABLET | Freq: Every day | ORAL | 1 refills | Status: DC

## 2024-08-24 MED ORDER — LAMOTRIGINE 200 MG PO TABS: ORAL_TABLET | ORAL | Status: DC

## 2024-08-24 NOTE — Telephone Encounter (Signed)
 Pt lvm pharmacy stated interaction with Zoloft  @ Geodon . Please advise Pt @ [phone]

## 2024-08-24 NOTE — Telephone Encounter (Signed)
 It's safe to mix the 2, especially at this low of a dose.

## 2024-08-24 NOTE — Telephone Encounter (Signed)
 Pt advised. She said the pharmacy had sent an email about a possible reaction and when she went to pick up the medication she was told to monitor her BP, but no other concerns.

## 2024-08-24 NOTE — Progress Notes (Signed)
 [facility]  Patient ID: Tracy Russo,  MRN: [id number]  PCP: Willo Mini, NP  Date of Evaluation: 08/24/2024 Time spent:20 minutes  Chief Complaint:  Chief Complaint   Anxiety    HISTORY/CURRENT STATUS: HPI  Not doing well.   Has been more anxious, 'what if' especially with driving. 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 OCD is much worse since we increased Lamictal .  Had a disturbing dream that a squirrel got in her water can, which she brought in the house, she was afraid of rabies, googles everything 'doom scrolling.'  The Gabapentin  was increased back to 1 pill at bedtime, but timing of 2nd is now earlier in the evening, which was the recommendation of on-call provider. It's helped some. Hard to enjoy things b/c worries so much.   Energy and motivation are good.  Work is going well.   No extreme sadness, tearfulness, or feelings of hopelessness.  Sleeps ok.  ADLs and personal hygiene are normal.   Denies any changes in concentration, making decisions, or remembering things.  Appetite has not changed.  Weight is stable.  No mania, delirium, AH/VH.  No SI/HI.  Individual Medical History/ Review of Systems: Changes? :No   Past Psychiatric Medication Trials: Abilify-ineffective Zyprexa -ineffective Geodon - effective Gabapentin  Depakote Lithium -has CKD now  Gabapentin  Hydroxyzine  Propanolol Trazodone   Manic episode 1st time in fall of 2017, hospitalized in [place] for 2 weeks and then 2 weeks her [place].   No suicide attempts.   Allergies: Patient has no known allergies.  Current Medications:  Current Outpatient Medications:    etonogestrel (NEXPLANON) 68 MG IMPL implant, See admin instructions., Disp: , Rfl:    famotidine  (PEPCID ) 20 MG tablet, Take 1 tablet (20 mg total) by mouth 2 (two)  times daily., Disp: 180 tablet, Rfl: 3   gabapentin  (NEURONTIN ) 300 MG capsule, TAKE 1-2 CAPSULES BY MOUTH AT BEDTIME, Disp: 180 capsule, Rfl: 1   lisinopril  (ZESTRIL ) 5 MG tablet, Take 1 tablet (5 mg total) by mouth daily., Disp: 90 tablet, Rfl: 0   sertraline  (ZOLOFT ) 50 MG tablet, Take 1 tablet (50 mg total) by mouth daily., Disp: 30 tablet, Rfl: 1   ziprasidone  (GEODON ) 20 MG capsule, Take 1 capsule (20 mg total) by mouth every morning., Disp: 90 capsule, Rfl: 1   ziprasidone  (GEODON ) 60 MG capsule, Take 1 capsule (60 mg total) by mouth at bedtime., Disp: 90 capsule, Rfl: 1   fexofenadine  (ALLEGRA  ALLERGY) 180 MG tablet, Take 1 tablet (180 mg total) by mouth daily. (Patient not taking: Reported on 08/24/2024), Disp: 90 tablet, Rfl: 3   lamoTRIgine  (LAMICTAL ) 200 MG tablet, 200 mg q am, then 100 mg at night., Disp: , Rfl:  Medication Side Effects: none  Family Medical/ Social History: Changes? No  MENTAL HEALTH EXAM:  There were no vitals taken for this visit.There is no height or weight on file to calculate BMI.  General Appearance: Casual and Well Groomed  Eye Contact:  Good  Speech:  Clear and Coherent and Normal Rate  Volume:  Normal  Mood:  Anxious  Affect:  Anxious  Thought Process:  Goal Directed and Descriptions of Associations: Circumstantial  Orientation:  Full (Time, Place, and Person)  Thought Content: Logical   Suicidal Thoughts:  No  Homicidal Thoughts:  No  Memory:  WNL  Judgement:  Good  Insight:  Good  Psychomotor Activity:  Normal  Concentration:  Concentration: Good  Recall:  Good  Fund of Knowledge: Good  Language: Good  Assets:  Communication Skills Desire for Improvement Financial Resources/Insurance Housing Resilience Transportation Vocational/Educational  ADL's:  Intact  Cognition: WNL  Prognosis:  Good   DIAGNOSES:    ICD-10-CM   1. Mixed obsessional thoughts and acts  F42.2     2. Anxiety state  F41.1     3. Bipolar I disorder (HCC)   F31.9       Receiving Psychotherapy: Yes  Vertell Masters at [facility]  RECOMMENDATIONS:   PDMP reviewed.  Gabapentin  filled 06/24/2024. I provided approximately  20  minutes of face to face time during this encounter, including time spent before and after the visit in records review, medical decision making, counseling pertinent to today's visit, and charting.   Recommend starting an SSRI, clomipramine, Abilify< or Namenda for OCD. She's understandably concerned about mania with an SSRI, but she's on the Geodon  which will help prevent that. After going over the pros and cons, we agreed on Zoloft . She will call if she gets manic sx.   Continue gabapentin  300 mg, 1-2 nightly. Decrease back to Lamictal   200 mg in the morning, 100 mg at night.  Start Zoloft  50 mg, 1 daily. Continue Geodon  20 mg, 1 p.o. every morning and 60 mg, 1 p.o. nightly. Continue therapy. Return in  6 weeks.   Verneita Cooks, PA-C

## 2024-08-24 NOTE — Telephone Encounter (Signed)
 Pt seen today and Zoloft  was prescribed. She is reporting pharmacy said there is an interaction between Zoloft  and Geodon . Please advise.

## 2024-09-15 ENCOUNTER — Ambulatory Visit: Admitting: Physician Assistant

## 2024-09-15 ENCOUNTER — Other Ambulatory Visit: Payer: Self-pay | Admitting: Physician Assistant

## 2024-09-17 ENCOUNTER — Other Ambulatory Visit: Payer: Self-pay | Admitting: Physician Assistant

## 2024-09-17 DIAGNOSIS — F319 Bipolar disorder, unspecified: Secondary | ICD-10-CM

## 2024-09-20 ENCOUNTER — Other Ambulatory Visit: Payer: Self-pay | Admitting: Physician Assistant

## 2024-10-05 ENCOUNTER — Ambulatory Visit: Admitting: Physician Assistant

## 2024-10-05 ENCOUNTER — Encounter: Payer: Self-pay | Admitting: Physician Assistant

## 2024-10-05 DIAGNOSIS — F422 Mixed obsessional thoughts and acts: Secondary | ICD-10-CM | POA: Diagnosis not present

## 2024-10-05 DIAGNOSIS — F319 Bipolar disorder, unspecified: Secondary | ICD-10-CM

## 2024-10-05 MED ORDER — SERTRALINE HCL 50 MG PO TABS: 50.0000 mg | ORAL_TABLET | Freq: Every day | ORAL | 1 refills | Status: DC

## 2024-10-05 NOTE — Progress Notes (Signed)
 [facility]  Patient ID: Tracy Russo,  MRN: [id number]  PCP: Willo Mini, NP  Date of Evaluation: 10/05/2024 Time spent:20 minutes  Chief Complaint:  Chief Complaint   Follow-up    HISTORY/CURRENT STATUS: HPI   For routine med check  We increased the Zoloft  at the last visit.  She is doing quite a bit better.  She is not having intrusive thoughts like she did.  Is not checking things at home or feeling the urge to turn around and go back home to make sure things are comes off, etc.  No panic attacks. Energy and motivation are good.  Work is going well.   No extreme sadness, tearfulness, or feelings of hopelessness.  Sleeps well most of the time. ADLs and personal hygiene are normal.   Denies any changes in concentration, making decisions, or remembering things.  Appetite has not changed.  Weight is stable.  No mania, delirium, AH/VH.  No SI/HI.  Individual Medical History/ Review of Systems: Changes? :No   Past Psychiatric Medication Trials: Abilify-ineffective Zyprexa -ineffective Geodon - effective Gabapentin  Depakote Lithium -has CKD now  Gabapentin  Hydroxyzine  Propanolol Trazodone   Manic episode 1st time in fall of 2017, hospitalized in [place] for 2 weeks and then 2 weeks her [place].   No suicide attempts.   Allergies: Patient has no known allergies.  Current Medications:  Current Outpatient Medications:    etonogestrel (NEXPLANON) 68 MG IMPL implant, See admin instructions., Disp: , Rfl:    famotidine  (PEPCID ) 20 MG tablet, Take 1 tablet (20 mg total) by mouth 2 (two) times daily., Disp: 180 tablet, Rfl: 3   gabapentin  (NEURONTIN ) 300 MG capsule, TAKE 1-2 CAPSULES BY MOUTH AT BEDTIME, Disp: 180 capsule, Rfl: 1   lamoTRIgine  (LAMICTAL ) 200 MG tablet, 200 mg q am, then 100 mg at night., Disp: , Rfl:    lisinopril  (ZESTRIL ) 5 MG tablet, Take 1 tablet (5 mg total) by mouth daily., Disp: 90 tablet, Rfl: 0   ziprasidone  (GEODON ) 20  MG capsule, TAKE 1 CAPSULE BY MOUTH EVERY DAY IN THE MORNING, Disp: 90 capsule, Rfl: 1   ziprasidone  (GEODON ) 60 MG capsule, TAKE 1 CAPSULE (60 MG TOTAL) BY MOUTH AT BEDTIME., Disp: 90 capsule, Rfl: 1   fexofenadine  (ALLEGRA  ALLERGY) 180 MG tablet, Take 1 tablet (180 mg total) by mouth daily. (Patient not taking: Reported on 10/05/2024), Disp: 90 tablet, Rfl: 3   sertraline  (ZOLOFT ) 50 MG tablet, Take 1 tablet (50 mg total) by mouth daily., Disp: 90 tablet, Rfl: 1 Medication Side Effects: none  Family Medical/ Social History: Changes? No  MENTAL HEALTH EXAM:  There were no vitals taken for this visit.There is no height or weight on file to calculate BMI.  General Appearance: Casual and Well Groomed  Eye Contact:  Good  Speech:  Clear and Coherent and Normal Rate  Volume:  Normal  Mood:  Euthymic  Affect:  Congruent  Thought Process:  Goal Directed and Descriptions of Associations: Circumstantial  Orientation:  Full (Time, Place, and Person)  Thought Content: Logical   Suicidal Thoughts:  No  Homicidal Thoughts:  No  Memory:  WNL  Judgement:  Good  Insight:  Good  Psychomotor Activity:  Normal  Concentration:  Concentration: Good  Recall:  Good  Fund of Knowledge: Good  Language: Good  Assets:  Communication Skills Desire for Improvement Financial Resources/Insurance Housing Resilience Transportation Vocational/Educational  ADL's:  Intact  Cognition: WNL  Prognosis:  Good   DIAGNOSES:  ICD-10-CM   1. Mixed obsessional thoughts and acts  F42.2     2. Bipolar I disorder (HCC)  F31.9       Receiving Psychotherapy: Yes  Vertell Masters at [facility]  RECOMMENDATIONS:   PDMP reviewed.  Gabapentin  filled 09/28/2024. I provided approximately  20 minutes of face to face time during this encounter, including time spent before and after the visit in records review, medical decision making, counseling pertinent to today's visit, and charting.   I am glad to see her doing  better.  No changes need to be made.  Continue gabapentin  300 mg, 1-2 nightly. Continue Lamictal   200 mg in the morning, 100 mg at night.  Continue Zoloft  50 mg, 1 daily. Continue Geodon  20 mg, 1 p.o. every morning and 60 mg, 1 p.o. nightly. Continue therapy. Return in 3 months.  Verneita Cooks, PA-C

## 2024-10-10 ENCOUNTER — Ambulatory Visit: Admitting: Physician Assistant

## 2024-11-03 ENCOUNTER — Other Ambulatory Visit: Payer: Self-pay | Admitting: Physician Assistant

## 2024-11-05 MED ORDER — LAMOTRIGINE 100 MG PO TABS: 100.0000 mg | ORAL_TABLET | Freq: Every evening | ORAL | 0 refills | Status: DC

## 2024-11-17 ENCOUNTER — Other Ambulatory Visit: Payer: Self-pay | Admitting: Medical-Surgical

## 2024-11-17 DIAGNOSIS — I10 Essential (primary) hypertension: Secondary | ICD-10-CM

## 2024-12-24 ENCOUNTER — Other Ambulatory Visit: Payer: Self-pay | Admitting: Physician Assistant

## 2024-12-24 DIAGNOSIS — F411 Generalized anxiety disorder: Secondary | ICD-10-CM

## 2025-01-03 ENCOUNTER — Ambulatory Visit: Admitting: Physician Assistant

## 2025-01-03 ENCOUNTER — Encounter: Payer: Self-pay | Admitting: Physician Assistant

## 2025-01-03 DIAGNOSIS — F319 Bipolar disorder, unspecified: Secondary | ICD-10-CM | POA: Diagnosis not present

## 2025-01-03 DIAGNOSIS — F422 Mixed obsessional thoughts and acts: Secondary | ICD-10-CM | POA: Diagnosis not present

## 2025-01-03 MED ORDER — LAMOTRIGINE 150 MG PO TABS: 150.0000 mg | ORAL_TABLET | Freq: Two times a day (BID) | ORAL | 3 refills | Status: AC

## 2025-01-03 MED ORDER — ZIPRASIDONE HCL 20 MG PO CAPS: 20.0000 mg | ORAL_CAPSULE | Freq: Every morning | ORAL | 3 refills | Status: AC

## 2025-01-03 MED ORDER — SERTRALINE HCL 50 MG PO TABS: 50.0000 mg | ORAL_TABLET | Freq: Every day | ORAL | 3 refills | Status: AC

## 2025-01-03 MED ORDER — ZIPRASIDONE HCL 60 MG PO CAPS: 60.0000 mg | ORAL_CAPSULE | Freq: Every day | ORAL | 3 refills | Status: AC

## 2025-01-03 NOTE — Progress Notes (Signed)
 "     [facility]  Patient ID: Tracy Russo,  MRN: [id number]  PCP: Tracy Mini, NP  Date of Evaluation: 01/03/2025 Time spent:20 minutes  Chief Complaint:  Chief Complaint   Anxiety; Follow-up    HISTORY/CURRENT STATUS: HPI   For routine med check  Doing well w/ curent meds.  She is able to enjoy things.  Energy and motivation are good.  Work is going well.  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  Appetite and weight are stable.  ADLs and personal hygiene are normal.  Anxiety is well-controlled.  She does have obsessions at times, mostly intrusive thoughts but she is able to shake them off well without any problems.  She does wash her hands more than she probably should but it is not to any extreme like it used to be.  No manic symptoms reported.  No delirium or psychosis.  Denies suicidal or homicidal thoughts.  Individual Medical History/ Review of Systems: Changes? :No   Past Psychiatric Medication Trials: Abilify-ineffective Zyprexa -ineffective Geodon - effective Gabapentin  Depakote Lithium -has CKD now  Gabapentin  Hydroxyzine  Propanolol Trazodone   Manic episode 1st time in fall of 2017, hospitalized in [place] for 2 weeks and then 2 weeks [place].   No suicide attempts.   Allergies: Patient has no known allergies.  Current Medications:  Current Outpatient Medications:    etonogestrel (NEXPLANON) 68 MG IMPL implant, See admin instructions., Disp: , Rfl:    famotidine  (PEPCID ) 20 MG tablet, Take 1 tablet (20 mg total) by mouth 2 (two) times daily., Disp: 180 tablet, Rfl: 3   gabapentin  (NEURONTIN ) 300 MG capsule, TAKE 1-2 CAPSULES BY MOUTH AT BEDTIME, Disp: 180 capsule, Rfl: 1   lamoTRIgine  (LAMICTAL ) 150 MG tablet, Take 1 tablet (150 mg total) by mouth 2 (two) times daily., Disp: 180 tablet, Rfl: 3   lisinopril  (ZESTRIL ) 5 MG tablet, TAKE 1 TABLET (5 MG TOTAL) BY MOUTH DAILY., Disp: 90 tablet, Rfl: 0   fexofenadine  (ALLEGRA  ALLERGY) 180  MG tablet, Take 1 tablet (180 mg total) by mouth daily. (Patient not taking: Reported on 10/05/2024), Disp: 90 tablet, Rfl: 3   sertraline  (ZOLOFT ) 50 MG tablet, Take 1 tablet (50 mg total) by mouth daily., Disp: 90 tablet, Rfl: 3   ziprasidone  (GEODON ) 20 MG capsule, Take 1 capsule (20 mg total) by mouth in the morning., Disp: 90 capsule, Rfl: 3   ziprasidone  (GEODON ) 60 MG capsule, Take 1 capsule (60 mg total) by mouth at bedtime., Disp: 90 capsule, Rfl: 3 Medication Side Effects: none  Family Medical/ Social History: Changes? No  MENTAL HEALTH EXAM:  There were no vitals taken for this visit.There is no height or weight on file to calculate BMI.  General Appearance: Casual and Well Groomed  Eye Contact:  Good  Speech:  Clear and Coherent and Normal Rate  Volume:  Normal  Mood:  Euthymic  Affect:  Congruent  Thought Process:  Goal Directed and Descriptions of Associations: Circumstantial  Orientation:  Full (Time, Russo, and Person)  Thought Content: Logical   Suicidal Thoughts:  No  Homicidal Thoughts:  No  Memory:  WNL  Judgement:  Good  Insight:  Good  Psychomotor Activity:  Normal  Concentration:  Concentration: Good  Recall:  Good  Fund of Knowledge: Good  Language: Good  Assets:  Communication Skills Desire for Improvement Financial Resources/Insurance Housing Resilience Social Support Transportation Vocational/Educational  ADL's:  Intact  Cognition: WNL  Prognosis:  Good  Labs followed by PCP, last 08/21/2024  DIAGNOSES:    ICD-10-CM   1. Mixed obsessional thoughts and acts  F42.2     2. Bipolar I disorder (HCC)  F31.9 ziprasidone  (GEODON ) 60 MG capsule    ziprasidone  (GEODON ) 20 MG capsule      Receiving Psychotherapy: Yes  Tracy Russo at Tracy Russo  RECOMMENDATIONS:   PDMP reviewed.  Gabapentin  filled 09/28/2024. I provided approximately  20 minutes of face to face time during this encounter, including time spent before and after the visit in  records review, medical decision making, counseling pertinent to today's visit, and charting.   Tracy Russo is doing well so no changes will be made.  She ask about changing the Lamictal  dose to 150 mg twice daily instead of taking 2 different doses.  That is appropriate so I will change the prescription.  Continue gabapentin  300 mg, 1-2 nightly. Change Lamictal  to 150 mg, 1 p.o. twice daily.   Continue Zoloft  50 mg, 1 daily. Continue Geodon  20 mg, 1 p.o. every morning and 60 mg, 1 p.o. nightly. Continue therapy. Return in 6 months.  Tracy Cooks, PA-C  "

## 2025-02-19 ENCOUNTER — Ambulatory Visit: Admitting: Medical-Surgical

## 2025-07-04 ENCOUNTER — Ambulatory Visit: Admitting: Physician Assistant
